# Patient Record
Sex: Female | Born: 1970
Health system: Southern US, Community
[De-identification: ages and names within clinical notes are randomized; demographics above are authoritative.]

## PROBLEM LIST (undated history)

## (undated) DIAGNOSIS — E0590 Thyrotoxicosis, unspecified without thyrotoxic crisis or storm: Secondary | ICD-10-CM

## (undated) DIAGNOSIS — IMO0002 Reserved for concepts with insufficient information to code with codable children: Secondary | ICD-10-CM

## (undated) DIAGNOSIS — M359 Systemic involvement of connective tissue, unspecified: Secondary | ICD-10-CM

## (undated) DIAGNOSIS — L308 Other specified dermatitis: Secondary | ICD-10-CM

## (undated) DIAGNOSIS — M329 Systemic lupus erythematosus, unspecified: Secondary | ICD-10-CM

## (undated) DIAGNOSIS — F419 Anxiety disorder, unspecified: Secondary | ICD-10-CM

## (undated) DIAGNOSIS — E0500 Thyrotoxicosis with diffuse goiter without thyrotoxic crisis or storm: Secondary | ICD-10-CM

## (undated) DIAGNOSIS — G4730 Sleep apnea, unspecified: Secondary | ICD-10-CM

## (undated) DIAGNOSIS — T7840XA Allergy, unspecified, initial encounter: Secondary | ICD-10-CM

## (undated) HISTORY — DX: Anxiety disorder, unspecified: F41.9

## (undated) HISTORY — PX: BREAST REDUCTION SURGERY: SHX8

## (undated) HISTORY — PX: EYE SURGERY: SHX253

## (undated) HISTORY — DX: Thyrotoxicosis with diffuse goiter without thyrotoxic crisis or storm: E05.00

## (undated) HISTORY — PX: ABDOMINAL HYSTERECTOMY: SHX81

## (undated) HISTORY — DX: Allergy, unspecified, initial encounter: T78.40XA

## (undated) HISTORY — PX: BREAST SURGERY: SHX581

## (undated) HISTORY — DX: Sleep apnea, unspecified: G47.30

## (undated) HISTORY — DX: Systemic involvement of connective tissue, unspecified: M35.9

## (undated) HISTORY — PX: KNEE ARTHROSCOPY: SUR90

## (undated) HISTORY — DX: Other specified dermatitis: L30.8

## (undated) HISTORY — PX: TUBAL LIGATION: SHX77

## (undated) HISTORY — PX: HEMORRHOID BANDING: SHX5850

---

## 1996-01-30 HISTORY — PX: SHOULDER SURGERY: SHX246

## 2000-01-14 LAB — HM COLONOSCOPY

## 2002-01-09 ENCOUNTER — Encounter: Payer: Self-pay | Admitting: Emergency Medicine

## 2002-01-09 ENCOUNTER — Emergency Department (HOSPITAL_COMMUNITY): Admission: EM | Admit: 2002-01-09 | Discharge: 2002-01-09 | Payer: Self-pay | Admitting: Emergency Medicine

## 2004-01-21 ENCOUNTER — Ambulatory Visit: Payer: Self-pay | Admitting: Family Medicine

## 2004-10-27 ENCOUNTER — Encounter: Payer: Self-pay | Admitting: Family Medicine

## 2004-10-27 ENCOUNTER — Other Ambulatory Visit: Admission: RE | Admit: 2004-10-27 | Discharge: 2004-10-27 | Payer: Self-pay | Admitting: Family Medicine

## 2004-10-27 ENCOUNTER — Ambulatory Visit: Payer: Self-pay | Admitting: Family Medicine

## 2004-11-01 ENCOUNTER — Ambulatory Visit: Payer: Self-pay | Admitting: Family Medicine

## 2004-12-28 ENCOUNTER — Ambulatory Visit: Payer: Self-pay | Admitting: Family Medicine

## 2005-03-23 ENCOUNTER — Ambulatory Visit: Payer: Self-pay | Admitting: Family Medicine

## 2005-03-30 ENCOUNTER — Emergency Department: Payer: Self-pay | Admitting: Emergency Medicine

## 2005-03-30 ENCOUNTER — Other Ambulatory Visit: Payer: Self-pay

## 2005-05-31 ENCOUNTER — Ambulatory Visit: Payer: Self-pay | Admitting: Family Medicine

## 2005-06-07 ENCOUNTER — Ambulatory Visit: Payer: Self-pay | Admitting: Family Medicine

## 2005-06-08 ENCOUNTER — Ambulatory Visit: Payer: Self-pay | Admitting: Internal Medicine

## 2005-07-23 ENCOUNTER — Ambulatory Visit: Payer: Self-pay | Admitting: Family Medicine

## 2005-08-29 ENCOUNTER — Ambulatory Visit: Payer: Self-pay | Admitting: Family Medicine

## 2005-11-06 ENCOUNTER — Other Ambulatory Visit: Admission: RE | Admit: 2005-11-06 | Discharge: 2005-11-06 | Payer: Self-pay | Admitting: Family Medicine

## 2005-11-06 ENCOUNTER — Encounter: Payer: Self-pay | Admitting: Family Medicine

## 2005-11-06 ENCOUNTER — Ambulatory Visit: Payer: Self-pay | Admitting: Family Medicine

## 2005-11-06 LAB — CONVERTED CEMR LAB: Pap Smear: NORMAL

## 2006-04-01 ENCOUNTER — Ambulatory Visit: Payer: Self-pay | Admitting: Family Medicine

## 2006-04-22 ENCOUNTER — Encounter: Payer: Self-pay | Admitting: Family Medicine

## 2006-04-22 DIAGNOSIS — IMO0001 Reserved for inherently not codable concepts without codable children: Secondary | ICD-10-CM | POA: Insufficient documentation

## 2006-04-22 DIAGNOSIS — K649 Unspecified hemorrhoids: Secondary | ICD-10-CM | POA: Insufficient documentation

## 2006-04-22 DIAGNOSIS — M359 Systemic involvement of connective tissue, unspecified: Secondary | ICD-10-CM

## 2006-04-22 DIAGNOSIS — Z8719 Personal history of other diseases of the digestive system: Secondary | ICD-10-CM

## 2006-04-22 DIAGNOSIS — F411 Generalized anxiety disorder: Secondary | ICD-10-CM | POA: Insufficient documentation

## 2006-04-22 DIAGNOSIS — R55 Syncope and collapse: Secondary | ICD-10-CM | POA: Insufficient documentation

## 2006-04-22 DIAGNOSIS — M329 Systemic lupus erythematosus, unspecified: Secondary | ICD-10-CM | POA: Insufficient documentation

## 2006-04-22 DIAGNOSIS — Z9189 Other specified personal risk factors, not elsewhere classified: Secondary | ICD-10-CM | POA: Insufficient documentation

## 2006-04-22 HISTORY — DX: Personal history of other diseases of the digestive system: Z87.19

## 2006-09-23 ENCOUNTER — Ambulatory Visit: Payer: Self-pay | Admitting: Family Medicine

## 2006-10-09 ENCOUNTER — Encounter: Payer: Self-pay | Admitting: Family Medicine

## 2006-10-28 ENCOUNTER — Ambulatory Visit: Payer: Self-pay | Admitting: Family Medicine

## 2006-11-04 ENCOUNTER — Telehealth: Payer: Self-pay | Admitting: Family Medicine

## 2006-11-22 ENCOUNTER — Ambulatory Visit: Payer: Self-pay | Admitting: Family Medicine

## 2006-11-22 ENCOUNTER — Other Ambulatory Visit: Admission: RE | Admit: 2006-11-22 | Discharge: 2006-11-22 | Payer: Self-pay | Admitting: Family Medicine

## 2006-11-22 ENCOUNTER — Encounter: Payer: Self-pay | Admitting: Family Medicine

## 2006-11-22 DIAGNOSIS — E782 Mixed hyperlipidemia: Secondary | ICD-10-CM | POA: Insufficient documentation

## 2006-11-25 LAB — CONVERTED CEMR LAB
ALT: 11 units/L (ref 0–35)
AST: 16 units/L (ref 0–37)
Albumin: 4 g/dL (ref 3.5–5.2)
Alkaline Phosphatase: 39 units/L (ref 39–117)
BUN: 11 mg/dL (ref 6–23)
Basophils Absolute: 0 10*3/uL (ref 0.0–0.1)
Basophils Relative: 0.1 % (ref 0.0–1.0)
Bilirubin, Direct: 0.2 mg/dL (ref 0.0–0.3)
CO2: 30 meq/L (ref 19–32)
Calcium: 9 mg/dL (ref 8.4–10.5)
Chloride: 105 meq/L (ref 96–112)
Cholesterol: 105 mg/dL (ref 0–200)
Creatinine, Ser: 0.7 mg/dL (ref 0.4–1.2)
Eosinophils Absolute: 0 10*3/uL (ref 0.0–0.6)
Eosinophils Relative: 0.6 % (ref 0.0–5.0)
GFR calc Af Amer: 121 mL/min
GFR calc non Af Amer: 100 mL/min
Glucose, Bld: 98 mg/dL (ref 70–99)
HCT: 40.1 % (ref 36.0–46.0)
HDL: 23.3 mg/dL — ABNORMAL LOW (ref >39.0)
Hemoglobin: 14.1 g/dL (ref 12.0–15.0)
LDL Cholesterol: 65 mg/dL (ref 0–99)
Lymphocytes Relative: 46.6 % — ABNORMAL HIGH (ref 12.0–46.0)
MCHC: 35.2 g/dL (ref 30.0–36.0)
MCV: 92.5 fL (ref 78.0–100.0)
Monocytes Absolute: 0.3 10*3/uL (ref 0.2–0.7)
Monocytes Relative: 8.1 % (ref 3.0–11.0)
Neutro Abs: 1.7 10*3/uL (ref 1.4–7.7)
Neutrophils Relative %: 44.6 % (ref 43.0–77.0)
Platelets: 188 10*3/uL (ref 150–400)
Potassium: 3.9 meq/L (ref 3.5–5.1)
RBC: 4.34 M/uL (ref 3.87–5.11)
RDW: 11.6 % (ref 11.5–14.6)
Sodium: 140 meq/L (ref 135–145)
TSH: 0.63 microintl units/mL (ref 0.35–5.50)
Total Bilirubin: 1.3 mg/dL — ABNORMAL HIGH (ref 0.3–1.2)
Total CHOL/HDL Ratio: 4.5
Total Protein: 6.5 g/dL (ref 6.0–8.3)
Triglycerides: 86 mg/dL (ref 0–149)
VLDL: 17 mg/dL (ref 0–40)
WBC: 3.7 10*3/uL — ABNORMAL LOW (ref 4.5–10.5)

## 2006-11-27 ENCOUNTER — Encounter (INDEPENDENT_AMBULATORY_CARE_PROVIDER_SITE_OTHER): Payer: Self-pay | Admitting: *Deleted

## 2006-11-27 LAB — CONVERTED CEMR LAB: Pap Smear: NORMAL

## 2006-12-25 ENCOUNTER — Encounter: Payer: Self-pay | Admitting: Family Medicine

## 2007-01-20 ENCOUNTER — Telehealth (INDEPENDENT_AMBULATORY_CARE_PROVIDER_SITE_OTHER): Payer: Self-pay | Admitting: Internal Medicine

## 2007-01-20 ENCOUNTER — Encounter: Payer: Self-pay | Admitting: Family Medicine

## 2007-01-20 ENCOUNTER — Ambulatory Visit: Payer: Self-pay | Admitting: Family Medicine

## 2007-06-25 ENCOUNTER — Encounter: Payer: Self-pay | Admitting: Family Medicine

## 2007-08-11 ENCOUNTER — Ambulatory Visit: Payer: Self-pay | Admitting: Family Medicine

## 2007-08-12 ENCOUNTER — Encounter: Payer: Self-pay | Admitting: Family Medicine

## 2007-08-12 ENCOUNTER — Ambulatory Visit: Payer: Self-pay | Admitting: Family Medicine

## 2007-08-12 ENCOUNTER — Telehealth: Payer: Self-pay | Admitting: Family Medicine

## 2007-08-27 ENCOUNTER — Encounter: Payer: Self-pay | Admitting: Family Medicine

## 2007-10-28 ENCOUNTER — Encounter: Payer: Self-pay | Admitting: Family Medicine

## 2007-12-31 ENCOUNTER — Encounter: Payer: Self-pay | Admitting: Family Medicine

## 2008-02-09 ENCOUNTER — Ambulatory Visit: Payer: Self-pay | Admitting: Family Medicine

## 2008-02-13 ENCOUNTER — Telehealth: Payer: Self-pay | Admitting: Family Medicine

## 2008-02-25 ENCOUNTER — Encounter: Payer: Self-pay | Admitting: Family Medicine

## 2008-02-25 ENCOUNTER — Ambulatory Visit: Payer: Self-pay | Admitting: Family Medicine

## 2008-02-25 ENCOUNTER — Other Ambulatory Visit: Admission: RE | Admit: 2008-02-25 | Discharge: 2008-02-25 | Payer: Self-pay | Admitting: Family Medicine

## 2008-02-25 DIAGNOSIS — E559 Vitamin D deficiency, unspecified: Secondary | ICD-10-CM | POA: Insufficient documentation

## 2008-02-26 LAB — CONVERTED CEMR LAB
ALT: 18 units/L (ref 0–35)
AST: 26 units/L (ref 0–37)
Albumin: 4 g/dL (ref 3.5–5.2)
Alkaline Phosphatase: 39 units/L (ref 39–117)
BUN: 8 mg/dL (ref 6–23)
Basophils Absolute: 0 10*3/uL (ref 0.0–0.1)
Basophils Relative: 0 % (ref 0.0–3.0)
Bilirubin, Direct: 0.2 mg/dL (ref 0.0–0.3)
CO2: 30 meq/L (ref 19–32)
Calcium: 8.7 mg/dL (ref 8.4–10.5)
Chloride: 106 meq/L (ref 96–112)
Cholesterol: 132 mg/dL (ref 0–200)
Creatinine, Ser: 0.8 mg/dL (ref 0.4–1.2)
Eosinophils Absolute: 0.1 10*3/uL (ref 0.0–0.7)
Eosinophils Relative: 1 % (ref 0.0–5.0)
GFR calc Af Amer: 103 mL/min
GFR calc non Af Amer: 85 mL/min
Glucose, Bld: 96 mg/dL (ref 70–99)
HCT: 40.7 % (ref 36.0–46.0)
HDL: 24.5 mg/dL — ABNORMAL LOW (ref >39.0)
Hemoglobin: 14.2 g/dL (ref 12.0–15.0)
LDL Cholesterol: 82 mg/dL (ref 0–99)
Lymphocytes Relative: 39.6 % (ref 12.0–46.0)
MCHC: 34.9 g/dL (ref 30.0–36.0)
MCV: 91.7 fL (ref 78.0–100.0)
Monocytes Absolute: 0.4 10*3/uL (ref 0.1–1.0)
Monocytes Relative: 6.8 % (ref 3.0–12.0)
Neutro Abs: 3.1 10*3/uL (ref 1.4–7.7)
Neutrophils Relative %: 52.6 % (ref 43.0–77.0)
Platelets: 204 10*3/uL (ref 150–400)
Potassium: 3.9 meq/L (ref 3.5–5.1)
RBC: 4.44 M/uL (ref 3.87–5.11)
RDW: 11.6 % (ref 11.5–14.6)
Sodium: 138 meq/L (ref 135–145)
TSH: 0.69 microintl units/mL (ref 0.35–5.50)
Total Bilirubin: 1.3 mg/dL — ABNORMAL HIGH (ref 0.3–1.2)
Total CHOL/HDL Ratio: 5.4
Total Protein: 6.8 g/dL (ref 6.0–8.3)
Triglycerides: 129 mg/dL (ref 0–149)
VLDL: 26 mg/dL (ref 0–40)
WBC: 5.9 10*3/uL (ref 4.5–10.5)

## 2008-02-27 ENCOUNTER — Encounter (INDEPENDENT_AMBULATORY_CARE_PROVIDER_SITE_OTHER): Payer: Self-pay | Admitting: *Deleted

## 2008-03-01 LAB — CONVERTED CEMR LAB: Vit D, 1,25-Dihydroxy: 33

## 2008-04-24 ENCOUNTER — Telehealth (INDEPENDENT_AMBULATORY_CARE_PROVIDER_SITE_OTHER): Payer: Self-pay | Admitting: *Deleted

## 2008-04-26 ENCOUNTER — Ambulatory Visit: Payer: Self-pay | Admitting: Family Medicine

## 2008-06-07 ENCOUNTER — Encounter: Payer: Self-pay | Admitting: Family Medicine

## 2008-06-30 ENCOUNTER — Encounter: Payer: Self-pay | Admitting: Family Medicine

## 2008-08-05 ENCOUNTER — Ambulatory Visit: Payer: Self-pay | Admitting: Family Medicine

## 2008-08-08 ENCOUNTER — Telehealth: Payer: Self-pay | Admitting: Family Medicine

## 2008-08-09 ENCOUNTER — Ambulatory Visit: Payer: Self-pay | Admitting: Family Medicine

## 2008-08-13 ENCOUNTER — Telehealth: Payer: Self-pay | Admitting: Family Medicine

## 2008-09-02 ENCOUNTER — Encounter: Payer: Self-pay | Admitting: Family Medicine

## 2009-01-25 ENCOUNTER — Ambulatory Visit: Payer: Self-pay | Admitting: Family Medicine

## 2009-03-01 ENCOUNTER — Ambulatory Visit: Payer: Self-pay | Admitting: Family Medicine

## 2009-03-02 LAB — CONVERTED CEMR LAB
ALT: 11 units/L (ref 0–35)
AST: 16 units/L (ref 0–37)
Albumin: 4.2 g/dL (ref 3.5–5.2)
Alkaline Phosphatase: 42 units/L (ref 39–117)
BUN: 9 mg/dL (ref 6–23)
Basophils Absolute: 0 10*3/uL (ref 0.0–0.1)
Basophils Relative: 0.3 % (ref 0.0–3.0)
Bilirubin, Direct: 0.2 mg/dL (ref 0.0–0.3)
CO2: 30 meq/L (ref 19–32)
Calcium: 8.9 mg/dL (ref 8.4–10.5)
Chloride: 109 meq/L (ref 96–112)
Cholesterol: 130 mg/dL (ref 0–200)
Creatinine, Ser: 0.7 mg/dL (ref 0.4–1.2)
Eosinophils Absolute: 0.1 10*3/uL (ref 0.0–0.7)
Eosinophils Relative: 1.2 % (ref 0.0–5.0)
GFR calc non Af Amer: 98.44 mL/min (ref >60)
Glucose, Bld: 98 mg/dL (ref 70–99)
HCT: 43.8 % (ref 36.0–46.0)
HDL: 33.2 mg/dL — ABNORMAL LOW (ref >39.00)
Hemoglobin: 14.5 g/dL (ref 12.0–15.0)
LDL Cholesterol: 70 mg/dL (ref 0–99)
Lymphocytes Relative: 35.3 % (ref 12.0–46.0)
Lymphs Abs: 2 10*3/uL (ref 0.7–4.0)
MCHC: 33.2 g/dL (ref 30.0–36.0)
MCV: 95.1 fL (ref 78.0–100.0)
Monocytes Absolute: 0.3 10*3/uL (ref 0.1–1.0)
Monocytes Relative: 5.7 % (ref 3.0–12.0)
Neutro Abs: 3.4 10*3/uL (ref 1.4–7.7)
Neutrophils Relative %: 57.5 % (ref 43.0–77.0)
Platelets: 230 10*3/uL (ref 150.0–400.0)
Potassium: 4.1 meq/L (ref 3.5–5.1)
RBC: 4.61 M/uL (ref 3.87–5.11)
RDW: 12 % (ref 11.5–14.6)
Sodium: 141 meq/L (ref 135–145)
TSH: 0.76 microintl units/mL (ref 0.35–5.50)
Total Bilirubin: 0.6 mg/dL (ref 0.3–1.2)
Total CHOL/HDL Ratio: 4
Total Protein: 7 g/dL (ref 6.0–8.3)
Triglycerides: 132 mg/dL (ref 0.0–149.0)
VLDL: 26.4 mg/dL (ref 0.0–40.0)
WBC: 5.8 10*3/uL (ref 4.5–10.5)

## 2009-03-03 ENCOUNTER — Ambulatory Visit: Payer: Self-pay | Admitting: Family Medicine

## 2009-03-03 ENCOUNTER — Encounter: Payer: Self-pay | Admitting: Family Medicine

## 2009-03-03 LAB — CONVERTED CEMR LAB: Vit D, 25-Hydroxy: 36 ng/mL (ref 30–89)

## 2009-03-08 ENCOUNTER — Ambulatory Visit: Payer: Self-pay | Admitting: Family Medicine

## 2009-03-08 ENCOUNTER — Encounter: Payer: Self-pay | Admitting: Family Medicine

## 2009-03-10 ENCOUNTER — Encounter (INDEPENDENT_AMBULATORY_CARE_PROVIDER_SITE_OTHER): Payer: Self-pay | Admitting: *Deleted

## 2009-03-29 LAB — CYTOLOGY - PAP: Pap Smear: NORMAL

## 2009-05-24 ENCOUNTER — Ambulatory Visit: Payer: Self-pay | Admitting: Family Medicine

## 2009-08-02 ENCOUNTER — Ambulatory Visit: Payer: Self-pay | Admitting: Family Medicine

## 2009-08-05 ENCOUNTER — Encounter: Payer: Self-pay | Admitting: Family Medicine

## 2009-08-05 ENCOUNTER — Telehealth: Payer: Self-pay | Admitting: Family Medicine

## 2009-12-07 ENCOUNTER — Ambulatory Visit: Payer: Self-pay | Admitting: Internal Medicine

## 2010-02-26 LAB — CONVERTED CEMR LAB
EBV NA IgG: 2.16 — ABNORMAL HIGH
EBV VCA IgG: 6.36 — ABNORMAL HIGH
EBV VCA IgM: 0.11
Rapid Strep: NEGATIVE

## 2010-02-28 NOTE — Assessment & Plan Note (Signed)
Summary: ?STREP  Medications Added PENICILLIN V POTASSIUM 500 MG  TABS (PENICILLIN V POTASSIUM) 1 tab by mouth three times a day x 10 days        Vital Signs:  Patient Profile:   40 Years Old Female Weight:      142 pounds Temp:     98.7 degrees F oral Pulse rate:   76 / minute Pulse rhythm:   regular BP sitting:   110 / 60  (left arm) Cuff size:   regular  Vitals Entered ByProvidence Crosby (October 28, 2006 2:40 PM)                 Chief Complaint:  SORE THROAT // FEVER.  Acute Visit History:      The patient complains of earache, fever, headache, nausea, and sore throat.  These symptoms began one day ago.  She denies cough.  Other comments include: sore throat worse on the left side 2 young children.        Her highest temperature has been 102.        The earache is located on the left side.        She has had dysphagia associated with the sore throat.  There is no history of recent exposure to strep.         Current Allergies (reviewed today): MORPHINE  Past Medical History:    Reviewed history from 04/22/2006 and no changes required:       Anxiety     Review of Systems      See HPI   Physical Exam  General:     Well-developed,well-nourished,in no acute distress; alert,appropriate and cooperative throughout examination Eyes:     No corneal or conjunctival inflammation noted. EOMI. Perrla. . Vision grossly normal. Ears:     External ear exam shows no significant lesions or deformities.  Otoscopic examination reveals clear canals, tympanic membranes are intact bilaterally without bulging, retraction, inflammation or discharge. Hearing is grossly normal bilaterally. Nose:     External nasal examination shows no deformity or inflammation. Nasal mucosa are pink and moist without lesions or exudates. Mouth:     pharyngeal erythema and  pharyngeal exudate on L.   Lungs:     Normal respiratory effort, chest expands symmetrically. Lungs are clear to  auscultation, no crackles or wheezes. Heart:     Normal rate and regular rhythm. S1 and S2 normal without gallop, murmur, click, rub or other extra sounds. Cervical Nodes:     L anterior LN tender.      Impression & Recommendations:  Problem # 1:  PHARYNGITIS-ACUTE (ICD-462) High clinical suspicion given symptoms of acute bacterial pharyngitis.  Despite neg strp test.  Will treat with antibiotics to cover strep.  Ibuprofen as needed sore throat Her updated medication list for this problem includes:    Penicillin V Potassium 500 Mg Tabs (Penicillin v potassium) .Marland Kitchen... 1 tab by mouth three times a day x 10 days   Complete Medication List: 1)  Hydroxychloroquine Sulfate 200 Mg Tabs (Hydroxychloroquine sulfate) .... One by mouth bid 2)  Penicillin V Potassium 500 Mg Tabs (Penicillin v potassium) .Marland Kitchen.. 1 tab by mouth three times a day x 10 days  Other Orders: Rapid Strep (78295)     Prescriptions: PENICILLIN V POTASSIUM 500 MG  TABS (PENICILLIN V POTASSIUM) 1 tab by mouth three times a day x 10 days  #30 x 0   Entered and Authorized by:   Kerby Nora MD  Signed by:   Kerby Nora MD on 10/28/2006   Method used:   [facility] ...       [facility] [address]       [address]       [place], [place]  [address]       Ph: [phone] or [phone]       Fax: [phone]   RxID:   [id number]  ]

## 2010-02-28 NOTE — Assessment & Plan Note (Signed)
Summary: TB TEST/Caroline Mullen/CLE    Nurse Visit   Allergies: 1)  Morphine  Immunizations Administered:  PPD Skin Test:    Vaccine Type: PPD    Site: right forearm    Mfr: Sanofi Pasteur    Dose: 0.1 ml    Route: ID    Given by: Linde Gillis CMA (AAMA)    Exp. Date: 11/11/2010    Lot #: [id number]  PPD Results    Date of reading: 05/26/2009    Results: < 5mm    Interpretation: negative  Orders Added: 1)  TB Skin Test [86580] 2)  Admin 1st Vaccine [66440]

## 2010-02-28 NOTE — Letter (Signed)
Summary: Medical Exam/[facility]  Medical Exam/[place] [facility]   Imported By: Maryln Gottron 06/02/2009 11:38:21  _____________________________________________________________________  External Attachment:    Type:   Image     Comment:   External Document

## 2010-02-28 NOTE — Progress Notes (Signed)
Summary: wants cough meds.   Phone Note Call from Patient Call back at [phone]   Caller: Patient Call For: Judith Part MD Summary of Call: Patient was just seen on tuesday. She said at the time of visit she really didn't need cough med. She is now asking if she can have have [facility] [place]. Her cough has gotten worse and wants something to help over the weekend. Initial call taken by: Melody Comas,  August 05, 2009 4:05 PM  Follow-up for Phone Call        please call in robitussin AC 1-2 tsp by mouth up to every 4-6 hours as needed cough #120cc no ref use caution- this has codiene in it and can sedate Follow-up by: Judith Part MD,  August 05, 2009 5:04 PM  Additional Follow-up for Phone Call Additional follow up Details #1::        Patient notified as instructed by telephone. Medication phoned [facility] as instructed. Lewanda Rife LPN  August 06, 4538 5:18 PM

## 2010-02-28 NOTE — Assessment & Plan Note (Signed)
Summary: EAR,?SINUS INFECTION/CLE   Vital Signs:  Patient profile:   40 year old female Height:      66 inches Weight:      147.75 pounds BMI:     23.93 Temp:     98.2 degrees F oral Pulse rate:   76 / minute Pulse rhythm:   regular BP sitting:   116 / 78  (left arm) Cuff size:   regular  Vitals Entered By: Lewanda Rife LPN (August 03, 5782 12:18 PM) CC: ?sinus infection, has drainage at back of throat, head congested and when blows nose yellow mucus. Rt earache and productive cough ? color mucus   History of Present Illness: started with shooting pains in L ear  by weekend sinus congestion and sick  now R ear pain  sinus pain too- worse below her eyes  yellow d/c  felt flulike with body aches yesterday - thinks she had a fever  throat was sore yesterday am -- had to sleep sitting up last night   is taking otc med - advil cold and sinus  not enough fluids   Allergies: 1)  Morphine  Past History:  Past Medical History: Last updated: 03-23-2008 Anxiety connective tissue disease- on plaquenil   rheum---[facility]   Past Surgical History: Last updated: 03-23-08 Caesarean section BTL Shoulder surgery 07/07/1996) Knee arthroscopy Hysterectomy- fibroid uterine tumors (supracervical) Colonoscopy- int. and external hemorrhoids, diverticula CT of abd and pelvis- negative (07-07-2005) CT of head- neg. 07-07-04), MRI   Family History: Last updated: 03/23/08 F skin ca- basal cell (died 08-Jul-2002) M DM HTN, low bone density  gm HTN GF colon ca  Social History: Last updated: 03-23-2008 no alcohol non smoker  vegan diet - intermittent daughter grad hs - Jul 07, 2008  Risk Factors: Smoking Status: quit (04/22/2006)  Review of Systems General:  Complains of chills, fatigue, fever, loss of appetite, and malaise. Eyes:  Denies blurring and discharge. ENT:  Complains of earache, nasal congestion, postnasal drainage, sinus pressure, and sore throat. CV:  Denies chest pain or  discomfort and palpitations. Resp:  Complains of cough; denies wheezing. Derm:  Denies itching and rash.  Physical Exam  General:  Well-developed,well-nourished,in no acute distress; alert,appropriate and cooperative throughout examination Head:  normocephalic, atraumatic, and no abnormalities observed.  some sinus pain worse maxillary  Eyes:  vision grossly intact, pupils equal, pupils round, and pupils reactive to light.  no conjunctival pallor, injection or icterus  Ears:  R ear normal and L ear normal.   Nose:  nares are congested and injected bilat  Mouth:  pharynx pink and moist, no erythema, and no exudates.   Neck:  supple with full rom and no masses or thyromegally, no JVD or carotid bruit  Lungs:  Normal respiratory effort, chest expands symmetrically. Lungs are clear to auscultation, no crackles or wheezes. Heart:  Normal rate and regular rhythm. S1 and S2 normal without gallop, murmur, click, rub or other extra sounds. Skin:  Intact without suspicious lesions or rashes Cervical Nodes:  No lymphadenopathy noted Psych:  normal affect, talkative and pleasant    Impression & Recommendations:  Problem # 1:  SINUSITIS - ACUTE-NOS (ICD-461.9) Assessment New  new sinusitis with pain and fever and congestion  will tx with augmentin and update  recommend sympt care- see pt instructions   pt advised to update me if symptoms worsen or do not improve - esp if sinus pain or fever  or no imp in 10 days  Her updated medication  list for this problem includes:    Advil Cold/sinus 30-200 Mg Tabs (Pseudoephedrine-ibuprofen) ..... Otc as directed.    Augmentin 875-125 Mg Tabs (Amoxicillin-pot clavulanate) .Marland Kitchen... 1 by mouth two times a day for 10 days  Orders: Prescription Created Electronically [phone])  Complete Medication List: 1)  Hydroxychloroquine Sulfate 200 Mg Tabs (Hydroxychloroquine sulfate) .... One by mouth twice a day as needed 2)  Vitamin D 2000 Unit Tabs (Cholecalciferol)  .... Take daily by mouth 3)  Advil Cold/sinus 30-200 Mg Tabs (Pseudoephedrine-ibuprofen) .... Otc as directed. 4)  Augmentin 875-125 Mg Tabs (Amoxicillin-pot clavulanate) .Marland Kitchen.. 1 by mouth two times a day for 10 days  Patient Instructions: 1)  you can try mucinex over the counter twice daily as directed and nasal saline spray for congestion 2)  tylenol over the counter as directed may help with aches, headache and fever 3)  call if symptoms worsen or if not improved in 4-5 days  4)  take the augmentin as directed  5)  call back if you feel you need cough medicine  Prescriptions: AUGMENTIN 875-125 MG TABS (AMOXICILLIN-POT CLAVULANATE) 1 by mouth two times a day for 10 days  #20 x 0   Entered and Authorized by:   Judith Part MD   Signed by:   Judith Part MD on 08/02/2009   Method used:   [facility]        [id number]       [address]       [place], [place]  [address]       Ph: [phone]       Fax: [phone]   RxID:   [id number]   Current Allergies (reviewed today): MORPHINE

## 2010-02-28 NOTE — Letter (Signed)
Summary: Results Follow up Letter  [place] [facility]  [address] [place], [place] [address]   Phone: [phone]  Fax: [phone]    03/10/2009 MRN: [id number]    Joretta Bin [address] [place], [place]  [address]    Dear Ms. Tankard,  The following are the results of your recent test(s):  Test         Result    Pap Smear:        Normal _____  Not Normal _____ Comments: ______________________________________________________ Cholesterol: LDL(Bad cholesterol):         Your goal is less than:         HDL (Good cholesterol):       Your goal is more than: Comments:  ______________________________________________________ Mammogram:        Normal __X___  Not Normal _____ Comments:   Follow up films/ mammogram are normal . Next screening mammogram 1 year please.  ___________________________________________________________________ Hemoccult:        Normal _____  Not normal _______ Comments:    _____________________________________________________________________ Other Tests:    We routinely do not discuss normal results over the telephone.  If you desire a copy of the results, or you have any questions about this information we can discuss them at your next office visit.   Sincerely,    Marne A. Milinda Antis, M.D.  MAT:lsf

## 2010-02-28 NOTE — Assessment & Plan Note (Signed)
Summary: CPX/DLO   Vital Signs:  Patient profile:   40 year old female Height:      66 inches Weight:      145 pounds BMI:     23.49 Temp:     97.8 degrees F oral Pulse rate:   72 / minute Pulse rhythm:   regular BP sitting:   106 / 70  (left arm) Cuff size:   regular  Vitals Entered By: Liane Comber CMA Duncan Dull) (March 01, 2009 9:23 AM) CC: CPX   History of Present Illness: here for health mt exam  is generally doing ok   needs to get her eyes checked -- thinks her px has changed  trouble seeing close up / some eye fatigue (has reading glasses)  is also on plaquenil  sees Dr Fransico Michael  wt is down 5 lb with bmi of 23  hx supracervical hysterectomy nl pap last year  has had 3 normal paps since her hyst - no gyn symptoms    had baseline mam 06 needs to start annual self exam -- feels a very sore area on R lateral breast , ? if a little swollen , no skin change  sore for 3 weeks   had colonosc in 01  Td 06 flu shot - did not have it yet  pneumovax  Allergies: 1)  Morphine  Comments:  Nurse/Medical Assistant: Liane Comber CMA Duncan Dull) (March 01, 2009 9:27 AM) The patient's medications and allergies were reviewed with the patient and were updated in the Medication and Allergy Lists.  Past History:  Past Medical History: Last updated: 03/24/08 Anxiety connective tissue disease- on plaquenil   rheum---[facility]   Past Surgical History: Last updated: 03-24-2008 Caesarean section BTL Shoulder surgery 1996/07/08) Knee arthroscopy Hysterectomy- fibroid uterine tumors (supracervical) Colonoscopy- int. and external hemorrhoids, diverticula CT of abd and pelvis- negative (2005/07/08) CT of head- neg. 07/08/04), MRI   Family History: Last updated: March 24, 2008 F skin ca- basal cell (died 07-09-02) M DM HTN, low bone density  gm HTN GF colon ca  Social History: Last updated: 2008-03-24 no alcohol non smoker  vegan diet - intermittent daughter grad hs  - 07-08-2008  Risk Factors: Smoking Status: quit (04/22/2006)  Review of Systems General:  Complains of fatigue; denies chills, fever, and loss of appetite. Eyes:  Denies blurring and eye irritation. CV:  Denies chest pain or discomfort, palpitations, and shortness of breath with exertion. Resp:  Denies cough and wheezing. GI:  Denies abdominal pain, change in bowel habits, and indigestion. GU:  Denies abnormal vaginal bleeding, discharge, and dysuria. MS:  Complains of joint pain, muscle aches, and stiffness; denies cramps and muscle weakness. Derm:  Denies lesion(s), poor wound healing, and rash. Neuro:  Denies numbness and tingling. Psych:  Denies anxiety and depression. Endo:  Denies cold intolerance, excessive thirst, excessive urination, and heat intolerance. Heme:  Denies abnormal bruising and bleeding.  Physical Exam  General:  Well-developed,well-nourished,in no acute distress; alert,appropriate and cooperative throughout examination Head:  normocephalic, atraumatic, and no abnormalities observed.   Eyes:  vision grossly intact, pupils equal, pupils round, pupils reactive to light, and no injection.   Ears:  R ear normal and L ear normal.   Nose:  no nasal discharge.   Mouth:  pharynx pink and moist.   Neck:  supple with full rom and no masses or thyromegally, no JVD or carotid bruit  Chest Wall:  No deformities, masses, or tenderness noted. Breasts:  pt is tender in  upper/outer quad of R breast with some fullness/ but no focal mass bilat dense/ symmetric / no skin change or nipple d/c  Lungs:  Normal respiratory effort, chest expands symmetrically. Lungs are clear to auscultation, no crackles or wheezes. Heart:  Normal rate and regular rhythm. S1 and S2 normal without gallop, murmur, click, rub or other extra sounds. Abdomen:  Bowel sounds positive,abdomen soft and non-tender without masses, organomegaly or hernias noted. Msk:  No deformity or scoliosis noted of thoracic or  lumbar spine.  no acute joint changes  Pulses:  R and L carotid,radial,femoral,dorsalis pedis and posterior tibial pulses are full and equal bilaterally Extremities:  No clubbing, cyanosis, edema, or deformity noted with normal full range of motion of all joints.   Neurologic:  sensation intact to light touch, gait normal, and DTRs symmetrical and normal.   Skin:  Intact without suspicious lesions or rashes some lentigos and stable nevi Cervical Nodes:  No lymphadenopathy noted Axillary Nodes:  No palpable lymphadenopathy Inguinal Nodes:  No significant adenopathy Psych:  normal affect, talkative and pleasant    Impression & Recommendations:  Problem # 1:  HEALTH MAINTENANCE EXAM (ICD-V70.0) Assessment Comment Only  reviewed health habits including diet, exercise and skin cancer prevention reviewed health maintenance list and family history  will skip gyn exam-- has had 3 nl paps in a row  labs today flu shot  Orders: Venipuncture ([address]) TLB-Lipid Panel (80061-LIPID) TLB-BMP (Basic Metabolic Panel-BMET) (80048-METABOL) TLB-CBC Platelet - w/Differential (85025-CBCD) TLB-Hepatic/Liver Function Pnl (80076-HEPATIC) TLB-TSH (Thyroid Stimulating Hormone) (84443-TSH)  Problem # 2:  UNSPECIFIED VITAMIN D DEFICIENCY (ICD-268.9) Assessment: Unchanged pt has not been very compliant with it  check level and adv  urged to take her 2000 international units daily Orders: T-Vitamin D (25-Hydroxy) (60454-09811) Specimen Handling (91478)  Problem # 3:  HYPERLIPIDEMIA (ICD-272.2) Assessment: Unchanged  last check very good with better diet lab today and adv   Labs Reviewed: SGOT: 26 (02/25/2008)   SGPT: 18 (02/25/2008)   HDL:24.5 (02/25/2008), 23.3 (11/22/2006)  LDL:82 (02/25/2008), 65 (11/22/2006)  Chol:132 (02/25/2008), 105 (11/22/2006)  Trig:129 (02/25/2008), 86 (11/22/2006)  Problem # 4:  CONNECTIVE TISSUE DISEASE (ICD-710.9) Assessment: Comment Only overall stable on  plaquenil  Problem # 5:  BREAST TENDERNESS (ICD-611.71) Assessment: New focally on R lateral breast- tissue ridge palpated (? if poss deep cyst) also needs first screening will update with result  adv to avoid caff Orders: Radiology Referral (Radiology)  Complete Medication List: 1)  Hydroxychloroquine Sulfate 200 Mg Tabs (Hydroxychloroquine sulfate) .... One by mouth twice a day as needed 2)  Vitamin D 2000 Unit Tabs (Cholecalciferol) .... Take daily by mouth  Other Orders: Admin 1st Vaccine (29562) Flu Vaccine 93yrs + [phone])  Patient Instructions: 1)  don't forget to make appt for your eye exam 2)  we will schedule mammogram at check out  3)  keep working on healthy diet and exercise  4)  labs today     Flu Vaccine Consent Questions     Do you have a history of severe allergic reactions to this vaccine? no    Any prior history of allergic reactions to egg and/or gelatin? no    Do you have a sensitivity to the preservative Thimersol? no    Do you have a past history of Guillan-Barre Syndrome? no    Do you currently have an acute febrile illness? no    Have you ever had a severe reaction to latex? no    Vaccine information given and explained to  patient? yes    Are you currently pregnant? no    Lot Number:[id number]   Exp Date:07/28/2009   Site Given  Left Deltoid IM     .Marland Kitchenlbflu

## 2010-02-28 NOTE — Progress Notes (Signed)
Summary: [facility]/Rheumatology Clinic Note/Dr. Alene Mires  [facility]/Rhe[facility]ology Clinic Note/Dr. Alene Mires   Imported By: Eleonore Chiquito 12/31/2006 09:22:26  _____________________________________________________________________  External Attachment:    Type:   Image     Comment:   External Document

## 2010-02-28 NOTE — Miscellaneous (Signed)
Summary: Med list update Robitussin AC  Medications Added * ROBITUSSIN AC take 1-2 tsp by mouth every 4-6 hrs as needed for cough. Caution:this has codeine in it and can sedate.       Clinical Lists Changes  Medications: Added new medication of * ROBITUSSIN AC take 1-2 tsp by mouth every 4-6 hrs as needed for cough. Caution:this has codeine in it and can sedate.     Current Allergies: MORPHINE

## 2010-02-28 NOTE — Assessment & Plan Note (Signed)
Summary: ?SINUS INFECTION/CLE   Vital Signs:  Patient profile:   40 year old female Height:      66 inches Weight:      155.25 pounds BMI:     25.15 Temp:     98.4 degrees F oral Pulse rate:   92 / minute Pulse rhythm:   regular BP sitting:   130 / 84  (left arm) Cuff size:   regular  Vitals Entered By: Delilah Shan CMA Duncan Dull) (December 07, 2009 12:34 PM) CC: ? sinus infection   History of Present Illness: CC: ? sinus infx  5-6 d h/o hoarseness, sinus drainage and PNdrip, and facial pain/pressure.  + nasal congestion and RN.  + ears hurting but not anymore.  + cough, dry.  Tried advil cold/sinus, amoxicillin x 2 days.  No nasal saline.  + tmax 99  No abd pain, fevers/chills, n/v, rashes, joint pains or muscle pains.  No sick contacts at home, no smokers at home.  + h/o sinus infections in past, 2/year.    Current Medications (verified): 1)  Advil Cold/sinus 30-200 Mg Tabs (Pseudoephedrine-Ibuprofen) .... Otc As Directed.  Allergies: 1)  Morphine  Past History:  Past Medical History: Last updated: 02/25/2008 Anxiety connective tissue disease- on plaquenil   rheum---[facility]   Social History: Last updated: 02/25/2008 no alcohol non smoker  vegan diet - intermittent daughter grad hs - 2010  Review of Systems       per HPI  Physical Exam  General:  Well-developed,well-nourished,in no acute distress; alert,appropriate and cooperative throughout examination.  + hoarsevoice, cough Head:  normocephalic, atraumatic, and no abnormalities observed.  + frontal and maxillary sinus pain bilaterally, worse maxillary.  + maxillary erythema Eyes:  vision grossly intact, pupils equal, pupils round, and pupils reactive to light.  no conjunctival pallor, injection or icterus  Ears:  L TM clear R TM with seroud fluid behind Nose:  nares are congested and injected bilat  Mouth:  pharynx pink and moist, no erythema, and no exudates.  + congestion, drainage. Neck:   supple with full rom and no masses or thyromegally, no JVD or carotid bruit  Lungs:  Normal respiratory effort, chest expands symmetrically. Lungs are clear to auscultation, no crackles or wheezes.  + cough Heart:  Normal rate and regular rhythm. S1 and S2 normal without gallop, murmur, click, rub or other extra sounds. Pulses:  2+ rad pulses Extremities:  brisk cap refill Skin:  Intact without suspicious lesions or rashes   Impression & Recommendations:  Problem # 1:  SINUSITIS - ACUTE-NOS (ICD-461.9) Instructed on treatment. Call if symptoms persist or worsen.  going on for 6 days but evidence of sinusitis on exam and story.  Treat with guaifenesin as mucolytic, nasal saline, and 10 d course amox.  tussionex for night.  red flags to return discussed.  The following medications were removed from the medication list:    Augmentin 875-125 Mg Tabs (Amoxicillin-pot clavulanate) .Marland Kitchen... 1 by mouth two times a day for 10 days Her updated medication list for this problem includes:    Advil Cold/sinus 30-200 Mg Tabs (Pseudoephedrine-ibuprofen) ..... Otc as directed.    Amoxicillin 875 Mg Tabs (Amoxicillin) .Marland Kitchen... Take one by mouth two times a day    Tussionex Pennkinetic Er 10-8 Mg/21ml Lqcr (Hydrocod polst-chlorphen polst) ..... One teaspoon by mouth two times a day as needed cough (mainly for nighttime)  Complete Medication List: 1)  Advil Cold/sinus 30-200 Mg Tabs (Pseudoephedrine-ibuprofen) .... Otc as directed. 2)  Amoxicillin 875 Mg Tabs (Amoxicillin) .... Take one by mouth two times a day 3)  Tussionex Pennkinetic Er 10-8 Mg/51ml Lqcr (Hydrocod polst-chlorphen polst) .... One teaspoon by mouth two times a day as needed cough (mainly for nighttime)  Patient Instructions: 1)  You have a sinus infection. 2)  Take medicines as prescribed: amoxicillin 875mg  twice daily for 10 days.  tussionex for cough at night. 3)  Take guaifenesin 400mg  IR 1 1/2 pills in am and at noon with plenty of fluid to  help mobilize mucous. 4)  Use nasal saline spray or neti pot to help drainage of sinuses. 5)  If you start having fevers >101.5, trouble swallowing or breathing, or are worsening instead of improving as expected, you may need to be seen again. 6)  Good to see you today, call clinic with questions.  Prescriptions: TUSSIONEX PENNKINETIC ER 10-8 MG/5ML LQCR (HYDROCOD POLST-CHLORPHEN POLST) one teaspoon by mouth two times a day as needed cough (mainly for nighttime)  #100cc x 0   Entered and Authorized by:   Eustaquio Boyden  MD   Signed by:   Eustaquio Boyden  MD on 12/07/2009   Method used:   Print then Give to Patient   RxID:   [id number] AMOXICILLIN 875 MG TABS (AMOXICILLIN) take one by mouth two times a day  #20 x 0   Entered and Authorized by:   Eustaquio Boyden  MD   Signed by:   Eustaquio Boyden  MD on 12/07/2009   Method used:   [facility]  [address] [phone][facility] ([facility]ail[facility]       [address]       [place], [place]  [address]       Ph: [phone]       Fax: [phone]   RxID:   [id number]    Orders Added: 1)  Est. Patient Level III [34742]    Current Allergies (reviewed today): MORPHINE

## 2010-02-28 NOTE — Miscellaneous (Signed)
Summary: pap results   Clinical Lists Changes  Observations: Added new observation of PAP SMEAR: normal (11/27/2006 8:05)       Preventive Care Screening  Pap Smear:    Date:  11/27/2006    Results:  normal

## 2010-03-30 ENCOUNTER — Telehealth (INDEPENDENT_AMBULATORY_CARE_PROVIDER_SITE_OTHER): Payer: Self-pay | Admitting: *Deleted

## 2010-03-30 ENCOUNTER — Other Ambulatory Visit: Payer: Self-pay | Admitting: Family Medicine

## 2010-03-30 ENCOUNTER — Encounter (INDEPENDENT_AMBULATORY_CARE_PROVIDER_SITE_OTHER): Payer: Self-pay | Admitting: *Deleted

## 2010-03-30 ENCOUNTER — Other Ambulatory Visit (INDEPENDENT_AMBULATORY_CARE_PROVIDER_SITE_OTHER): Payer: Self-pay

## 2010-03-30 DIAGNOSIS — E559 Vitamin D deficiency, unspecified: Secondary | ICD-10-CM

## 2010-03-30 DIAGNOSIS — Z0000 Encounter for general adult medical examination without abnormal findings: Secondary | ICD-10-CM

## 2010-03-30 DIAGNOSIS — E782 Mixed hyperlipidemia: Secondary | ICD-10-CM

## 2010-03-30 LAB — CBC WITH DIFFERENTIAL/PLATELET
Basophils Absolute: 0 10*3/uL (ref 0.0–0.1)
Basophils Relative: 0.4 % (ref 0.0–3.0)
Eosinophils Absolute: 0.1 10*3/uL (ref 0.0–0.7)
Eosinophils Relative: 1.3 % (ref 0.0–5.0)
HCT: 42.5 % (ref 36.0–46.0)
Hemoglobin: 14.6 g/dL (ref 12.0–15.0)
Lymphocytes Relative: 39.3 % (ref 12.0–46.0)
Lymphs Abs: 2.1 10*3/uL (ref 0.7–4.0)
MCHC: 34.2 g/dL (ref 30.0–36.0)
MCV: 94.1 fl (ref 78.0–100.0)
Monocytes Absolute: 0.4 10*3/uL (ref 0.1–1.0)
Monocytes Relative: 7.6 % (ref 3.0–12.0)
Neutro Abs: 2.7 10*3/uL (ref 1.4–7.7)
Neutrophils Relative %: 51.4 % (ref 43.0–77.0)
Platelets: 183 10*3/uL (ref 150.0–400.0)
RBC: 4.52 Mil/uL (ref 3.87–5.11)
RDW: 13.6 % (ref 11.5–14.6)
WBC: 5.3 10*3/uL (ref 4.5–10.5)

## 2010-03-30 LAB — HEPATIC FUNCTION PANEL
ALT: 13 U/L (ref 0–35)
AST: 15 U/L (ref 0–37)
Albumin: 3.9 g/dL (ref 3.5–5.2)
Alkaline Phosphatase: 32 U/L — ABNORMAL LOW (ref 39–117)
Bilirubin, Direct: 0.2 mg/dL (ref 0.0–0.3)
Total Bilirubin: 1.1 mg/dL (ref 0.3–1.2)
Total Protein: 6.1 g/dL (ref 6.0–8.3)

## 2010-03-30 LAB — LIPID PANEL
Cholesterol: 119 mg/dL (ref 0–200)
HDL: 30.5 mg/dL — ABNORMAL LOW (ref >39.00)
LDL Cholesterol: 76 mg/dL (ref 0–99)
Total CHOL/HDL Ratio: 4
Triglycerides: 65 mg/dL (ref 0.0–149.0)
VLDL: 13 mg/dL (ref 0.0–40.0)

## 2010-03-30 LAB — TSH: TSH: 0.45 u[IU]/mL (ref 0.35–5.50)

## 2010-03-30 LAB — BASIC METABOLIC PANEL
BUN: 10 mg/dL (ref 6–23)
CO2: 28 mEq/L (ref 19–32)
Calcium: 8.7 mg/dL (ref 8.4–10.5)
Chloride: 103 mEq/L (ref 96–112)
Creatinine, Ser: 0.8 mg/dL (ref 0.4–1.2)
GFR: 87.72 mL/min (ref >60.00)
Glucose, Bld: 94 mg/dL (ref 70–99)
Potassium: 4.1 mEq/L (ref 3.5–5.1)
Sodium: 140 mEq/L (ref 135–145)

## 2010-03-31 ENCOUNTER — Encounter: Payer: Self-pay | Admitting: Family Medicine

## 2010-03-31 LAB — CONVERTED CEMR LAB: Vit D, 25-Hydroxy: 33 ng/mL (ref 30–89)

## 2010-04-04 ENCOUNTER — Other Ambulatory Visit: Payer: Self-pay | Admitting: Family Medicine

## 2010-04-04 ENCOUNTER — Encounter (INDEPENDENT_AMBULATORY_CARE_PROVIDER_SITE_OTHER): Payer: BC Managed Care – PPO | Admitting: Family Medicine

## 2010-04-04 ENCOUNTER — Other Ambulatory Visit (HOSPITAL_COMMUNITY)
Admission: RE | Admit: 2010-04-04 | Discharge: 2010-04-04 | Disposition: A | Discharge status: Home / Self Care | Payer: BC Managed Care – PPO | Source: Ambulatory Visit | Attending: Family Medicine | Admitting: Family Medicine

## 2010-04-04 ENCOUNTER — Encounter: Payer: Self-pay | Admitting: Family Medicine

## 2010-04-04 DIAGNOSIS — G43109 Migraine with aura, not intractable, without status migrainosus: Secondary | ICD-10-CM

## 2010-04-04 DIAGNOSIS — Z124 Encounter for screening for malignant neoplasm of cervix: Secondary | ICD-10-CM | POA: Insufficient documentation

## 2010-04-04 DIAGNOSIS — Z0000 Encounter for general adult medical examination without abnormal findings: Secondary | ICD-10-CM

## 2010-04-04 DIAGNOSIS — M359 Systemic involvement of connective tissue, unspecified: Secondary | ICD-10-CM

## 2010-04-04 DIAGNOSIS — E559 Vitamin D deficiency, unspecified: Secondary | ICD-10-CM

## 2010-04-04 DIAGNOSIS — Z1231 Encounter for screening mammogram for malignant neoplasm of breast: Secondary | ICD-10-CM

## 2010-04-04 DIAGNOSIS — E782 Mixed hyperlipidemia: Secondary | ICD-10-CM

## 2010-04-04 DIAGNOSIS — Z1159 Encounter for screening for other viral diseases: Secondary | ICD-10-CM | POA: Insufficient documentation

## 2010-04-04 DIAGNOSIS — Z01419 Encounter for gynecological examination (general) (routine) without abnormal findings: Secondary | ICD-10-CM

## 2010-04-04 DIAGNOSIS — G43909 Migraine, unspecified, not intractable, without status migrainosus: Secondary | ICD-10-CM | POA: Insufficient documentation

## 2010-04-04 LAB — CONVERTED CEMR LAB
Bilirubin Urine: NEGATIVE
Blood in Urine, dipstick: NEGATIVE
Glucose, Urine, Semiquant: NEGATIVE
Nitrite: NEGATIVE
Protein, U semiquant: NEGATIVE
Specific Gravity, Urine: 1.015
Urobilinogen, UA: 0.2
WBC Urine, dipstick: NEGATIVE
pH: 6

## 2010-04-04 LAB — HM PAP SMEAR

## 2010-04-06 NOTE — Progress Notes (Signed)
----   Converted from flag ---- ---- 03/28/2010 8:47 PM, Colon Flattery Tower MD wrote: please check wellness , lipid and vit /d level for v70.0 and vit D def  ---- 03/27/2010 10:22 AM, Liane Comber CMA (AAMA) wrote: Lab orders please! Good Morning! This pt is scheduled for cpx labs [place], which labs to draw and dx codes to use? Thanks Tasha ------------------------------

## 2010-04-14 ENCOUNTER — Encounter (INDEPENDENT_AMBULATORY_CARE_PROVIDER_SITE_OTHER): Payer: Self-pay | Admitting: *Deleted

## 2010-04-18 ENCOUNTER — Ambulatory Visit: Payer: Self-pay | Admitting: Family Medicine

## 2010-04-18 LAB — HM MAMMOGRAPHY: HM Mammogram: NORMAL

## 2010-04-18 NOTE — Assessment & Plan Note (Signed)
Summary: cpx/alc   Vital Signs:  Patient profile:   40 year old female Height:      66.5 inches Weight:      136.25 pounds BMI:     21.74 Temp:     98.5 degrees F oral Pulse rate:   60 / minute Pulse rhythm:   regular BP sitting:   114 / 70  (left arm) Cuff size:   regular  Vitals Entered By: Lewanda Rife LPN (April 04, 2954 2:26 PM) CC: CPX LMP partial hyst 2002/06/21 or Jun 20, 2004   History of Present Illness: here for wellness exam and to review chronic health problems  is doing ok overall   wt is down 19lb with bmi of 21 was going through a rough time -- and now appetite is back  back on more of a vegan diet - is also getting good protien  is exercising now- enjoys running -- is new to that    114/70-- bp continues to be good  hx of supracervical partial hyst for fibroids in the past PAP NL  1/10 due for that  no problems at all   mam neg 1/06-- wants to get that set up  self exam - no lumps    Td9/06   vit D def- level is 33- is taking biotin with ? D in it   needs her urine checked --hx of CT dz and needs it dipped for protien  she feels better off the plaquenil -- stayed off    chol very good Last Lipid ProfileCholesterol: 119 (03/30/2010 10:08:24 AM)HDL:  30.50 (03/30/2010 10:08:24 AM)LDL:  76 (03/30/2010 10:08:24 AM)Triglycerides:  Last Liver profileSGOT:  15 (03/30/2010 10:08:24 AM)SPGT:  13 (03/30/2010 10:08:24 AM)T. Bili:  1.1 (03/30/2010 10:08:24 AM)Alk Phos:  32 (03/30/2010 10:08:24 AM)   Allergies: 1)  Morphine  Past History:  Past Surgical History: Last updated: 03/07/08 Caesarean section BTL Shoulder surgery 20-Jun-1996) Knee arthroscopy Hysterectomy- fibroid uterine tumors (supracervical) Colonoscopy- int. and external hemorrhoids, diverticula CT of abd and pelvis- negative (Jun 20, 2005) CT of head- neg. 06-20-2004), MRI   Family History: Last updated: 07-Mar-2008 F skin ca- basal cell (died Jun 21, 2002) M DM HTN, low bone density  gm HTN GF colon ca  Social  History: Last updated: 04/04/2010 no alcohol non smoker  vegan diet - intermittent daughter grad hs - 2008-06-20 runs for exercise   Risk Factors: Smoking Status: quit (04/22/2006)  Past Medical History: Anxiety connective tissue disease- on plaquenil migraine   rheum---[facility]   Social History: no alcohol non smoker  vegan diet - intermittent daughter grad hs - 2010 runs for exercise   Review of Systems General:  Complains of fatigue; denies fever, loss of appetite, and malaise. Eyes:  Denies blurring and eye irritation. CV:  Denies chest pain or discomfort, palpitations, and shortness of breath with exertion. Resp:  Denies cough, shortness of breath, and wheezing. GI:  Denies abdominal pain, change in bowel habits, indigestion, and nausea. GU:  Denies abnormal vaginal bleeding, discharge, dysuria, and urinary frequency. MS:  Denies cramps and stiffness. Derm:  Denies itching, lesion(s), poor wound healing, and rash. Neuro:  Denies numbness and tingling. Psych:  went through a hard time -- mood is better now . Endo:  Denies cold intolerance, excessive thirst, excessive urination, and heat intolerance. Heme:  Denies abnormal bruising and bleeding.  Physical Exam  General:  slim and well appearing  Head:  normocephalic, atraumatic, and no abnormalities observed.   Eyes:  vision grossly intact, pupils equal,  pupils round, and pupils reactive to light.  no conjunctival pallor, injection or icterus  Ears:  R ear normal and L ear normal.   Nose:  no nasal discharge.   Mouth:  pharynx pink and moist.   Neck:  supple with full rom and no masses or thyromegally, no JVD or carotid bruit  Chest Wall:  No deformities, masses, or tenderness noted. Breasts:  No mass, nodules, thickening, tenderness, bulging, retraction, inflamation, nipple discharge or skin changes noted.   Lungs:  Normal respiratory effort, chest expands symmetrically. Lungs are clear to auscultation, no  crackles or wheezes. Heart:  Normal rate and regular rhythm. S1 and S2 normal without gallop, murmur, click, rub or other extra sounds. Abdomen:  Bowel sounds positive,abdomen soft and non-tender without masses, organomegaly or hernias noted. Genitalia:  normal introitus, no external lesions, no vaginal discharge, mucosa pink and moist, no vaginal or cervical lesions, no vaginal atrophy, and no friaility or hemorrhage.  uterus is cervically absent  Msk:  No deformity or scoliosis noted of thoracic or lumbar spine.  no acute joint changes less myofascial tenderness  Pulses:  2+ rad pulses Extremities:  brisk cap refill Neurologic:  sensation intact to light touch, gait normal, and DTRs symmetrical and normal.   Skin:  Intact without suspicious lesions or rashes Cervical Nodes:  No lymphadenopathy noted Axillary Nodes:  No palpable lymphadenopathy Inguinal Nodes:  No significant adenopathy Psych:  normal affect, talkative and pleasant    Impression & Recommendations:  Problem # 1:  HEALTH MAINTENANCE EXAM (ICD-V70.0) Assessment Comment Only  reviewed health habits including diet, exercise and skin cancer prevention reviewed health maintenance list and family history plans not to loose more wt  commended on good exercise and healthy diet  wellness labs were reviewed in detail  Orders: UA Dipstick w/o Micro (manual) ([address])  Problem # 2:  UNSPECIFIED VITAMIN D DEFICIENCY (ICD-268.9) Assessment: Unchanged this is stable recommend 1000 international units daily   Problem # 3:  HYPERLIPIDEMIA (ICD-272.2) Assessment: Improved lipids are good - working on HDL with exercise  rev labs and low sat fat diet with pt   Problem # 4:  CONNECTIVE TISSUE DISEASE (ICD-710.9) Assessment: Improved  ua today pt thinks she is in remission -- doing well without meds at this time (though plaquanil does help when symptomatic)  Orders: UA Dipstick w/o Micro (manual) ([address])  Problem # 5:   GYNECOLOGICAL EXAMINATION, ROUTINE (ICD-V72.31) annual exam for pt s/p supracervical hyst  Problem # 6:  OTHER SCREENING MAMMOGRAM (ICD-V76.12) annual mammogram scheduled adv pt to continue regular self breast exams non remarkable breast exam today  Orders: Radiology Referral (Radiology)  Complete Medication List: 1)  Biotin ?mg  .... Two tablets by mouth daily 2)  Imitrex 100 Mg Tabs (Sumatriptan succinate) .Marland Kitchen.. 1 by mouth times one as needed migraine  Other Orders: Prescription Created Electronically [phone])  Patient Instructions: 1)  please aim for 1000 international units daily of vitamin D 3  2)  work on regular meals -- let me know if you need help with anything  3)  good job on the exercise  Prescriptions: IMITREX 100 MG TABS (SUMATRIPTAN SUCCINATE) 1 by mouth times one as needed migraine  #9 x 11   Entered and Authorized by:   Judith Part MD   Signed by:   Judith Part MD on 04/04/2010   Method used:   [facility]        [phone][facility] (retail[facility]  [address]       [place], [place]  [address]       Ph: [phone]       Fax: [phone]   RxID:   [id number]    Orders Added: 1)  Radiology Referral [Radiology] 2)  Prescription Created Electronically [G8553] 3)  Est. Patient 40-64 years [99396] 4)  UA Dipstick w/o Micro (manual) [81002]    Current Allergies (reviewed today): MORPHINE   Laboratory Results   Urine Tests    Routine Urinalysis   Color: yellow Appearance: Clear Glucose: negative   (Normal Range: Negative) Bilirubin: negative   (Normal Range: Negative) Ketone: trace (5)   (Normal Range: Negative) Spec. Gravity: 1.015   (Normal Range: 1.003-1.035) Blood: negative   (Normal Range: Negative) pH: 6.0   (Normal Range: 5.0-8.0) Protein: negative   (Normal Range: Negative) Urobilinogen: 0.2   (Normal Range: 0-1) Nitrite: negative   (Normal Range: Negative) Leukocyte Esterace: negative   (Normal Range:  Negative)

## 2010-04-18 NOTE — Letter (Signed)
Summary: Results Follow up Letter  [place] [facility]  [address] [place], [place] [address]   Phone: [phone]  Fax: [phone]    04/14/2010 MRN: [id number]     Caroline Mullen [address] [place], [place]  [address]  [place]    Dear Ms. Simer,  The following are the results of your recent test(s):  Test         Result    Pap Smear:        Normal __x___  Not Normal _____ Comments:Repeat in 1 year ______________________________________________________ Cholesterol: LDL(Bad cholesterol):         Your goal is less than:         HDL (Good cholesterol):       Your goal is more than: Comments:  ______________________________________________________ Mammogram:        Normal _____  Not Normal _____ Comments:  ___________________________________________________________________ Hemoccult:        Normal _____  Not normal _______ Comments:    _____________________________________________________________________ Other Tests:    We routinely do not discuss normal results over the telephone.  If you desire a copy of the results, or you have any questions about this information we can discuss them at your next office visit.   Sincerely,  Roxy Manns MD

## 2010-05-12 ENCOUNTER — Telehealth: Payer: Self-pay

## 2010-05-12 NOTE — Telephone Encounter (Signed)
Was verifying health maintenance had been updated with mammogram results.

## 2010-05-26 ENCOUNTER — Encounter: Payer: Self-pay | Admitting: Family Medicine

## 2010-07-04 ENCOUNTER — Telehealth: Payer: Self-pay | Admitting: *Deleted

## 2010-07-04 ENCOUNTER — Encounter: Payer: Self-pay | Admitting: Family Medicine

## 2010-07-04 NOTE — Telephone Encounter (Signed)
Patient says that this is the second day for symptoms, fever of 101, on a scale of 1-10 her ear pain is a 3, but her throat would be a five.

## 2010-07-04 NOTE — Telephone Encounter (Signed)
How many days has she herself had symptoms? How high is fever? How severe is ear pain? Let me know -thanks

## 2010-07-04 NOTE — Telephone Encounter (Signed)
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 There are no appts today, so she wants to know if you could just call in some tamiflu for her, [facility].

## 2010-07-04 NOTE — Telephone Encounter (Signed)
Patient notified as instructed by telephone. Pt said the cases were confirmed by testing. Pt said she wants an appt to be seen on tomorrow. Dr Milinda Antis said OK to schedule. Scheduled appt with Dr Hetty Ely 07/05/10 at 9:15am. Pt said if she decides to go to urgent care tonight she will call in AM to cancel.

## 2010-07-04 NOTE — Telephone Encounter (Signed)
I went ahead and did a CDC search for influenza and it would be exceedingly rare to see it in [place] this month.... Were these kids definitively dx with influenza by testing ?  If not this is more likely another viral syndrome

## 2010-07-05 ENCOUNTER — Ambulatory Visit (INDEPENDENT_AMBULATORY_CARE_PROVIDER_SITE_OTHER): Payer: BC Managed Care – PPO | Admitting: Family Medicine

## 2010-07-05 ENCOUNTER — Encounter: Payer: Self-pay | Admitting: Family Medicine

## 2010-07-05 VITALS — BP 104/70 | HR 84 | Temp 98.3°F | Ht 66.5 in | Wt 137.5 lb

## 2010-07-05 DIAGNOSIS — J069 Acute upper respiratory infection, unspecified: Secondary | ICD-10-CM

## 2010-07-05 NOTE — Progress Notes (Signed)
  Subjective:    Patient ID: Caroline Mullen, female    DOB: [birth year], 40 y.o.   MRN: [id number]  HPI Pt of Dr Royden Purl here as acute pt for fever and ear pain in the left>right. Her throat also hurts. She has had this for 2 days. 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 The pain in the ear and the throat is the worst.She has had headache in the front. Her fever was the highest at 101 yesterday. She has had 100 this AM and took IBP and Advil Cold and Sinus. She has minimal runny nose and mild cough nonproductive. Mild nausea last night, no vomiting. Her husbanfd has pinkeye diagnosed yesterday. She has two new foster daughters. She is also achey.   Review of SystemsNoncontributory except as above.       Objective:   Physical Exam  Constitutional: She appears well-developed and well-nourished. No distress.  HENT:  Head: Normocephalic and atraumatic.  Right Ear: External ear normal.  Left Ear: External ear normal.  Nose: Nose normal.  Mouth/Throat: Oropharynx is clear and moist. No oropharyngeal exudate.  Eyes: Conjunctivae and EOM are normal. Pupils are equal, round, and reactive to light.       Minimal palpebral conjunctival erythema.  Neck: Normal range of motion. Neck supple. No thyromegaly present.  Cardiovascular: Normal rate, regular rhythm and normal heart sounds.   Pulmonary/Chest: Effort normal and breath sounds normal. She has no wheezes. She has no rales.  Lymphadenopathy:    She has no cervical adenopathy.  Skin: She is not diaphoretic.          Assessment & Plan:  URI  See instructions

## 2010-07-05 NOTE — Patient Instructions (Addendum)
Take Guaifenesin (400mg ), take 11/2 tabs by mouth AM and NOON. Get GUAIFENESIN by  [facility] and getting MUCOUS RELIEF EXPECTORANT/CONGESTION. DO NOT GET MUCINEX (Timed Release Guaifenesin)  Drink lots of fluids. Gargle with 30ccs of warm salt water every half hour for 2 days as able. Use IBP 3 tabs after each meal. Come back if sxs don't improve.

## 2010-07-07 ENCOUNTER — Telehealth: Payer: Self-pay | Admitting: *Deleted

## 2010-07-07 MED ORDER — AMOXICILLIN-POT CLAVULANATE 875-125 MG PO TABS: 1.0000 | ORAL_TABLET | Freq: Two times a day (BID) | ORAL | Status: AC

## 2010-07-07 NOTE — Telephone Encounter (Signed)
Patient in to see Dr. Hetty Ely on Wednesday for severe sore throat. No rapid strep test done. She was told it was probably the flu and was told to gargle with salt water and take mucinex.  She now has severe sore throat with exudates and + temp. (max 101) and feels horrible. Her husband and child both have been diagnosed with strep in the last 2 days. She was wondering if she can have an abx because she feels like she has the same thing and really feels too bad to come in, but she will if necessary. [facility] [place]

## 2010-07-07 NOTE — Telephone Encounter (Signed)
Sounds like we need to cover her for strep  Px written for call in  For augmentin  F/u if worse or not imp next week

## 2010-07-07 NOTE — Telephone Encounter (Signed)
Patient notified as instructed by telephone.[facility]ication [facility] as instructed.

## 2010-08-08 ENCOUNTER — Encounter: Payer: Self-pay | Admitting: Family Medicine

## 2010-08-08 ENCOUNTER — Ambulatory Visit (INDEPENDENT_AMBULATORY_CARE_PROVIDER_SITE_OTHER): Payer: BC Managed Care – PPO | Admitting: Family Medicine

## 2010-08-08 DIAGNOSIS — J019 Acute sinusitis, unspecified: Secondary | ICD-10-CM | POA: Insufficient documentation

## 2010-08-08 MED ORDER — AMOXICILLIN-POT CLAVULANATE 875-125 MG PO TABS: 1.0000 | ORAL_TABLET | Freq: Two times a day (BID) | ORAL | Status: AC

## 2010-08-08 NOTE — Patient Instructions (Signed)
Drink lots of fluids Start using nasal saline spray to irrigate your sinuses Get rest  advil sinus is ok if it does not upset your stomach (take it with food)  Use warm compress on face  If starting to improve in 1 week - please call
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
## 2010-08-08 NOTE — Assessment & Plan Note (Signed)
After a viral uri  tx with augmentin and sympt care- see inst  Update if not imp in 7 days or if worse

## 2010-11-17 ENCOUNTER — Ambulatory Visit (INDEPENDENT_AMBULATORY_CARE_PROVIDER_SITE_OTHER): Payer: BC Managed Care – PPO | Admitting: Family Medicine

## 2010-11-17 ENCOUNTER — Encounter: Payer: Self-pay | Admitting: Family Medicine

## 2010-11-17 VITALS — BP 100/70 | HR 64 | Temp 98.1°F | Ht 66.5 in | Wt 145.8 lb

## 2010-11-17 DIAGNOSIS — E559 Vitamin D deficiency, unspecified: Secondary | ICD-10-CM

## 2010-11-17 DIAGNOSIS — E538 Deficiency of other specified B group vitamins: Secondary | ICD-10-CM | POA: Insufficient documentation

## 2010-11-17 DIAGNOSIS — R350 Frequency of micturition: Secondary | ICD-10-CM

## 2010-11-17 LAB — POCT URINALYSIS DIPSTICK
Blood, UA: NEGATIVE
Glucose, UA: NEGATIVE
Ketones, UA: NEGATIVE
Leukocytes, UA: NEGATIVE
Nitrite, UA: NEGATIVE
Protein, UA: NEGATIVE
Spec Grav, UA: 1.01
Urobilinogen, UA: 0.2
pH, UA: 6

## 2010-11-17 MED ORDER — CYANOCOBALAMIN 1000 MCG/ML IJ SOLN
1000.0000 ug | Freq: Once | INTRAMUSCULAR | Status: AC
  Administered 2010-11-17: 1000 ug via INTRAMUSCULAR

## 2010-11-17 MED ORDER — CYANOCOBALAMIN 1000 MCG/ML IJ SOLN: INTRAMUSCULAR | Status: DC

## 2010-11-17 NOTE — Assessment & Plan Note (Signed)
New with some tremor and tingling and malaise  Will start with 4 shots in 4 weeks followed by 1 shot monthly Lab and f/u 3 mo

## 2010-11-17 NOTE — Assessment & Plan Note (Signed)
Vit D is low normal range  Adv to inc to 2000 iu D per day  Re check 3 mo before f/u

## 2010-11-17 NOTE — Patient Instructions (Addendum)
B12 shot today Then get one shot weekly for 3 weeks  Then one shot per month  Get B12 level checked in 3 months  Then f/u with me in 3 months after labs  For urine frequncy- drink water and avoid other beverages / avoid baths and soap in the urethral area  Update me if no improvement  Take vitamin D 2000 iu per day (D3) over the counter
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
## 2010-11-30 ENCOUNTER — Emergency Department: Payer: Self-pay | Admitting: *Deleted

## 2010-12-05 ENCOUNTER — Encounter: Payer: Self-pay | Admitting: Neurology

## 2010-12-05 ENCOUNTER — Encounter: Payer: Self-pay | Admitting: Family Medicine

## 2010-12-05 ENCOUNTER — Ambulatory Visit (INDEPENDENT_AMBULATORY_CARE_PROVIDER_SITE_OTHER): Payer: BC Managed Care – PPO | Admitting: Family Medicine

## 2010-12-05 VITALS — BP 124/70 | HR 84 | Temp 98.3°F | Ht 66.5 in | Wt 148.5 lb

## 2010-12-05 DIAGNOSIS — R251 Tremor, unspecified: Secondary | ICD-10-CM

## 2010-12-05 DIAGNOSIS — R200 Anesthesia of skin: Secondary | ICD-10-CM | POA: Insufficient documentation

## 2010-12-05 DIAGNOSIS — R209 Unspecified disturbances of skin sensation: Secondary | ICD-10-CM

## 2010-12-05 DIAGNOSIS — R55 Syncope and collapse: Secondary | ICD-10-CM | POA: Insufficient documentation

## 2010-12-05 DIAGNOSIS — R259 Unspecified abnormal involuntary movements: Secondary | ICD-10-CM

## 2010-12-05 NOTE — Patient Instructions (Signed)
We will do a referral to neurology for arm pain and numbness/ tremor / fainting spells  If symptoms worsen before we get you in- let me know Keep writing in a journal for stress  Stay active but don't do anything that makes you hurt
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
## 2010-12-07 NOTE — Assessment & Plan Note (Signed)
Syncope after nausea and need to have bm -- not first time Sounds vasovagal Has happened before  Has had neg neuro w/u in past No seizure activity

## 2010-12-07 NOTE — Assessment & Plan Note (Signed)
Ongoing in hands and arms/ worse on R and intermittent Neg w/u at [facility] in past - ? - pt will get those records  Disc poss of carpal tunnel vs other  She has worries about MS given recent other symptoms Ref to neuro

## 2010-12-07 NOTE — Assessment & Plan Note (Signed)
Worsening tremor in hands - that does not always correlate with anx This goes along with numb and weak feeling Pt worried about MS Ref to neurology Did adv to avoid caffiene

## 2010-12-28 ENCOUNTER — Encounter: Payer: Self-pay | Admitting: Neurology

## 2010-12-28 ENCOUNTER — Ambulatory Visit (INDEPENDENT_AMBULATORY_CARE_PROVIDER_SITE_OTHER): Payer: BC Managed Care – PPO | Admitting: Neurology

## 2010-12-28 DIAGNOSIS — R200 Anesthesia of skin: Secondary | ICD-10-CM

## 2010-12-28 DIAGNOSIS — R209 Unspecified disturbances of skin sensation: Secondary | ICD-10-CM

## 2010-12-28 DIAGNOSIS — R55 Syncope and collapse: Secondary | ICD-10-CM

## 2010-12-28 NOTE — Progress Notes (Signed)
Dear Dr. Milinda Antis,  Thank you for having me see Caroline Mullen in consultation today at [facility] for her problem with numbness and tingling her hands, dizziness, headaches, arm pain and spells of passing out.  As you may recall, she is a 40 y.o. year old female with a history of possible B12 deficiency and "undifferentiated connective tissue disease" originally diagnosed at [facility], who since September of this year has had worsening right elbow pain(that migrates to other joints), dizziness and light headedness.  She says the dizziness, tingling in her hands, fatigue is there all the time.  She feels intolerant of riding in a car, because of the dizziness.  She has had worsening headaches for 5 months, with dull headaches every day.  She has times where her veins bulge in her unilateral hands, accompanied by pain.  She has a history of what sounds like vasovagal syncopal spells and had one recently when she was nauseous on the toilet.  She felt clammy and lost consciousness and fell off the toilet.  She was confused afterwards.  No tongue biting.  She has had an very stressful year with marital problems as well as the loss of 3 of their foster children.  She has a history of a connective tissue disease causing arthralgias, hand swelling and fatigue.  She was seen at [facility], and due to "abnormal complement" numbers was placed on Plaquenil.  She feels these symptoms have improved overall.  Interestingly, she was recently diagnosed with B12 deficiency, but has felt worse since being placed on supplementation.  She worries this is due to B12 allowing her white blood cells to be produced and thus exacerbating her auto-immune disease.  In addition, she has had the numbness and tingling in the hands before and had a normal EMG/NCS at [facility].  Her primary worry is whether she has multiple sclerosis.  Past Medical History  Diagnosis Date  . Anxiety   . Migraine   . Connective tissue disease     on  Plaquenil    Past Surgical History  Procedure Date  . Cesarean section   . Tubal ligation   . Shoulder surgery 1998  . Knee arthroscopy   . Abdominal hysterectomy     fibroid uterine tumors supracervical    History   Social History  . Marital Status: Married    Spouse Name: N/A    Number of Children: N/A  . Years of Education: N/A   Social History Main Topics  . Smoking status: Former Smoker    Quit date: 12/28/2007  . Smokeless tobacco: Never Used   Comment: quit about 5 years ago  . Alcohol Use: No  . Drug Use: No  . Sexually Active: None   Other Topics Concern  . None   Social History Narrative  . None    Family History  Problem Relation Age of Onset  . Diabetes Mother   . Hypertension Mother   . Cancer Father     skin CA Basal cell died 06-11-02    Current Outpatient Prescriptions on File Prior to Visit  Medication Sig Dispense Refill  . cetirizine (ZYRTEC) 10 MG tablet Take 10 mg by mouth daily as needed.        . cyanocobalamin (,VITAMIN B-12,) 1000 MCG/ML injection Inject 1 Ml IM please once weekly for 3 weeks and then once monthly  8 mL  3  . ibuprofen (ADVIL,MOTRIN) 200 MG tablet Take 200 mg by mouth as needed.        Marland Kitchen  Pseudoephedrine-Ibuprofen 30-200 MG TABS Take by mouth as needed.        . SUMAtriptan (IMITREX) 100 MG tablet Take 100 mg by mouth once as needed.        Marland Kitchen BIOTIN PO Take 2 tablets by mouth daily.        Marland Kitchen etodolac (LODINE) 500 MG tablet Take 500 mg by mouth 2 (two) times daily.          Allergies  Allergen Reactions  . Morphine     REACTION: rash      ROS:  13 systems were reviewed and are notable for the multiple complaints listed in the HPI.  All other review of systems are unremarkable.   Examination:  Filed Vitals:   12/28/10 0825  BP: 114/74  Pulse: 74  Weight: 148 lb 14.4 oz (67.541 kg)     In general, very nervous appearing woman.  Cardiovascular: The patient has a regular rate and rhythm.  Fundoscopy:    Disks are flat. Vessel caliber within normal limits.  Mental status:   The patient is oriented to person, place and time. Recent and remote memory are intact. Attention span and concentration are normal. Language including repetition, naming, following commands are intact. Fund of knowledge of current and historical events, as well as vocabulary are normal.  Cranial Nerves: Pupils are equally round and reactive to light. Visual fields full to confrontation. Extraocular movements are intact without nystagmus. Facial sensation and muscles of mastication are intact. Muscles of facial expression are symmetric. Hearing intact to bilateral finger rub. Tongue protrusion, uvula, palate midline.  Shoulder shrug intact  Motor:  The patient has normal bulk and tone, no pronator drift.  There are no adventitious movements.  5/5 bilaterally.  Reflexes:   Biceps  Triceps Brachioradialis Knee Ankle  Right 2+  2+  2+   2+ 2+  Left  2+  2+  2+   2+ 2+  Toes down  Coordination:  Normal finger to nose.  No dysdiadokinesia.  Sensation is intact to vibration but there is length dependent sensory loss to temperature.  Gait and Station are normal.  Tandem gait is intact.  Romberg is negative  CT scan was reviewed and was unremarkable.  Impression/Recs: 19y old with multiple complaints of dizziness, tingling in the hands, a spell of passing out, and migrating pains.  I suspect that her spell of passing out was a vasovagal event, just as you had surmised.  I told her that I did not think she had multiple sclerosis.  I am going to get an MRI of her brain with and without contrast as well as C spine to look for any signs of demyelination.  I am also going to get an EMG/NCS of her right and lower upper extremity to look for signs of a radiculopathy or compressive neuropathy as well as distal peripheral neuropathy.  However, I suspect that most of her issues are anxiety related and that she has a degree of  somatoform disorder.  I have mentioned this to her, and that given her stressful year, that she should consider some type of psychological help.  She is willing to consider this.  We are going to get the records from [facility] about her "connective tissue disease" as well as her B12 numbers from [place].     Thank you for having [place] see Caroline Mullen in consultation.  Feel free to contact me with any questions.  Lupita Raider Modesto Charon, MD [facility], [place] [phone]  [place], [place] [address] Phone: [phone] Fax: [phone].

## 2010-12-28 NOTE — Patient Instructions (Addendum)
Your MRI is schedule at [facility] on Wed., Dec. 5th at 1200 noon. Please arrive at 11:45 to check in.  Your NCV/ENG is scheduled for Dec. 10th at [facility] located at [address] in [place]. Please arrive at 8:45. [phone]  Your next appt with Dr. Modesto Charon is Jan. 10th at 10:30.

## 2011-01-03 ENCOUNTER — Ambulatory Visit (HOSPITAL_COMMUNITY)
Admission: RE | Admit: 2011-01-03 | Discharge: 2011-01-03 | Payer: BC Managed Care – PPO | Source: Ambulatory Visit | Attending: Neurology | Admitting: Neurology

## 2011-01-03 ENCOUNTER — Ambulatory Visit (HOSPITAL_COMMUNITY)
Admission: RE | Admit: 2011-01-03 | Discharge: 2011-01-03 | Disposition: A | Discharge status: Home / Self Care | Payer: BC Managed Care – PPO | Source: Ambulatory Visit | Attending: Neurology | Admitting: Neurology

## 2011-01-03 DIAGNOSIS — M5020 Other cervical disc displacement, unspecified cervical region: Secondary | ICD-10-CM | POA: Insufficient documentation

## 2011-01-03 DIAGNOSIS — M542 Cervicalgia: Secondary | ICD-10-CM | POA: Insufficient documentation

## 2011-01-03 DIAGNOSIS — R51 Headache: Secondary | ICD-10-CM | POA: Insufficient documentation

## 2011-01-03 DIAGNOSIS — R209 Unspecified disturbances of skin sensation: Secondary | ICD-10-CM | POA: Insufficient documentation

## 2011-01-03 DIAGNOSIS — R200 Anesthesia of skin: Secondary | ICD-10-CM

## 2011-01-03 DIAGNOSIS — R55 Syncope and collapse: Secondary | ICD-10-CM

## 2011-01-03 MED ORDER — GADOBENATE DIMEGLUMINE 529 MG/ML IV SOLN
13.0000 mL | Freq: Once | INTRAVENOUS | Status: AC
  Administered 2011-01-03: 13 mL via INTRAVENOUS

## 2011-01-04 ENCOUNTER — Ambulatory Visit: Payer: BC Managed Care – PPO | Admitting: Neurology

## 2011-01-25 ENCOUNTER — Telehealth: Payer: Self-pay | Admitting: Neurology

## 2011-01-25 NOTE — Telephone Encounter (Signed)
Message copied by Benay Spice on Thu Jan 25, 2011  1:09 PM ------      Message from: Milas Gain      Created: Thu Jan 25, 2011 12:36 PM       Jan - Please let Ms. Washington know that I got her CT of her head and just had a chance to review it with the neuroradiologists.  Our consensus is that the changes in the bone I discussed with her are completely normal.            Thank her for the notes she sent.  That will help going forward.

## 2011-01-25 NOTE — Telephone Encounter (Signed)
Called and spoke with the patient. Information given as directed by Dr. Modesto Charon below. The patient voiced no additional concerns.

## 2011-02-08 ENCOUNTER — Encounter: Payer: Self-pay | Admitting: Neurology

## 2011-02-08 ENCOUNTER — Ambulatory Visit (INDEPENDENT_AMBULATORY_CARE_PROVIDER_SITE_OTHER): Payer: BC Managed Care – PPO | Admitting: Neurology

## 2011-02-08 VITALS — BP 120/72 | HR 60 | Wt 150.0 lb

## 2011-02-08 DIAGNOSIS — M5410 Radiculopathy, site unspecified: Secondary | ICD-10-CM

## 2011-02-08 DIAGNOSIS — IMO0002 Reserved for concepts with insufficient information to code with codable children: Secondary | ICD-10-CM

## 2011-02-08 NOTE — Patient Instructions (Addendum)
Your MRI is scheduled at [facility] located at [address] (beside the hospital) on Saturday, January 12th at 3:00 pm.  Please arrive 15 minutes prior to your appointment.  [phone].   You have a follow up appointment with Dr. Modesto Charon on March 11th at 10:30 am.

## 2011-02-08 NOTE — Progress Notes (Signed)
Dear Dr. Milinda Antis,  I saw  Caroline Mullen back in [facility] for her problem with numbness and tingling in the hands, migrating body pains, dizziness and headaches.  As you may recall, she is a 41 y.o. year old female with a history of an "undifferentiated connective tissue disease" originally diagnosed at [facility] as well as B12 deficiency.  Due to her history of numbness and tingling in her hands I got a NCS of her right upper and lower extremity and an EMG of her lower extremity.  This excluded a large fiber peripheral neuropathy.  There was no electrophysiologic signs of a median neuropathy at the wrist, but Dr. Anne Hahn who did the study, felt she still may have bilateral CTS without NCS evidence.  Notably they did find a right chronic S1 radiculopathy.  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  I reviewed an old MRI with radiology and a recent CT of the head and they felt the clivus was unchanged from previous MRI 3 years ago and there was no bony destruction on CT.  I reviewed the notes from [facility] which mentioned a ANA 1:640, a mildly positive ab to dsDNA and a low C4.  The patient has been off her Plaquenil for several years because it has not helped.  She has not been back to see [facility] rheumatology.   The arm and hand numbness is not worse during sleep.  Seems to be associated with wrist and elbow joint pain at times.  Medical history, social history, and family history were reviewed and have not changed since the last clinic visit.   Current Outpatient Prescriptions on File Prior to Visit  Medication Sig Dispense Refill  . BIOTIN PO Take 2 tablets by mouth daily.        . cetirizine (ZYRTEC) 10 MG tablet Take 10 mg by mouth daily as needed.        . cyanocobalamin (,VITAMIN B-12,) 1000 MCG/ML injection Inject 1 Ml IM please once weekly for 3 weeks and then once monthly  8 mL  3  . ibuprofen (ADVIL,MOTRIN) 200 MG tablet  Take 200 mg by mouth as needed.        . SUMAtriptan (IMITREX) 100 MG tablet Take 100 mg by mouth once as needed.          Allergies  Allergen Reactions  . Morphine     REACTION: rash    ROS:  13 systems were reviewed and are notable for significant anxiety  and mood disturbance. after her foster children were placed in another home.  All other review of systems are unremarkable.  Exam: . Filed Vitals:   02/08/11 1031  BP: 120/72  Pulse: 60  Weight: 150 lb (68.04 kg)    In general, well appearing women.   Cranial Nerves: Pupils are equally round and reactive to light. Visual fields full to confrontation. Extraocular movements are intact without nystagmus. Facial sensation and muscles of mastication are intact. Muscles of facial expression are symmetric. Hearing intact to bilateral finger rub. Tongue protrusion, uvula, palate midline.  Shoulder shrug intact  Motor:  Normal bulk and tone, no drift and 5/5 muscle strength bilaterally.  Reflexes:  2+ thoughout.  Coordination:  Normal finger to nose  Gait:  Normal gait and station.   No tinel's sign bilaterally.  Impression/Recs:  Multiple complaints of dizziness, headaches, migrating pains and bilateral arm numbness and tingling as well as headaches.  I don't have any definite evidence of nervous system dysfunction at this time.  I think it may be worthwhile for her to see a rheumatologist again, given her serology for consideration of starting another disease modifying agent -- however, I still am not convinced her problems are rheumatologic.  Certainly I think anxiety is playing a role as well.  I am going to get an MRI of her L-spine given her S1 radic.  However, this certainly does not explain most of her symptoms.   We will see the patient back in 2 months.  Lupita Raider Modesto Charon, MD [facility], [place]

## 2011-02-10 ENCOUNTER — Ambulatory Visit (HOSPITAL_COMMUNITY)
Admission: RE | Admit: 2011-02-10 | Discharge: 2011-02-10 | Disposition: A | Discharge status: Home / Self Care | Payer: BC Managed Care – PPO | Source: Ambulatory Visit | Attending: Neurology | Admitting: Neurology

## 2011-02-10 DIAGNOSIS — M51379 Other intervertebral disc degeneration, lumbosacral region without mention of lumbar back pain or lower extremity pain: Secondary | ICD-10-CM | POA: Insufficient documentation

## 2011-02-10 DIAGNOSIS — M545 Low back pain, unspecified: Secondary | ICD-10-CM | POA: Insufficient documentation

## 2011-02-10 DIAGNOSIS — M5137 Other intervertebral disc degeneration, lumbosacral region: Secondary | ICD-10-CM | POA: Insufficient documentation

## 2011-02-10 DIAGNOSIS — M5410 Radiculopathy, site unspecified: Secondary | ICD-10-CM

## 2011-02-13 ENCOUNTER — Encounter: Payer: Self-pay | Admitting: Neurology

## 2011-02-19 ENCOUNTER — Ambulatory Visit (INDEPENDENT_AMBULATORY_CARE_PROVIDER_SITE_OTHER): Payer: BC Managed Care – PPO | Admitting: Family Medicine

## 2011-02-19 ENCOUNTER — Encounter: Payer: Self-pay | Admitting: Family Medicine

## 2011-02-19 VITALS — BP 114/74 | HR 68 | Temp 97.9°F | Ht 66.5 in | Wt 151.2 lb

## 2011-02-19 DIAGNOSIS — H9209 Otalgia, unspecified ear: Secondary | ICD-10-CM

## 2011-02-19 DIAGNOSIS — R200 Anesthesia of skin: Secondary | ICD-10-CM

## 2011-02-19 DIAGNOSIS — E041 Nontoxic single thyroid nodule: Secondary | ICD-10-CM

## 2011-02-19 DIAGNOSIS — R209 Unspecified disturbances of skin sensation: Secondary | ICD-10-CM

## 2011-02-19 DIAGNOSIS — N6459 Other signs and symptoms in breast: Secondary | ICD-10-CM

## 2011-02-19 DIAGNOSIS — N6019 Diffuse cystic mastopathy of unspecified breast: Secondary | ICD-10-CM | POA: Insufficient documentation

## 2011-02-19 DIAGNOSIS — H9201 Otalgia, right ear: Secondary | ICD-10-CM | POA: Insufficient documentation

## 2011-02-19 HISTORY — DX: Nontoxic single thyroid nodule: E04.1

## 2011-02-19 NOTE — Assessment & Plan Note (Signed)
This was fleeting- better now and nl exam Will update if symptoms return ? If had brief effusion

## 2011-02-19 NOTE — Patient Instructions (Signed)
We will set up thyroid ultrasound at check out  Keep writing in a journal  If the area in breast enlarges or becomes painful- call  If ear pain continues or worsens call  Continue follow up with Dr Modesto Charon

## 2011-02-19 NOTE — Assessment & Plan Note (Signed)
Examined R breast and area in question feels like normal dense tissue Will continue to monitor Update if not starting to improve in a week or if worsening  / enlarging

## 2011-02-19 NOTE — Assessment & Plan Note (Signed)
Is following up with Dr Waymon Amato carpal tunnel Doing better overall with both this and tremor
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
## 2011-02-19 NOTE — Assessment & Plan Note (Signed)
This was seen incidentally on her MRI neck  Will send for [place]  Pending official labs from [facility] too Nl exam except for tremor

## 2011-02-20 ENCOUNTER — Ambulatory Visit: Payer: Self-pay | Admitting: Family Medicine

## 2011-02-21 ENCOUNTER — Encounter: Payer: Self-pay | Admitting: Family Medicine

## 2011-02-23 ENCOUNTER — Telehealth: Payer: Self-pay | Admitting: Family Medicine

## 2011-02-23 DIAGNOSIS — E041 Nontoxic single thyroid nodule: Secondary | ICD-10-CM

## 2011-02-23 NOTE — Telephone Encounter (Signed)
Requests call back with Thyroid US results

## 2011-02-23 NOTE — Telephone Encounter (Signed)
I spoke with pt and advised her of results, she wants referral to a Dr at [facility].

## 2011-02-23 NOTE — Telephone Encounter (Signed)
Requests call back to discuss test results

## 2011-02-25 NOTE — Telephone Encounter (Signed)
Will go ahead with ref to endo at [facility]

## 2011-04-09 ENCOUNTER — Ambulatory Visit: Payer: BC Managed Care – PPO | Admitting: Neurology

## 2011-05-15 ENCOUNTER — Ambulatory Visit: Payer: BC Managed Care – PPO | Admitting: Neurology

## 2011-05-24 ENCOUNTER — Ambulatory Visit: Payer: Self-pay | Admitting: Family Medicine

## 2011-05-25 ENCOUNTER — Encounter: Payer: Self-pay | Admitting: Family Medicine

## 2011-05-29 ENCOUNTER — Encounter: Payer: Self-pay | Admitting: *Deleted

## 2011-12-10 ENCOUNTER — Encounter: Payer: BC Managed Care – PPO | Admitting: Family Medicine

## 2012-03-16 ENCOUNTER — Other Ambulatory Visit: Payer: Self-pay

## 2012-04-08 ENCOUNTER — Ambulatory Visit: Payer: Self-pay | Admitting: Obstetrics and Gynecology

## 2013-06-04 ENCOUNTER — Other Ambulatory Visit: Payer: Self-pay | Admitting: Obstetrics and Gynecology

## 2013-06-04 DIAGNOSIS — Z1231 Encounter for screening mammogram for malignant neoplasm of breast: Secondary | ICD-10-CM

## 2013-06-12 ENCOUNTER — Ambulatory Visit
Admission: RE | Admit: 2013-06-12 | Discharge: 2013-06-12 | Disposition: A | Discharge status: Home / Self Care | Payer: BC Managed Care – PPO | Source: Ambulatory Visit | Attending: Obstetrics and Gynecology | Admitting: Obstetrics and Gynecology

## 2013-06-12 DIAGNOSIS — Z1231 Encounter for screening mammogram for malignant neoplasm of breast: Secondary | ICD-10-CM

## 2013-06-16 ENCOUNTER — Other Ambulatory Visit: Payer: Self-pay | Admitting: Obstetrics and Gynecology

## 2013-06-16 DIAGNOSIS — N63 Unspecified lump in unspecified breast: Secondary | ICD-10-CM

## 2013-06-29 ENCOUNTER — Ambulatory Visit
Admission: RE | Admit: 2013-06-29 | Discharge: 2013-06-29 | Disposition: A | Discharge status: Home / Self Care | Payer: BC Managed Care – PPO | Source: Ambulatory Visit | Attending: Obstetrics and Gynecology | Admitting: Obstetrics and Gynecology

## 2013-06-29 DIAGNOSIS — N63 Unspecified lump in unspecified breast: Secondary | ICD-10-CM

## 2014-01-02 DIAGNOSIS — R071 Chest pain on breathing: Secondary | ICD-10-CM | POA: Insufficient documentation

## 2014-08-19 ENCOUNTER — Ambulatory Visit
Admission: RE | Admit: 2014-08-19 | Discharge: 2014-08-19 | Disposition: A | Discharge status: Home / Self Care | Payer: BLUE CROSS/BLUE SHIELD | Source: Ambulatory Visit | Attending: Medical | Admitting: Medical

## 2014-08-19 ENCOUNTER — Other Ambulatory Visit: Payer: Self-pay | Admitting: Medical

## 2014-08-19 DIAGNOSIS — R05 Cough: Secondary | ICD-10-CM

## 2014-08-19 DIAGNOSIS — R059 Cough, unspecified: Secondary | ICD-10-CM

## 2014-09-22 ENCOUNTER — Other Ambulatory Visit: Payer: Self-pay | Admitting: Obstetrics and Gynecology

## 2014-09-22 ENCOUNTER — Ambulatory Visit (INDEPENDENT_AMBULATORY_CARE_PROVIDER_SITE_OTHER): Payer: BLUE CROSS/BLUE SHIELD | Admitting: Obstetrics and Gynecology

## 2014-09-22 ENCOUNTER — Encounter: Payer: Self-pay | Admitting: Obstetrics and Gynecology

## 2014-09-22 VITALS — BP 118/73 | HR 66 | Ht 68.0 in | Wt 158.9 lb

## 2014-09-22 DIAGNOSIS — E559 Vitamin D deficiency, unspecified: Secondary | ICD-10-CM

## 2014-09-22 DIAGNOSIS — Z01419 Encounter for gynecological examination (general) (routine) without abnormal findings: Secondary | ICD-10-CM

## 2014-09-22 NOTE — Patient Instructions (Signed)
Thank you for enrolling in MyChart. Please follow the instructions below to securely access your online medical record. MyChart allows you to send messages to your doctor, view your test results, renew your prescriptions, schedule appointments, and more.  How Do I Sign Up? 1. In your Internet browser, go to [email or web address]. 2. Click on the New  User? link in the Sign In box.  3. Enter your MyChart Access Code exactly as it appears below. You will not need to use this code after you have completed the sign-up process. If you do not sign up before the expiration date, you must request a new code. MyChart Access Code: Activation code not generated Current MyChart Status: Active  4. Enter the last four digits of your Social Security Number (xxxx) and Date of Birth (mm/dd/yyyy) as indicated and click Next. You will be taken to the next sign-up page. 5. Create a MyChart ID. This will be your MyChart login ID and cannot be changed, so think of one that is secure and easy to remember. 6. Create a MyChart password. You can change your password at any time. 7. Enter your Password Reset Question and Answer and click Next. This can be used at a later time if you forget your password.  8. Select your communication preference, and if applicable enter your e-mail address. You will receive e-mail notification when new information is available in MyChart by choosing to receive e-mail notifications and filling in your e-mail. 9. Click Sign In. You can now view your medical record.   Additional Information If you have questions, you can email [email or web address] or call [phone] to talk to our MyChart staff. Remember, MyChart is NOT to be used for urgent needs. For medical emergencies, dial 911.  

## 2014-09-22 NOTE — Progress Notes (Signed)
Patient ID: Caroline Mullen, female   DOB: Dec 02, 1970, 44 y.o.   MRN: [id number] Subjective:   Caroline Mullen is a 44 y.o. No obstetric history on file. Caucasian female here for a routine well-woman exam.  No LMP recorded. Patient has had a hysterectomy.    Current complaints: none  PCP: NA       does desire labs- to be drawn at work  Social History: Sexual: heterosexual Marital Status: married Living situation: with spouse Occupation: works in Medical laboratory scientific officer at [employer], and independently as Environmental manager Tobacco/alcohol: no tobacco use Illicit drugs: no history of illicit drug use  The following portions of the patient's history were reviewed and updated as appropriate: allergies, current medications, past family history, past medical history, past social history, past surgical history and problem list.  Past Medical History Past Medical History  Diagnosis Date  . Anxiety   . Migraine   . Connective tissue disease     on Plaquenil    Past Surgical History Past Surgical History  Procedure Laterality Date  . Cesarean section    . Tubal ligation    . Shoulder surgery  1998  . Knee arthroscopy    . Abdominal hysterectomy      fibroid uterine tumors supracervical    Gynecologic History No obstetric history on file.  No LMP recorded. Patient has had a hysterectomy. Contraception: status post hysterectomy Last Pap: 2014. Results were: normal Last mammogram: 2015. Results were: increased density  Obstetric History OB History  No data available    Current Medications Current Outpatient Prescriptions on File Prior to Visit  Medication Sig Dispense Refill  . cetirizine (ZYRTEC) 10 MG tablet Take 10 mg by mouth daily as needed.      Marland Kitchen ibuprofen (ADVIL,MOTRIN) 200 MG tablet Take 200 mg by mouth as needed.      . SUMAtriptan (IMITREX) 100 MG tablet Take 100 mg by mouth once as needed.      Marland Kitchen BIOTIN PO Take 2 tablets by mouth daily.      . Cholecalciferol (VITAMIN D) 2000  UNITS CAPS Take 1 capsule by mouth daily.    . cyanocobalamin (,VITAMIN B-12,) 1000 MCG/ML injection Inject 1 Ml IM please once weekly for 3 weeks and then once monthly (Patient not taking: Reported on 09/22/2014) 8 mL 3   No current facility-administered medications on file prior to visit.    Review of Systems Patient denies any headaches, blurred vision, shortness of breath, chest pain, abdominal pain, problems with bowel movements, urination, or intercourse.  Objective:  BP 118/73 mmHg  Pulse 66  Ht 5\' 8"  (1.727 m)  Wt 158 lb 14.4 oz (72.077 kg)  BMI 24.17 kg/m2 Physical Exam  General:  Well developed, well nourished, no acute distress. She is alert and oriented x3. Skin:  Warm and dry Neck:  Midline trachea, no thyromegaly or nodules Cardiovascular: Regular rate and rhythm, no murmur heard Lungs:  Effort normal, all lung fields clear to auscultation bilaterally Breasts:  No dominant palpable mass, retraction, or nipple discharge Abdomen:  Soft, non tender, no hepatosplenomegaly or masses Pelvic:  External genitalia is normal in appearance.  The vagina is normal in appearance. The cervix is bulbous, no CMT.  Thin prep pap is done with HR HPV cotesting. Uterus is felt to be normal size, shape, and contour.  No adnexal masses or tenderness noted. Extremities:  No swelling or varicosities noted Psych:  She has a normal mood and affect  Assessment:   Healthy  well-woman exam, Migraines, seasonal allergies  Plan:  Pap obtained, and routine screening labs ordered F/U 1 year for AE, or sooner if needed Mammogram now or sooner if problems Colonoscopy NA or sooner if problems  Mada Sadik Elissa Lovett, CNM

## 2014-09-23 LAB — CYTOLOGY - PAP

## 2014-09-28 ENCOUNTER — Encounter: Payer: Self-pay | Admitting: *Deleted

## 2014-10-01 ENCOUNTER — Ambulatory Visit
Admission: RE | Admit: 2014-10-01 | Discharge: 2014-10-01 | Disposition: A | Discharge status: Home / Self Care | Payer: BLUE CROSS/BLUE SHIELD | Source: Ambulatory Visit | Attending: Obstetrics and Gynecology | Admitting: Obstetrics and Gynecology

## 2014-10-01 ENCOUNTER — Other Ambulatory Visit: Payer: Self-pay | Admitting: Obstetrics and Gynecology

## 2014-10-01 DIAGNOSIS — Z1231 Encounter for screening mammogram for malignant neoplasm of breast: Secondary | ICD-10-CM | POA: Diagnosis present

## 2014-10-01 DIAGNOSIS — Z01419 Encounter for gynecological examination (general) (routine) without abnormal findings: Secondary | ICD-10-CM | POA: Diagnosis present

## 2014-10-07 ENCOUNTER — Encounter: Payer: Self-pay | Admitting: *Deleted

## 2014-10-08 ENCOUNTER — Encounter: Payer: Self-pay | Admitting: Speech Pathology

## 2015-04-05 ENCOUNTER — Telehealth: Payer: Self-pay

## 2015-04-05 NOTE — Telephone Encounter (Signed)
Pt request date of last tetanus; advised per immunization record last td was 09/29/2004. Pt voiced understanding.

## 2015-04-27 ENCOUNTER — Emergency Department: Payer: BLUE CROSS/BLUE SHIELD

## 2015-04-27 ENCOUNTER — Emergency Department
Admission: EM | Admit: 2015-04-27 | Discharge: 2015-04-27 | Disposition: A | Discharge status: Home / Self Care | Payer: BLUE CROSS/BLUE SHIELD | Attending: Emergency Medicine | Admitting: Emergency Medicine

## 2015-04-27 ENCOUNTER — Encounter: Payer: Self-pay | Admitting: Emergency Medicine

## 2015-04-27 DIAGNOSIS — R079 Chest pain, unspecified: Secondary | ICD-10-CM | POA: Diagnosis present

## 2015-04-27 DIAGNOSIS — G43909 Migraine, unspecified, not intractable, without status migrainosus: Secondary | ICD-10-CM | POA: Diagnosis not present

## 2015-04-27 DIAGNOSIS — Z87891 Personal history of nicotine dependence: Secondary | ICD-10-CM | POA: Insufficient documentation

## 2015-04-27 DIAGNOSIS — E0590 Thyrotoxicosis, unspecified without thyrotoxic crisis or storm: Secondary | ICD-10-CM | POA: Insufficient documentation

## 2015-04-27 DIAGNOSIS — E785 Hyperlipidemia, unspecified: Secondary | ICD-10-CM | POA: Insufficient documentation

## 2015-04-27 LAB — COMPREHENSIVE METABOLIC PANEL
ALT: 35 U/L (ref 14–54)
AST: 29 U/L (ref 15–41)
Albumin: 3.4 g/dL — ABNORMAL LOW (ref 3.5–5.0)
Alkaline Phosphatase: 43 U/L (ref 38–126)
Anion gap: 8 (ref 5–15)
BUN: 15 mg/dL (ref 6–20)
CO2: 23 mmol/L (ref 22–32)
Calcium: 9.2 mg/dL (ref 8.9–10.3)
Chloride: 105 mmol/L (ref 101–111)
Creatinine, Ser: 0.44 mg/dL (ref 0.44–1.00)
GFR calc Af Amer: >60 mL/min (ref >60)
GFR calc non Af Amer: >60 mL/min (ref >60)
Glucose, Bld: 108 mg/dL — ABNORMAL HIGH (ref 65–99)
Potassium: 3.8 mmol/L (ref 3.5–5.1)
Sodium: 136 mmol/L (ref 135–145)
Total Bilirubin: 1 mg/dL (ref 0.3–1.2)
Total Protein: 6.3 g/dL — ABNORMAL LOW (ref 6.5–8.1)

## 2015-04-27 LAB — CBC
HCT: 38.7 % (ref 35.0–47.0)
Hemoglobin: 13.5 g/dL (ref 12.0–16.0)
MCH: 29.7 pg (ref 26.0–34.0)
MCHC: 34.8 g/dL (ref 32.0–36.0)
MCV: 85.4 fL (ref 80.0–100.0)
Platelets: 232 10*3/uL (ref 150–440)
RBC: 4.53 MIL/uL (ref 3.80–5.20)
RDW: 12.1 % (ref 11.5–14.5)
WBC: 7.9 10*3/uL (ref 3.6–11.0)

## 2015-04-27 LAB — TROPONIN I: Troponin I: <0.03 ng/mL (ref <0.031)

## 2015-04-27 LAB — T4, FREE: Free T4: 4.97 ng/dL — ABNORMAL HIGH (ref 0.61–1.12)

## 2015-04-27 LAB — TSH: TSH: 0.016 u[IU]/mL — ABNORMAL LOW (ref 0.350–4.500)

## 2015-04-27 LAB — FIBRIN DERIVATIVES D-DIMER (ARMC ONLY): Fibrin derivatives D-dimer (ARMC): 866 — ABNORMAL HIGH (ref 0–499)

## 2015-04-27 MED ORDER — PROPRANOLOL HCL 1 MG/ML IV SOLN: 1.0000 mg | Freq: Once | INTRAVENOUS | Status: DC

## 2015-04-27 MED ORDER — DIAZEPAM 5 MG PO TABS
10.0000 mg | ORAL_TABLET | Freq: Once | ORAL | Status: AC
  Administered 2015-04-27: 10 mg via ORAL
  Filled 2015-04-27: qty 2

## 2015-04-27 MED ORDER — PROPRANOLOL HCL 40 MG PO TABS: 40.0000 mg | ORAL_TABLET | Freq: Two times a day (BID) | ORAL | Status: DC

## 2015-04-27 MED ORDER — IOPAMIDOL (ISOVUE-370) INJECTION 76%
75.0000 mL | Freq: Once | INTRAVENOUS | Status: AC | PRN
  Administered 2015-04-27: 75 mL via INTRAVENOUS

## 2015-04-27 MED ORDER — METHIMAZOLE 10 MG PO TABS: 10.0000 mg | ORAL_TABLET | Freq: Every day | ORAL | Status: DC

## 2015-04-27 MED ORDER — METHIMAZOLE 10 MG PO TABS
10.0000 mg | ORAL_TABLET | Freq: Once | ORAL | Status: AC
  Administered 2015-04-27: 10 mg via ORAL
  Filled 2015-04-27: qty 1

## 2015-04-27 MED ORDER — PROPRANOLOL HCL 40 MG PO TABS
40.0000 mg | ORAL_TABLET | Freq: Once | ORAL | Status: AC
  Administered 2015-04-27: 40 mg via ORAL
  Filled 2015-04-27: qty 1

## 2015-04-27 NOTE — ED Notes (Signed)
Notified pharmacy of meds

## 2015-04-27 NOTE — ED Notes (Signed)
Pt presents to ED with left sided chest pain radiating to back. Pt states has rapid heartbeat. Pt in NAD in triage.

## 2015-04-27 NOTE — Discharge Instructions (Signed)
Hyperthyroidism Hyperthyroidism is when the thyroid is too active (overactive). Your thyroid is a large gland that is located in your neck. The thyroid helps to control how your body uses food (metabolism). When your thyroid is overactive, it produces too much of a hormone called thyroxine.  CAUSES Causes of hyperthyroidism may include:  Graves disease. This is when your immune system attacks the thyroid gland. This is the most common cause.  Inflammation of the thyroid gland.  Tumor in the thyroid gland or somewhere else.  Excessive use of thyroid medicines, including:  Prescription thyroid supplement.  Herbal supplements that mimic thyroid hormones.  Solid or fluid-filled lumps within your thyroid gland (thyroid nodules).  Excessive ingestion of iodine. RISK FACTORS  Being female.  Having a family history of thyroid conditions. SIGNS AND SYMPTOMS Signs and symptoms of hyperthyroidism may include:  Nervousness.  Inability to tolerate heat.  Unexplained weight loss.  Diarrhea.  Change in the texture of hair or skin.  Heart skipping beats or making extra beats.  Rapid heart rate.  Loss of menstruation.  Shaky hands.  Fatigue.  Restlessness.  Increased appetite.  Sleep problems.  Enlarged thyroid gland or nodules. DIAGNOSIS  Diagnosis of hyperthyroidism may include:  Medical history and physical exam.  Blood tests.  Ultrasound tests. TREATMENT Treatment may include:  Medicines to control your thyroid.  Surgery to remove your thyroid.  Radiation therapy. HOME CARE INSTRUCTIONS   Take medicines only as directed by your health care provider.  Do not use any tobacco products, including cigarettes, chewing tobacco, or electronic cigarettes. If you need help quitting, ask your health care provider.  Do not exercise or do physical activity until your health care provider approves.  Keep all follow-up appointments as directed by your health care  provider. This is important. SEEK MEDICAL CARE IF:  Your symptoms do not get better with treatment.  You have fever.  You are taking thyroid replacement medicine and you:  Have depression.  Feel mentally and physically slow.  Have weight gain. SEEK IMMEDIATE MEDICAL CARE IF:   You have decreased alertness or a change in your awareness.  You have abdominal pain.  You feel dizzy.  You have a rapid heartbeat.  You have an irregular heartbeat.   This information is not intended to replace advice given to you by your health care provider. Make sure you discuss any questions you have with your health care provider.   Document Released: 01/15/2005 Document Revised: 02/05/2014 Document Reviewed: 06/02/2013 Elsevier Interactive Patient Education 2016 Elsevier Inc.  
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
## 2015-05-02 ENCOUNTER — Other Ambulatory Visit: Payer: Self-pay | Admitting: Obstetrics and Gynecology

## 2015-08-01 ENCOUNTER — Encounter: Payer: Self-pay | Admitting: *Deleted

## 2015-08-01 ENCOUNTER — Other Ambulatory Visit: Payer: BLUE CROSS/BLUE SHIELD

## 2015-08-01 NOTE — Patient Instructions (Signed)
  Your procedure is scheduled on: @ADMITDT2 @ Report to Day Surgery. [facility] SECOND FLOOR To find out your arrival time please call [phone] between 1PM - 3PM on 08/05/15 Remember: Instructions that are not followed completely may result in serious medical risk, up to and including death, or upon the discretion of your surgeon and anesthesiologist your surgery may need to be rescheduled.    _X___ 1. Do not eat food or drink liquids after midnight. No gum chewing or hard candies.     __X__ 2. No Alcohol for 24 hours before or after surgery.   _X___ 3. Do Not Smoke For 24 Hours Prior to Your Surgery.   ____ 4. Bring all medications with you on the day of surgery if instructed.    _X___ 5. Notify your doctor if there is any change in your medical condition     (cold, fever, infections).       Do not wear jewelry, make-up, hairpins, clips or nail polish.  Do not wear lotions, powders, or perfumes. You may wear deodorant.  Do not shave 48 hours prior to surgery. Men may shave face and neck.  Do not bring valuables to the hospital.    [facility] is not [facility] for any belongings or valuables.               Contacts, dentures or bridgework may not be worn into surgery.  Leave your suitcase in the car. After surgery it may be brought to your room.  For patients admitted to the hospital, discharge time is determined by your                treatment team.   Patients discharged the day of surgery will not be allowed to drive home.   Please read over the following fact sheets that you were given:   Surgical Site Infection Prevention   ____ Take these medicines the morning of surgery with A SIP OF WATER:    1. METHIMAZOLE  2. PROPRANOLOL  3.   4.  5.  6.  ____ Fleet Enema (as directed)   ____ Use CHG Soap as directed  ____ Use inhalers on the day of surgery  ____ Stop metformin 2 days prior to surgery    ____ Take 1/2 of usual insulin dose the night before surgery and  none on the morning of surgery.   ____ Stop Coumadin/Plavix/aspirin on   _X___ Stop Anti-inflammatories on     STOP ADVIL UNTIL AFTER SURGERY   ____ Stop supplements until after surgery.    ____ Bring C-Pap to the hospital.

## 2015-08-01 NOTE — Pre-Procedure Instructions (Signed)
05/31/2015 06/30/2008 12/31/2007 06/25/2007 12/25/2006 06/26/2006 03/13/2006 09/10/2005 03/12/2005    14.2 14.2 14.6 13.8 14.6 14.2 14.9 14.8 14.1  41.8 0.41 0.42 0.41 0.43 0.41 0.43 0.41 0.42   4.63 4.67 4.46 4.74 4.49 4.71 4.71 4.56   30.7 31.3 30.9 30.8 31.6 31.6 31.4 30.9   34.7 35.1 33.9 33.7 34.6 34.5 36.3 (H) 33.5   12.4 12.2 12 11.9 12.1 11.8 12.2 12.2   88 89 91 91 91 92 87 92   0.0 0.0 0.0 0.0 0.0 0.0 0.0 0.0   233 228 215 197 220 200 195 229   6.9 6.7 6.2 5.5 7.5 7.0 5.3 6.7  6.9          4.82          86.7          29.5          34.0          12.1          10.2 (H)           Hemoglobin  Hematocrit  Red Blood Cell Count  MCH  MCHC  RDW-CV  MCV  Nucleated RBC %  Platelet Count /L  White Blood Cell Count  WBC (White Blood Cell Count)  RBC (Red Blood Cell Count)  MCV (Mean Corpuscular Volume)  MCH (Mean Corpuscular Hemoglobin)  MCHC (Mean Corpuscular Hemoglobin Concentration)  RDW-CV (Red Cell Distribution Width)  MPV (Mean Platelet Volume)

## 2015-08-05 ENCOUNTER — Ambulatory Visit: Payer: BLUE CROSS/BLUE SHIELD | Admitting: Endocrinology

## 2015-08-08 ENCOUNTER — Encounter: Payer: Self-pay | Admitting: *Deleted

## 2015-08-08 ENCOUNTER — Encounter
Admission: RE | Disposition: A | Discharge status: Home / Self Care | Payer: Self-pay | Source: Ambulatory Visit | Attending: Otolaryngology

## 2015-08-08 ENCOUNTER — Ambulatory Visit: Payer: BLUE CROSS/BLUE SHIELD | Admitting: Anesthesiology

## 2015-08-08 ENCOUNTER — Observation Stay
Admission: RE | Admit: 2015-08-08 | Discharge: 2015-08-09 | Disposition: A | Discharge status: Home / Self Care | Payer: BLUE CROSS/BLUE SHIELD | Source: Ambulatory Visit | Attending: Otolaryngology | Admitting: Otolaryngology

## 2015-08-08 DIAGNOSIS — Z90710 Acquired absence of both cervix and uterus: Secondary | ICD-10-CM | POA: Insufficient documentation

## 2015-08-08 DIAGNOSIS — G43909 Migraine, unspecified, not intractable, without status migrainosus: Secondary | ICD-10-CM | POA: Diagnosis not present

## 2015-08-08 DIAGNOSIS — Z833 Family history of diabetes mellitus: Secondary | ICD-10-CM | POA: Insufficient documentation

## 2015-08-08 DIAGNOSIS — Z791 Long term (current) use of non-steroidal anti-inflammatories (NSAID): Secondary | ICD-10-CM | POA: Diagnosis not present

## 2015-08-08 DIAGNOSIS — E785 Hyperlipidemia, unspecified: Secondary | ICD-10-CM | POA: Insufficient documentation

## 2015-08-08 DIAGNOSIS — F419 Anxiety disorder, unspecified: Secondary | ICD-10-CM | POA: Diagnosis not present

## 2015-08-08 DIAGNOSIS — Z79899 Other long term (current) drug therapy: Secondary | ICD-10-CM | POA: Diagnosis not present

## 2015-08-08 DIAGNOSIS — E890 Postprocedural hypothyroidism: Secondary | ICD-10-CM

## 2015-08-08 DIAGNOSIS — E063 Autoimmune thyroiditis: Secondary | ICD-10-CM | POA: Insufficient documentation

## 2015-08-08 DIAGNOSIS — Z8249 Family history of ischemic heart disease and other diseases of the circulatory system: Secondary | ICD-10-CM | POA: Diagnosis not present

## 2015-08-08 DIAGNOSIS — K589 Irritable bowel syndrome without diarrhea: Secondary | ICD-10-CM | POA: Insufficient documentation

## 2015-08-08 DIAGNOSIS — Z87891 Personal history of nicotine dependence: Secondary | ICD-10-CM | POA: Diagnosis not present

## 2015-08-08 DIAGNOSIS — Z79891 Long term (current) use of opiate analgesic: Secondary | ICD-10-CM | POA: Diagnosis not present

## 2015-08-08 DIAGNOSIS — E079 Disorder of thyroid, unspecified: Secondary | ICD-10-CM | POA: Diagnosis not present

## 2015-08-08 DIAGNOSIS — E0520 Thyrotoxicosis with toxic multinodular goiter without thyrotoxic crisis or storm: Principal | ICD-10-CM | POA: Insufficient documentation

## 2015-08-08 HISTORY — PX: THYROIDECTOMY: SHX17

## 2015-08-08 HISTORY — DX: Thyrotoxicosis, unspecified without thyrotoxic crisis or storm: E05.90

## 2015-08-08 SURGERY — THYROIDECTOMY
Anesthesia: General

## 2015-08-08 MED ORDER — ONDANSETRON HCL 4 MG/2ML IJ SOLN: 4.0000 mg | Freq: Four times a day (QID) | INTRAMUSCULAR | Status: DC | PRN

## 2015-08-08 MED ORDER — FENTANYL CITRATE (PF) 100 MCG/2ML IJ SOLN
INTRAMUSCULAR | Status: AC
  Administered 2015-08-08: 25 ug via INTRAVENOUS
  Filled 2015-08-08: qty 2

## 2015-08-08 MED ORDER — TRAMADOL HCL 50 MG PO TABS
100.0000 mg | ORAL_TABLET | Freq: Four times a day (QID) | ORAL | Status: DC | PRN
  Administered 2015-08-08 – 2015-08-09 (×2): 100 mg via ORAL
  Filled 2015-08-08 (×2): qty 2

## 2015-08-08 MED ORDER — FENTANYL CITRATE (PF) 100 MCG/2ML IJ SOLN
25.0000 ug | INTRAMUSCULAR | Status: DC | PRN
  Administered 2015-08-08 (×4): 25 ug via INTRAVENOUS

## 2015-08-08 MED ORDER — FAMOTIDINE 20 MG PO TABS
20.0000 mg | ORAL_TABLET | Freq: Once | ORAL | Status: AC
  Administered 2015-08-08: 20 mg via ORAL

## 2015-08-08 MED ORDER — ONDANSETRON HCL 4 MG/2ML IJ SOLN
INTRAMUSCULAR | Status: AC
  Administered 2015-08-08: 4 mg via INTRAVENOUS
  Filled 2015-08-08: qty 2

## 2015-08-08 MED ORDER — PROMETHAZINE HCL 25 MG/ML IJ SOLN
INTRAMUSCULAR | Status: AC
  Administered 2015-08-08: 6.25 mg via INTRAVENOUS
  Filled 2015-08-08: qty 1

## 2015-08-08 MED ORDER — PROMETHAZINE HCL 25 MG/ML IJ SOLN: 6.2500 mg | Freq: Once | INTRAMUSCULAR | Status: DC

## 2015-08-08 MED ORDER — FLEET ENEMA 7-19 GM/118ML RE ENEM: 1.0000 | ENEMA | Freq: Once | RECTAL | Status: DC | PRN

## 2015-08-08 MED ORDER — SODIUM CHLORIDE 0.9 % IJ SOLN
INTRAMUSCULAR | Status: AC
  Filled 2015-08-08: qty 10

## 2015-08-08 MED ORDER — ONDANSETRON 8 MG PO TBDP: 4.0000 mg | ORAL_TABLET | Freq: Four times a day (QID) | ORAL | Status: DC | PRN

## 2015-08-08 MED ORDER — BISACODYL 5 MG PO TBEC: 5.0000 mg | DELAYED_RELEASE_TABLET | Freq: Every day | ORAL | Status: DC | PRN

## 2015-08-08 MED ORDER — ACETAMINOPHEN 650 MG RE SUPP: 650.0000 mg | Freq: Four times a day (QID) | RECTAL | Status: DC | PRN

## 2015-08-08 MED ORDER — PROMETHAZINE HCL 25 MG/ML IJ SOLN
6.2500 mg | Freq: Once | INTRAMUSCULAR | Status: AC
  Administered 2015-08-08: 6.25 mg via INTRAVENOUS

## 2015-08-08 MED ORDER — ACETAMINOPHEN 325 MG PO TABS: 650.0000 mg | ORAL_TABLET | Freq: Four times a day (QID) | ORAL | Status: DC | PRN

## 2015-08-08 MED ORDER — DEXTROSE-NACL 5-0.45 % IV SOLN
INTRAVENOUS | Status: DC
  Administered 2015-08-08 (×2): via INTRAVENOUS

## 2015-08-08 MED ORDER — LIDOCAINE HCL (CARDIAC) 20 MG/ML IV SOLN
INTRAVENOUS | Status: DC | PRN
  Administered 2015-08-08: 100 mg via INTRAVENOUS

## 2015-08-08 MED ORDER — DEXAMETHASONE SODIUM PHOSPHATE 10 MG/ML IJ SOLN
INTRAMUSCULAR | Status: AC
  Administered 2015-08-08: 5 mg via INTRAVENOUS
  Filled 2015-08-08: qty 1

## 2015-08-08 MED ORDER — ONDANSETRON HCL 4 MG/2ML IJ SOLN
INTRAMUSCULAR | Status: DC | PRN
Start: 2015-08-08 — End: 2015-08-08
  Administered 2015-08-08: 4 mg via INTRAVENOUS

## 2015-08-08 MED ORDER — PROMETHAZINE HCL 25 MG/ML IJ SOLN
6.2500 mg | Freq: Four times a day (QID) | INTRAMUSCULAR | Status: DC | PRN
  Administered 2015-08-08: 6.25 mg via INTRAVENOUS

## 2015-08-08 MED ORDER — SUCCINYLCHOLINE CHLORIDE 20 MG/ML IJ SOLN
INTRAMUSCULAR | Status: DC | PRN
  Administered 2015-08-08: 100 mg via INTRAVENOUS

## 2015-08-08 MED ORDER — LIDOCAINE-EPINEPHRINE (PF) 1 %-1:200000 IJ SOLN
INTRAMUSCULAR | Status: DC | PRN
  Administered 2015-08-08: 4 mL

## 2015-08-08 MED ORDER — HYDROMORPHONE HCL 1 MG/ML IJ SOLN
0.5000 mg | INTRAMUSCULAR | Status: DC | PRN
  Administered 2015-08-08: 0.5 mg via INTRAVENOUS
  Administered 2015-08-08 – 2015-08-09 (×2): 1 mg via INTRAVENOUS
  Filled 2015-08-08 (×3): qty 1

## 2015-08-08 MED ORDER — LIDOCAINE-EPINEPHRINE (PF) 1 %-1:200000 IJ SOLN
INTRAMUSCULAR | Status: AC
  Filled 2015-08-08: qty 30

## 2015-08-08 MED ORDER — FENTANYL CITRATE (PF) 100 MCG/2ML IJ SOLN
INTRAMUSCULAR | Status: DC | PRN
  Administered 2015-08-08: 75 ug via INTRAVENOUS

## 2015-08-08 MED ORDER — PROPOFOL 10 MG/ML IV BOLUS
INTRAVENOUS | Status: DC | PRN
  Administered 2015-08-08: 180 mg via INTRAVENOUS

## 2015-08-08 MED ORDER — ONDANSETRON HCL 4 MG/2ML IJ SOLN
4.0000 mg | Freq: Once | INTRAMUSCULAR | Status: AC | PRN
  Administered 2015-08-08: 4 mg via INTRAVENOUS

## 2015-08-08 MED ORDER — DEXAMETHASONE SODIUM PHOSPHATE 10 MG/ML IJ SOLN
INTRAMUSCULAR | Status: DC | PRN
  Administered 2015-08-08: 5 mg via INTRAVENOUS

## 2015-08-08 MED ORDER — BACITRACIN ZINC 500 UNIT/GM EX OINT
TOPICAL_OINTMENT | CUTANEOUS | Status: AC
  Filled 2015-08-08: qty 28.35

## 2015-08-08 MED ORDER — LACTATED RINGERS IV SOLN
INTRAVENOUS | Status: DC
  Administered 2015-08-08 (×2): via INTRAVENOUS

## 2015-08-08 MED ORDER — MAGNESIUM HYDROXIDE 400 MG/5ML PO SUSP: 30.0000 mL | Freq: Every day | ORAL | Status: DC | PRN

## 2015-08-08 MED ORDER — MIDAZOLAM HCL 2 MG/2ML IJ SOLN
INTRAMUSCULAR | Status: DC | PRN
  Administered 2015-08-08: 2 mg via INTRAVENOUS

## 2015-08-08 MED ORDER — BACITRACIN 500 UNIT/GM EX OINT
TOPICAL_OINTMENT | CUTANEOUS | Status: DC | PRN
  Administered 2015-08-08: 1 via TOPICAL

## 2015-08-08 MED ORDER — DEXAMETHASONE SODIUM PHOSPHATE 10 MG/ML IJ SOLN
5.0000 mg | Freq: Once | INTRAMUSCULAR | Status: AC
Start: 2015-08-08 — End: 2015-08-08
  Administered 2015-08-08: 5 mg via INTRAVENOUS

## 2015-08-08 SURGICAL SUPPLY — 37 items
BLADE SURG 15 STRL LF DISP TIS (BLADE) ×2 IMPLANT
BLADE SURG 15 STRL SS (BLADE) ×2
CANISTER SUCT 1200ML W/VALVE (MISCELLANEOUS) ×2 IMPLANT
CORD BIP STRL DISP 12FT (MISCELLANEOUS) ×2 IMPLANT
DRAIN TLS ROUND 10FR (DRAIN) ×4 IMPLANT
DRAPE MAG INST 16X20 L/F (DRAPES) ×2 IMPLANT
DRSG TEGADERM 2-3/8X2-3/4 SM (GAUZE/BANDAGES/DRESSINGS) ×2 IMPLANT
DRSG TEGADERM 4X4.75 (GAUZE/BANDAGES/DRESSINGS) ×2 IMPLANT
DRSG TELFA 3X8 NADH (GAUZE/BANDAGES/DRESSINGS) ×2 IMPLANT
ELECT LARYNGEAL 6/7 (MISCELLANEOUS) ×2
ELECT LARYNGEAL 8/9 (MISCELLANEOUS) ×2
ELECT REM PT RETURN 9FT ADLT (ELECTROSURGICAL) ×2
ELECTRODE LARYNGEAL 6/7 (MISCELLANEOUS) ×1 IMPLANT
ELECTRODE LARYNGEAL 8/9 (MISCELLANEOUS) ×1 IMPLANT
ELECTRODE REM PT RTRN 9FT ADLT (ELECTROSURGICAL) ×1 IMPLANT
FORCEPS JEWEL BIP 4-3/4 STR (INSTRUMENTS) IMPLANT
GLOVE BIO SURGEON STRL SZ7.5 (GLOVE) ×10 IMPLANT
GLOVE PROTEXIS LATEX SZ 7.5 (GLOVE) IMPLANT
GOWN STRL REUS W/ TWL LRG LVL3 (GOWN DISPOSABLE) ×3 IMPLANT
GOWN STRL REUS W/TWL LRG LVL3 (GOWN DISPOSABLE) ×3
HARMONIC SCALPEL FOCUS (MISCELLANEOUS) ×2 IMPLANT
HEMOSTAT SURGICEL 2X3 (HEMOSTASIS) IMPLANT
HOOK STAY BLUNT/RETRACTOR 5M (MISCELLANEOUS) ×2 IMPLANT
LABEL OR SOLS (LABEL) IMPLANT
NS IRRIG 500ML POUR BTL (IV SOLUTION) ×2 IMPLANT
PACK HEAD/NECK (MISCELLANEOUS) ×2 IMPLANT
PROBE NEUROSIGN BIPOL (MISCELLANEOUS) ×1 IMPLANT
PROBE NEUROSIGN BIPOLAR (MISCELLANEOUS) ×1
SPONGE KITTNER 5P (MISCELLANEOUS) ×4 IMPLANT
SPONGE XRAY 4X4 16PLY STRL (MISCELLANEOUS) ×4 IMPLANT
STRAP SAFETY BODY (MISCELLANEOUS) ×2 IMPLANT
SUT ETHILON 6 0 9-3 1X18 BLK (SUTURE) ×2 IMPLANT
SUT PROLENE 6 0 P 1 18 (SUTURE) IMPLANT
SUT SILK 2 0 (SUTURE)
SUT SILK 2-0 18XBRD TIE 12 (SUTURE) IMPLANT
SUT VIC AB 4-0 RB1 18 (SUTURE) ×2 IMPLANT
SYSTEM CHEST DRAIN TLS 7FR (DRAIN) IMPLANT

## 2015-08-08 NOTE — Anesthesia Procedure Notes (Signed)
Procedure Name: Intubation Date/Time: 08/08/2015 7:34 AM Performed by: Jannet MantisPACE, Alayshia Marini Pre-anesthesia Checklist: Patient identified, Emergency Drugs available, Suction available and Patient being monitored Patient Re-evaluated:Patient Re-evaluated prior to inductionOxygen Delivery Method: Circle system utilized Preoxygenation: Pre-oxygenation with 100% oxygen Intubation Type: IV induction Ventilation: Mask ventilation without difficulty Laryngoscope Size: Mac and 3 Grade View: Grade I Tube type: Oral Tube size: 6.5 mm Number of attempts: 1 Placement Confirmation: ETT inserted through vocal cords under direct vision,  positive ETCO2 and breath sounds checked- equal and bilateral Secured at: 20 cm Tube secured with: Tape Comments: Neurosign V4- laryngeal nerve electrode applied to tube- surgeon present to verify placement

## 2015-08-08 NOTE — Transfer of Care (Signed)
Immediate Anesthesia Transfer of Care Note  Patient: Caroline LaineMelissa T Mullen  Procedure(s) Performed: Procedure(s): THYROIDECTOMY (N/A)  Patient Location: PACU  Anesthesia Type:General  Level of Consciousness: awake  Airway & Oxygen Therapy: Patient Spontanous Breathing  Post-op Assessment: Report given to RN  Post vital signs: stable  Last Vitals:  Filed Vitals:   08/08/15 1018 08/08/15 1028  BP: 118/63   Pulse: 70   Temp: 36.4 C 36.4 C  Resp: 13     Last Pain: There were no vitals filed for this visit.       Complications: No apparent anesthesia complications

## 2015-08-08 NOTE — H&P (Signed)
  H&P has been reviewed and no changes necessary. To be downloaded later. 

## 2015-08-08 NOTE — Progress Notes (Signed)
08/08/2015 6:30pm  S- Post op check. Throat pain improved. Took some liquids ok. No nausea.  O- AVSS.  Wound flat, no swelling, dressing dry. Drains working ok. Voice clear. Breathing easily. No cramps.  A- doing well post op.  P- Will check calcium in am. Change dressing tomorrow and pull drains. Plan to D/C home in am if doing well.    Caroline MurdersPaul Trey Mullen

## 2015-08-08 NOTE — Op Note (Signed)
08/08/2015  10:38 AM    Connye BurkittHolmes, Dorianne  [id number]   Pre-Op Dx:  Hyperthyroidism with enlarged thyroid glands  Post-op Dx: Same  Proc: Total thyroidectomy   Surg:  Vernie MurdersJUENGEL,Celica Kotowski H      Assist: Vaught, Creighton  Anes:  GOT  EBL:  100 mL  Comp:  None  Findings:  Very enlarged thyroid glands bilaterally with dilated vessels. Small nodules off of the left inferior thyroid gland and the right superior and lateral thyroid gland. The facial nerve was found on both sides and stimulated well. Fairly large superior parathyroid glands were found bilaterally and possibly a small left inferior parathyroid gland.  Procedure: The patient was brought to the operating table placed in a supine position. General anesthesia was given by oral endotracheal intubation. The endotracheal tube was wrapped with electrodes for continuous monitoring of the recurrent laryngeal nerves. This was placed under direct vision to make sure the electrodes were in the correct position. A shoulder roll was placed. The neck was then marked and 4 mL of 1% Xylocaine with epi 1:100,000 was used for infiltration of the skin. She was then prepped and draped in a sterile fashion.  Incision was created through the skin and subcutaneous tissue to the platysmal layer. This was then divided. There were 2 large anterior jugular veins that were crossclamped cut and then tied with 2-0 Vicryl sutures. The strap muscles were divided in the midline and very large thyroid glands were evident on both sides with large veins as well. The left side was addressed first with elevation of the strap muscles over the anterior lateral borders the thyroid gland. The inferior lobe was freed as it was easier to visualize as the incision line was fairly low in the neck. Once this was mobilized some the superior pole of the thyroid gland was then freed up. There were very large veins and arteries attached superiorly. The superior parathyroid gland was evident  at the superior pole of the thyroid and this was freed up with the vessels in left in its bed. The thyroid gland was then rotated medially and the recurrent laryngeal nerve was found. The nerve was traced up and freed from the thyroid gland. The remaining attachments of the thyroid gland and the trachea including Berry's ligament were cut. This freed up the entire left thyroid gland with its only attachment to the isthmus across the anterior trachea. There was a small parathyroid gland attached inferiorly to the thyroid gland and the side. The nerve was restimulated to make sure that there was still good movement of the laryngeal muscles.  The right side was then addressed by elevating the strap muscles again overlying the thyroid gland. The lateral attachments were freed up along with some of the inferior attachments. The superior pole had a nodule that was attached right to the larynx first was freed this up and came across it but then went back and freed up the attachments the larynx to send that small piece separately. The superior vessels were then dissected out and cut with the Harmonic scalpel to free up the superior pole. There is a large nodule that was overlying the normal position of the recurrent laryngeal nerve. The nerve was found and traced up to the larynx and the nodule was freed up over the top of it. Berry's ligament was then freed up and separated this from the nerve. Remaining tracheal attachments were freed up to remove the thyroid gland completely. The nerve was stimulated again to make  sure there was still intact and had good mobility of the laryngeal muscles. A superior parathyroid was evident in the superior attachments that were removed. This also was fairly large as similar to the opposite superior parathyroid. I did not see an inferior parathyroid on this side.  Both wounds were then irrigated and visualized for any of bleeding sites. Small little areas of bleeding were controlled  with bipolar cautery. Meds were nice and clean on both sides. A piece of Surgicel was placed over the nerve in the tracheal groove on both sides. A 10 French TLS drain was placed through separate stab incision into each side crisscrossing in the midline. These were attached to Vacutainer suction. The wound was then closed strap muscles loosely held together with 4-0 Vicryl. The platysma layer was then closed with separate 4-0 Vicryl sutures. The dermis was then closed with 4-0 Vicryl sutures as well. Skin edges were held malposition with a running locking suture of 6-0 nylon. The wound was covered with bacitracin followed by Telfa and a Tegaderm. The drains were taped to the skin and the Vacutainer's taped to the right shoulder.  The patient was awakened taken to the recovery room in satisfactory condition. There were no operative complications. She tolerated the the procedure well. Total estimated blood loss 100 mL     Dispo:   To PACU to be sent to the floor for observation overnight  Plan:  To be discharged home tomorrow once the drains removed and a note that she is doing well.  Loxley Cibrian H  08/08/2015 10:38 AM

## 2015-08-08 NOTE — Anesthesia Preprocedure Evaluation (Signed)
Anesthesia Evaluation  Patient identified by MRN, date of birth, ID band Patient awake    Reviewed: Allergy & Precautions, H&P , NPO status , Patient's Chart, lab work & pertinent test results, reviewed documented beta blocker date and time   Airway Mallampati: II   Neck ROM: full    Dental  (+) Poor Dentition   Pulmonary neg pulmonary ROS, former smoker,    Pulmonary exam normal        Cardiovascular Exercise Tolerance: Good negative cardio ROS Normal cardiovascular exam Rhythm:regular Rate:Normal     Neuro/Psych  Headaches,  Neuromuscular disease negative neurological ROS  negative psych ROS   GI/Hepatic negative GI ROS, Neg liver ROS,   Endo/Other  negative endocrine ROSHyperthyroidism   Renal/GU negative Renal ROS  negative genitourinary   Musculoskeletal   Abdominal   Peds  Hematology negative hematology ROS (+)   Anesthesia Other Findings Past Medical History:   Anxiety                                                      Migraine                                                     Connective tissue disease (HCC)                                Comment:on Plaquenil    NO LONGER ON MEDS NO PROBLEMS X              6 YEARS   Hyperthyroidism                                            Past Surgical History:   CESAREAN SECTION                                              TUBAL LIGATION                                                SHOULDER SURGERY                                 1998         KNEE ARTHROSCOPY                                              ABDOMINAL HYSTERECTOMY  Comment:fibroid uterine tumors supracervical BMI    Body Mass Index   23.87 kg/m 2     Reproductive/Obstetrics negative OB ROS                             Anesthesia Physical Anesthesia Plan  ASA: II  Anesthesia Plan: General   Post-op Pain Management:     Induction: Intravenous  Airway Management Planned:   Additional Equipment:   Intra-op Plan:   Post-operative Plan:   Informed Consent: I have reviewed the patients History and Physical, chart, labs and discussed the procedure including the risks, benefits and alternatives for the proposed anesthesia with the patient or authorized representative who has indicated his/her understanding and acceptance.   Dental Advisory Given  Plan Discussed with: CRNA  Anesthesia Plan Comments:         Anesthesia Quick Evaluation

## 2015-08-09 DIAGNOSIS — E0520 Thyrotoxicosis with toxic multinodular goiter without thyrotoxic crisis or storm: Secondary | ICD-10-CM | POA: Diagnosis not present

## 2015-08-09 LAB — CALCIUM: Calcium: 7.9 mg/dL — ABNORMAL LOW (ref 8.9–10.3)

## 2015-08-09 LAB — SURGICAL PATHOLOGY

## 2015-08-09 NOTE — Progress Notes (Signed)
Pt A and O x 4. VSS. Pt tolerating diet well. No complaints of pain or nausea. IV removed intact, prescriptions given. Pt voiced understanding of discharge instructions with no further questions. Dressing supplies given. Pt discharged via wheelchair with axillary.

## 2015-08-09 NOTE — Discharge Summary (Signed)
08/09/2015 7:40 am  D/C note.   S- feeling better this am, had some pain last night. No nausea O-AVSS   Low heart rate and BP.  Wound flat. Dressing changed and drains removed. Redressed.     Await calcium level. Respirations and voice good. A- doing well postop total thyroidectomy.   P- Home. Follow up in office in 1 week for suture removal (Mon)      Will write for Hydrocodone for pain, since she doesn't feel the tramadol is working well. Has tolerated this       in the past without issues. Will use calcium supplements for now. Return to office sooner if problems.

## 2015-08-15 NOTE — Anesthesia Postprocedure Evaluation (Signed)
Anesthesia Post Note  [name]  Procedure(s) Performed: Procedure(s) (LRB): THYROIDECTOMY (N/A)  Patient location during evaluation: PACU Anesthesia Type: General Level of consciousness: awake and alert Pain management: pain level controlled Vital Signs Assessment: post-procedure vital signs reviewed and stable Respiratory status: spontaneous breathing, nonlabored ventilation, respiratory function stable and patient connected to nasal cannula oxygen Cardiovascular status: blood pressure returned to baseline and stable Postop Assessment: no signs of nausea or vomiting Anesthetic complications: no    Last Vitals:  Filed Vitals:   08/09/15 0117 08/09/15 0553  BP: 100/48 114/51  Pulse: 51 54  Temp: 36.6 C 36.4 C  Resp: 16 16    Last Pain:  Filed Vitals:   08/09/15 0553  PainSc: 3                  Yevette EdwardsJames G Adams

## 2015-09-05 ENCOUNTER — Other Ambulatory Visit: Payer: Self-pay | Admitting: Obstetrics and Gynecology

## 2015-09-05 DIAGNOSIS — Z1231 Encounter for screening mammogram for malignant neoplasm of breast: Secondary | ICD-10-CM

## 2015-09-07 ENCOUNTER — Telehealth: Payer: Self-pay | Admitting: Obstetrics and Gynecology

## 2015-09-07 DIAGNOSIS — Z01419 Encounter for gynecological examination (general) (routine) without abnormal findings: Secondary | ICD-10-CM

## 2015-09-07 NOTE — Telephone Encounter (Signed)
Patient called requesting to speak with you regarding labs. You can reach her at [phone].Thanks

## 2015-09-07 NOTE — Telephone Encounter (Signed)
This pt is having annual exam in sept, she wants you to order what labs you would need for her annual- b/c she is seeing her thyroid dr prior and he is getting those labs, wants me to mail lab orders to her

## 2015-09-07 NOTE — Telephone Encounter (Signed)
Would need comp. Metabolic, vit D, NMR,

## 2015-09-08 NOTE — Telephone Encounter (Signed)
Mailed all labs orders to pt

## 2015-10-04 ENCOUNTER — Ambulatory Visit: Payer: BLUE CROSS/BLUE SHIELD

## 2015-10-10 ENCOUNTER — Other Ambulatory Visit: Payer: Self-pay | Admitting: Obstetrics and Gynecology

## 2015-10-10 ENCOUNTER — Ambulatory Visit
Admission: RE | Admit: 2015-10-10 | Discharge: 2015-10-10 | Disposition: A | Discharge status: Home / Self Care | Payer: BLUE CROSS/BLUE SHIELD | Source: Ambulatory Visit | Attending: Obstetrics and Gynecology | Admitting: Obstetrics and Gynecology

## 2015-10-10 DIAGNOSIS — Z1231 Encounter for screening mammogram for malignant neoplasm of breast: Secondary | ICD-10-CM | POA: Insufficient documentation

## 2015-10-13 ENCOUNTER — Encounter: Payer: BLUE CROSS/BLUE SHIELD | Admitting: Obstetrics and Gynecology

## 2015-10-13 ENCOUNTER — Encounter: Payer: Self-pay | Admitting: Obstetrics and Gynecology

## 2015-10-13 ENCOUNTER — Ambulatory Visit (INDEPENDENT_AMBULATORY_CARE_PROVIDER_SITE_OTHER): Payer: BLUE CROSS/BLUE SHIELD | Admitting: Obstetrics and Gynecology

## 2015-10-13 VITALS — BP 133/76 | HR 87 | Ht 67.0 in | Wt 162.9 lb

## 2015-10-13 DIAGNOSIS — Z01419 Encounter for gynecological examination (general) (routine) without abnormal findings: Secondary | ICD-10-CM

## 2015-10-13 MED ORDER — RIZATRIPTAN BENZOATE 10 MG PO TBDP: ORAL_TABLET | ORAL | 3 refills | Status: DC

## 2015-10-13 NOTE — Patient Instructions (Signed)
Preventive Care for Adults, Female A healthy lifestyle and preventive care can promote health and wellness. Preventive health guidelines for women include the following key practices.  A routine yearly physical is a good way to check with your health care provider about your health and preventive screening. It is a chance to share any concerns and updates on your health and to receive a thorough exam.  Visit your dentist for a routine exam and preventive care every 6 months. Brush your teeth twice a day and floss once a day. Good oral hygiene prevents tooth decay and gum disease.  The frequency of eye exams is based on your age, health, family medical history, use of contact lenses, and other factors. Follow your health care provider's recommendations for frequency of eye exams.  Eat a healthy diet. Foods like vegetables, fruits, whole grains, low-fat dairy products, and lean protein foods contain the nutrients you need without too many calories. Decrease your intake of foods high in solid fats, added sugars, and salt. Eat the right amount of calories for you.Get information about a proper diet from your health care provider, if necessary.  Regular physical exercise is one of the most important things you can do for your health. Most adults should get at least 150 minutes of moderate-intensity exercise (any activity that increases your heart rate and causes you to sweat) each week. In addition, most adults need muscle-strengthening exercises on 2 or more days a week.  Maintain a healthy weight. The body mass index (BMI) is a screening tool to identify possible weight problems. It provides an estimate of body fat based on height and weight. Your health care provider can find your BMI and can help you achieve or maintain a healthy weight.For adults 20 years and older:  A BMI below 18.5 is considered underweight.  A BMI of 18.5 to 24.9 is normal.  A BMI of 25 to 29.9 is considered  overweight.  A BMI of 30 and above is considered obese.  Maintain normal blood lipids and cholesterol levels by exercising and minimizing your intake of saturated fat. Eat a balanced diet with plenty of fruit and vegetables. Blood tests for lipids and cholesterol should begin at age 28 and be repeated every 5 years. If your lipid or cholesterol levels are high, you are over 50, or you are at high risk for heart disease, you may need your cholesterol levels checked more frequently.Ongoing high lipid and cholesterol levels should be treated with medicines if diet and exercise are not working.  If you smoke, find out from your health care provider how to quit. If you do not use tobacco, do not start.  Lung cancer screening is recommended for adults aged 4-80 years who are at high risk for developing lung cancer because of a history of smoking. A yearly low-dose CT scan of the lungs is recommended for people who have at least a 30-pack-year history of smoking and are a current smoker or have quit within the past 15 years. A pack year of smoking is smoking an average of 1 pack of cigarettes a day for 1 year (for example: 1 pack a day for 30 years or 2 packs a day for 15 years). Yearly screening should continue until the smoker has stopped smoking for at least 15 years. Yearly screening should be stopped for people who develop a health problem that would prevent them from having lung cancer treatment.  If you are pregnant, do not drink alcohol. If you are  breastfeeding, be very cautious about drinking alcohol. If you are not pregnant and choose to drink alcohol, do not have more than 1 drink per day. One drink is considered to be 12 ounces (355 mL) of beer, 5 ounces (148 mL) of wine, or 1.5 ounces (44 mL) of liquor.  Avoid use of street drugs. Do not share needles with anyone. Ask for help if you need support or instructions about stopping the use of drugs.  High blood pressure causes heart disease and  increases the risk of stroke. Your blood pressure should be checked at least every 1 to 2 years. Ongoing high blood pressure should be treated with medicines if weight loss and exercise do not work.  If you are 25-78 years old, ask your health care provider if you should take aspirin to prevent strokes.  Diabetes screening is done by taking a blood sample to check your blood glucose level after you have not eaten for a certain period of time (fasting). If you are not overweight and you do not have risk factors for diabetes, you should be screened once every 3 years starting at age 86. If you are overweight or obese and you are 3-87 years of age, you should be screened for diabetes every year as part of your cardiovascular risk assessment.  Breast cancer screening is essential preventive care for women. You should practice "breast self-awareness." This means understanding the normal appearance and feel of your breasts and may include breast self-examination. Any changes detected, no matter how small, should be reported to a health care provider. Women in their 66s and 30s should have a clinical breast exam (CBE) by a health care provider as part of a regular health exam every 1 to 3 years. After age 43, women should have a CBE every year. Starting at age 37, women should consider having a mammogram (breast X-ray test) every year. Women who have a family history of breast cancer should talk to their health care provider about genetic screening. Women at a high risk of breast cancer should talk to their health care providers about having an MRI and a mammogram every year.  Breast cancer gene (BRCA)-related cancer risk assessment is recommended for women who have family members with BRCA-related cancers. BRCA-related cancers include breast, ovarian, tubal, and peritoneal cancers. Having family members with these cancers may be associated with an increased risk for harmful changes (mutations) in the breast  cancer genes BRCA1 and BRCA2. Results of the assessment will determine the need for genetic counseling and BRCA1 and BRCA2 testing.  Your health care provider may recommend that you be screened regularly for cancer of the pelvic organs (ovaries, uterus, and vagina). This screening involves a pelvic examination, including checking for microscopic changes to the surface of your cervix (Pap test). You may be encouraged to have this screening done every 3 years, beginning at age 78.  For women ages 79-65, health care providers may recommend pelvic exams and Pap testing every 3 years, or they may recommend the Pap and pelvic exam, combined with testing for human papilloma virus (HPV), every 5 years. Some types of HPV increase your risk of cervical cancer. Testing for HPV may also be done on women of any age with unclear Pap test results.  Other health care providers may not recommend any screening for nonpregnant women who are considered low risk for pelvic cancer and who do not have symptoms. Ask your health care provider if a screening pelvic exam is right for  you.  If you have had past treatment for cervical cancer or a condition that could lead to cancer, you need Pap tests and screening for cancer for at least 20 years after your treatment. If Pap tests have been discontinued, your risk factors (such as having a new sexual partner) need to be reassessed to determine if screening should resume. Some women have medical problems that increase the chance of getting cervical cancer. In these cases, your health care provider may recommend more frequent screening and Pap tests.  Colorectal cancer can be detected and often prevented. Most routine colorectal cancer screening begins at the age of 50 years and continues through age 75 years. However, your health care provider may recommend screening at an earlier age if you have risk factors for colon cancer. On a yearly basis, your health care provider may provide  home test kits to check for hidden blood in the stool. Use of a small camera at the end of a tube, to directly examine the colon (sigmoidoscopy or colonoscopy), can detect the earliest forms of colorectal cancer. Talk to your health care provider about this at age 50, when routine screening begins. Direct exam of the colon should be repeated every 5-10 years through age 75 years, unless early forms of precancerous polyps or small growths are found.  People who are at an increased risk for hepatitis B should be screened for this virus. You are considered at high risk for hepatitis B if:  You were born in a country where hepatitis B occurs often. Talk with your health care provider about which countries are considered high risk.  Your parents were born in a high-risk country and you have not received a shot to protect against hepatitis B (hepatitis B vaccine).  You have HIV or AIDS.  You use needles to inject street drugs.  You live with, or have sex with, someone who has hepatitis B.  You get hemodialysis treatment.  You take certain medicines for conditions like cancer, organ transplantation, and autoimmune conditions.  Hepatitis C blood testing is recommended for all people born from 1945 through 1965 and any individual with known risks for hepatitis C.  Practice safe sex. Use condoms and avoid high-risk sexual practices to reduce the spread of sexually transmitted infections (STIs). STIs include gonorrhea, chlamydia, syphilis, trichomonas, herpes, HPV, and human immunodeficiency virus (HIV). Herpes, HIV, and HPV are viral illnesses that have no cure. They can result in disability, cancer, and death.  You should be screened for sexually transmitted illnesses (STIs) including gonorrhea and chlamydia if:  You are sexually active and are younger than 24 years.  You are older than 24 years and your health care provider tells you that you are at risk for this type of infection.  Your sexual  activity has changed since you were last screened and you are at an increased risk for chlamydia or gonorrhea. Ask your health care provider if you are at risk.  If you are at risk of being infected with HIV, it is recommended that you take a prescription medicine daily to prevent HIV infection. This is called preexposure prophylaxis (PrEP). You are considered at risk if:  You are sexually active and do not regularly use condoms or know the HIV status of your partner(s).  You take drugs by injection.  You are sexually active with a partner who has HIV.  Talk with your health care provider about whether you are at high risk of being infected with HIV. If   you choose to begin PrEP, you should first be tested for HIV. You should then be tested every 3 months for as long as you are taking PrEP.  Osteoporosis is a disease in which the bones lose minerals and strength with aging. This can result in serious bone fractures or breaks. The risk of osteoporosis can be identified using a bone density scan. Women ages 1 years and over and women at risk for fractures or osteoporosis should discuss screening with their health care providers. Ask your health care provider whether you should take a calcium supplement or vitamin D to reduce the rate of osteoporosis.  Menopause can be associated with physical symptoms and risks. Hormone replacement therapy is available to decrease symptoms and risks. You should talk to your health care provider about whether hormone replacement therapy is right for you.  Use sunscreen. Apply sunscreen liberally and repeatedly throughout the day. You should seek shade when your shadow is shorter than you. Protect yourself by wearing long sleeves, pants, a wide-brimmed hat, and sunglasses year round, whenever you are outdoors.  Once a month, do a whole body skin exam, using a mirror to look at the skin on your back. Tell your health care provider of new moles, moles that have irregular  borders, moles that are larger than a pencil eraser, or moles that have changed in shape or color.  Stay current with required vaccines (immunizations).  Influenza vaccine. All adults should be immunized every year.  Tetanus, diphtheria, and acellular pertussis (Td, Tdap) vaccine. Pregnant women should receive 1 dose of Tdap vaccine during each pregnancy. The dose should be obtained regardless of the length of time since the last dose. Immunization is preferred during the 27th-36th week of gestation. An adult who has not previously received Tdap or who does not know her vaccine status should receive 1 dose of Tdap. This initial dose should be followed by tetanus and diphtheria toxoids (Td) booster doses every 10 years. Adults with an unknown or incomplete history of completing a 3-dose immunization series with Td-containing vaccines should begin or complete a primary immunization series including a Tdap dose. Adults should receive a Td booster every 10 years.  Varicella vaccine. An adult without evidence of immunity to varicella should receive 2 doses or a second dose if she has previously received 1 dose. Pregnant females who do not have evidence of immunity should receive the first dose after pregnancy. This first dose should be obtained before leaving the health care facility. The second dose should be obtained 4-8 weeks after the first dose.  Human papillomavirus (HPV) vaccine. Females aged 13-26 years who have not received the vaccine previously should obtain the 3-dose series. The vaccine is not recommended for use in pregnant females. However, pregnancy testing is not needed before receiving a dose. If a female is found to be pregnant after receiving a dose, no treatment is needed. In that case, the remaining doses should be delayed until after the pregnancy. Immunization is recommended for any person with an immunocompromised condition through the age of 24 years if she did not get any or all doses  earlier. During the 3-dose series, the second dose should be obtained 4-8 weeks after the first dose. The third dose should be obtained 24 weeks after the first dose and 16 weeks after the second dose.  Zoster vaccine. One dose is recommended for adults aged 97 years or older unless certain conditions are present.  Measles, mumps, and rubella (MMR) vaccine. Adults born  before 1957 generally are considered immune to measles and mumps. Adults born in [birth year] or later should have 1 or more doses of MMR vaccine unless there is a contraindication to the vaccine or there is laboratory evidence of immunity to each of the three diseases. A routine second dose of MMR vaccine should be obtained at least 28 days after the first dose for students attending postsecondary schools, health care workers, or international travelers. People who received inactivated measles vaccine or an unknown type of measles vaccine during 1963-1967 should receive 2 doses of MMR vaccine. People who received inactivated mumps vaccine or an unknown type of mumps vaccine before 1979 and are at high risk for mumps infection should consider immunization with 2 doses of MMR vaccine. For females of childbearing age, rubella immunity should be determined. If there is no evidence of immunity, females who are not pregnant should be vaccinated. If there is no evidence of immunity, females who are pregnant should delay immunization until after pregnancy. Unvaccinated health care workers born before 60 who lack laboratory evidence of measles, mumps, or rubella immunity or laboratory confirmation of disease should consider measles and mumps immunization with 2 doses of MMR vaccine or rubella immunization with 1 dose of MMR vaccine.  Pneumococcal 13-valent conjugate (PCV13) vaccine. When indicated, a person who is uncertain of his immunization history and has no record of immunization should receive the PCV13 vaccine. All adults 61 years of age and older  should receive this vaccine. An adult aged 92 years or older who has certain medical conditions and has not been previously immunized should receive 1 dose of PCV13 vaccine. This PCV13 should be followed with a dose of pneumococcal polysaccharide (PPSV23) vaccine. Adults who are at high risk for pneumococcal disease should obtain the PPSV23 vaccine at least 8 weeks after the dose of PCV13 vaccine. Adults older than 47 years of age who have normal immune system function should obtain the PPSV23 vaccine dose at least 1 year after the dose of PCV13 vaccine.  Pneumococcal polysaccharide (PPSV23) vaccine. When PCV13 is also indicated, PCV13 should be obtained first. All adults aged 2 years and older should be immunized. An adult younger than age 30 years who has certain medical conditions should be immunized. Any person who resides in a nursing home or long-term care facility should be immunized. An adult smoker should be immunized. People with an immunocompromised condition and certain other conditions should receive both PCV13 and PPSV23 vaccines. People with human immunodeficiency virus (HIV) infection should be immunized as soon as possible after diagnosis. Immunization during chemotherapy or radiation therapy should be avoided. Routine use of PPSV23 vaccine is not recommended for American Indians, [place] Natives, or people younger than 65 years unless there are medical conditions that require PPSV23 vaccine. When indicated, people who have unknown immunization and have no record of immunization should receive PPSV23 vaccine. One-time revaccination 5 years after the first dose of PPSV23 is recommended for people aged 19-64 years who have chronic kidney failure, nephrotic syndrome, asplenia, or immunocompromised conditions. People who received 1-2 doses of PPSV23 before age 44 years should receive another dose of PPSV23 vaccine at age 83 years or later if at least 5 years have passed since the previous dose. Doses  of PPSV23 are not needed for people immunized with PPSV23 at or after age 20 years.  Meningococcal vaccine. Adults with asplenia or persistent complement component deficiencies should receive 2 doses of quadrivalent meningococcal conjugate (MenACWY-D) vaccine. The doses should be obtained  at least 2 months apart. Microbiologists working with certain meningococcal bacteria, [place] recruits, people at risk during an outbreak, and people who travel to or live in countries with a high rate of meningitis should be immunized. A first-year college student up through age 28 years who is living in a residence hall should receive a dose if she did not receive a dose on or after her 16th birthday. Adults who have certain high-risk conditions should receive one or more doses of vaccine.  Hepatitis A vaccine. Adults who wish to be protected from this disease, have certain high-risk conditions, work with hepatitis A-infected animals, work in hepatitis A research labs, or travel to or work in countries with a high rate of hepatitis A should be immunized. Adults who were previously unvaccinated and who anticipate close contact with an international adoptee during the first 60 days after arrival in the [place] from a country with a high rate of hepatitis A should be immunized.  Hepatitis B vaccine. Adults who wish to be protected from this disease, have certain high-risk conditions, may be exposed to blood or other infectious body fluids, are household contacts or sex partners of hepatitis B positive people, are clients or workers in certain care facilities, or travel to or work in countries with a high rate of hepatitis B should be immunized.  Haemophilus influenzae type b (Hib) vaccine. A previously unvaccinated person with asplenia or sickle cell disease or having a scheduled splenectomy should receive 1 dose of Hib vaccine. Regardless of previous immunization, a recipient of a hematopoietic stem cell transplant  should receive a 3-dose series 6-12 months after her successful transplant. Hib vaccine is not recommended for adults with HIV infection. Preventive Services / Frequency Ages 71 to 87 years  Blood pressure check.** / Every 3-5 years.  Lipid and cholesterol check.** / Every 5 years beginning at age 1.  Clinical breast exam.** / Every 3 years for women in their 3s and 31s.  BRCA-related cancer risk assessment.** / For women who have family members with a BRCA-related cancer (breast, ovarian, tubal, or peritoneal cancers).  Pap test.** / Every 2 years from ages 50 through 86. Every 3 years starting at age 87 through age 7 or 75 with a history of 3 consecutive normal Pap tests.  HPV screening.** / Every 3 years from ages 59 through ages 35 to 6 with a history of 3 consecutive normal Pap tests.  Hepatitis C blood test.** / For any individual with known risks for hepatitis C.  Skin self-exam. / Monthly.  Influenza vaccine. / Every year.  Tetanus, diphtheria, and acellular pertussis (Tdap, Td) vaccine.** / Consult your health care provider. Pregnant women should receive 1 dose of Tdap vaccine during each pregnancy. 1 dose of Td every 10 years.  Varicella vaccine.** / Consult your health care provider. Pregnant females who do not have evidence of immunity should receive the first dose after pregnancy.  HPV vaccine. / 3 doses over 6 months, if 72 and younger. The vaccine is not recommended for use in pregnant females. However, pregnancy testing is not needed before receiving a dose.  Measles, mumps, rubella (MMR) vaccine.** / You need at least 1 dose of MMR if you were born in 1957 or later. You may also need a 2nd dose. For females of childbearing age, rubella immunity should be determined. If there is no evidence of immunity, females who are not pregnant should be vaccinated. If there is no evidence of immunity, females who are  pregnant should delay immunization until after  pregnancy.  Pneumococcal 13-valent conjugate (PCV13) vaccine.** / Consult your health care provider.  Pneumococcal polysaccharide (PPSV23) vaccine.** / 1 to 2 doses if you smoke cigarettes or if you have certain conditions.  Meningococcal vaccine.** / 1 dose if you are age 87 to 44 years and a Market researcher living in a residence hall, or have one of several medical conditions, you need to get vaccinated against meningococcal disease. You may also need additional booster doses.  Hepatitis A vaccine.** / Consult your health care provider.  Hepatitis B vaccine.** / Consult your health care provider.  Haemophilus influenzae type b (Hib) vaccine.** / Consult your health care provider. Ages 86 to 38 years  Blood pressure check.** / Every year.  Lipid and cholesterol check.** / Every 5 years beginning at age 49 years.  Lung cancer screening. / Every year if you are aged 71-80 years and have a 30-pack-year history of smoking and currently smoke or have quit within the past 15 years. Yearly screening is stopped once you have quit smoking for at least 15 years or develop a health problem that would prevent you from having lung cancer treatment.  Clinical breast exam.** / Every year after age 51 years.  BRCA-related cancer risk assessment.** / For women who have family members with a BRCA-related cancer (breast, ovarian, tubal, or peritoneal cancers).  Mammogram.** / Every year beginning at age 18 years and continuing for as long as you are in good health. Consult with your health care provider.  Pap test.** / Every 3 years starting at age 63 years through age 37 or 57 years with a history of 3 consecutive normal Pap tests.  HPV screening.** / Every 3 years from ages 41 years through ages 76 to 23 years with a history of 3 consecutive normal Pap tests.  Fecal occult blood test (FOBT) of stool. / Every year beginning at age 36 years and continuing until age 51 years. You may not need  to do this test if you get a colonoscopy every 10 years.  Flexible sigmoidoscopy or colonoscopy.** / Every 5 years for a flexible sigmoidoscopy or every 10 years for a colonoscopy beginning at age 36 years and continuing until age 35 years.  Hepatitis C blood test.** / For all people born from [birth year] through 1965 and any individual with known risks for hepatitis C.  Skin self-exam. / Monthly.  Influenza vaccine. / Every year.  Tetanus, diphtheria, and acellular pertussis (Tdap/Td) vaccine.** / Consult your health care provider. Pregnant women should receive 1 dose of Tdap vaccine during each pregnancy. 1 dose of Td every 10 years.  Varicella vaccine.** / Consult your health care provider. Pregnant females who do not have evidence of immunity should receive the first dose after pregnancy.  Zoster vaccine.** / 1 dose for adults aged 73 years or older.  Measles, mumps, rubella (MMR) vaccine.** / You need at least 1 dose of MMR if you were born in 1957 or later. You may also need a second dose. For females of childbearing age, rubella immunity should be determined. If there is no evidence of immunity, females who are not pregnant should be vaccinated. If there is no evidence of immunity, females who are pregnant should delay immunization until after pregnancy.  Pneumococcal 13-valent conjugate (PCV13) vaccine.** / Consult your health care provider.  Pneumococcal polysaccharide (PPSV23) vaccine.** / 1 to 2 doses if you smoke cigarettes or if you have certain conditions.  Meningococcal vaccine.** /  Consult your health care provider.  Hepatitis A vaccine.** / Consult your health care provider.  Hepatitis B vaccine.** / Consult your health care provider.  Haemophilus influenzae type b (Hib) vaccine.** / Consult your health care provider. Ages 39 years and over  Blood pressure check.** / Every year.  Lipid and cholesterol check.** / Every 5 years beginning at age 65 years.  Lung cancer  screening. / Every year if you are aged 67-80 years and have a 30-pack-year history of smoking and currently smoke or have quit within the past 15 years. Yearly screening is stopped once you have quit smoking for at least 15 years or develop a health problem that would prevent you from having lung cancer treatment.  Clinical breast exam.** / Every year after age 32 years.  BRCA-related cancer risk assessment.** / For women who have family members with a BRCA-related cancer (breast, ovarian, tubal, or peritoneal cancers).  Mammogram.** / Every year beginning at age 82 years and continuing for as long as you are in good health. Consult with your health care provider.  Pap test.** / Every 3 years starting at age 38 years through age 55 or 92 years with 3 consecutive normal Pap tests. Testing can be stopped between 65 and 70 years with 3 consecutive normal Pap tests and no abnormal Pap or HPV tests in the past 10 years.  HPV screening.** / Every 3 years from ages 53 years through ages 60 or 103 years with a history of 3 consecutive normal Pap tests. Testing can be stopped between 65 and 70 years with 3 consecutive normal Pap tests and no abnormal Pap or HPV tests in the past 10 years.  Fecal occult blood test (FOBT) of stool. / Every year beginning at age 78 years and continuing until age 62 years. You may not need to do this test if you get a colonoscopy every 10 years.  Flexible sigmoidoscopy or colonoscopy.** / Every 5 years for a flexible sigmoidoscopy or every 10 years for a colonoscopy beginning at age 27 years and continuing until age 59 years.  Hepatitis C blood test.** / For all people born from [birth year] through 1967 and any individual with known risks for hepatitis C.  Osteoporosis screening.** / A one-time screening for women ages 46 years and over and women at risk for fractures or osteoporosis.  Skin self-exam. / Monthly.  Influenza vaccine. / Every year.  Tetanus, diphtheria, and  acellular pertussis (Tdap/Td) vaccine.** / 1 dose of Td every 10 years.  Varicella vaccine.** / Consult your health care provider.  Zoster vaccine.** / 1 dose for adults aged 59 years or older.  Pneumococcal 13-valent conjugate (PCV13) vaccine.** / Consult your health care provider.  Pneumococcal polysaccharide (PPSV23) vaccine.** / 1 dose for all adults aged 29 years and older.  Meningococcal vaccine.** / Consult your health care provider.  Hepatitis A vaccine.** / Consult your health care provider.  Hepatitis B vaccine.** / Consult your health care provider.  Haemophilus influenzae type b (Hib) vaccine.** / Consult your health care provider. ** Family history and personal history of risk and conditions may change your health care provider's recommendations.   This information is not intended to replace advice given to you by your health care provider. Make sure you discuss any questions you have with your health care provider.   Document Released: 03/13/2001 Document Revised: 02/05/2014 Document Reviewed: 06/12/2010 Elsevier Interactive Patient Education [employer].

## 2015-10-13 NOTE — Progress Notes (Signed)
Subjective:   Caroline Mullen is a 45 y.o. 662P0 Caucasian female here for a routine well-woman exam.  No LMP recorded. Patient has had a hysterectomy.    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  Past Medical History Past Medical History:  Diagnosis Date  . Anxiety   . Connective tissue disease (HCC)    on Plaquenil    NO LONGER ON MEDS NO PROBLEMS X 6 YEARS  . Hyperthyroidism   . Migraine     Past Surgical History Past Surgical History:  Procedure Laterality Date  . ABDOMINAL HYSTERECTOMY     fibroid uterine tumors supracervical  . CESAREAN SECTION    . KNEE ARTHROSCOPY    . SHOULDER SURGERY  1998  . THYROIDECTOMY N/A 08/08/2015   Procedure: THYROIDECTOMY;  Surgeon: Vernie MurdersPaul Juengel, MD;  Location: [facility] ORS;  Service: ENT;  Laterality: N/A;  . TUBAL LIGATION      Gynecologic History G2P0  No LMP recorded. Patient has had a hysterectomy. Contraception: tubal ligation Last Pap: 2016. Results were: normal Last mammogram: 2017. Results were: normal  Obstetric History OB History  Gravida Para Term Preterm AB Living  2            SAB TAB Ectopic Multiple Live Births               # Outcome Date GA Lbr Len/2nd Weight Sex Delivery Anes PTL Lv  2 Gravida 2002    F CS-Unspec     1 Gravida               Current Medications Current Outpatient Prescriptions on File Prior to Visit  Medication Sig Dispense Refill  . cetirizine (ZYRTEC) 10 MG tablet Take 10 mg by mouth daily.     Marland Kitchen. ibuprofen  (ADVIL,MOTRIN) 200 MG tablet Take 200 mg by mouth every 6 (six) hours as needed for moderate pain.     . Multiple Vitamin (MULTIVITAMIN WITH MINERALS) TABS tablet Take 1 tablet by mouth daily.    . rizatriptan (MAXALT-MLT) 10 MG disintegrating tablet DISSOLVE 1 TABLET BY MOUTH AS DIRECTED AS NEEDED 10 tablet 3   No current facility-administered medications on file prior to visit.     Review of Systems Patient denies any headaches, blurred vision, shortness of breath, chest pain, abdominal pain, problems with bowel movements, urination, or intercourse.  Objective:  BP 133/76   Pulse 87   Ht 5\' 7"  (1.702 m)   Wt 162 lb 14.4 oz (73.9 kg)   BMI 25.51 kg/m  Physical Exam  General:  Well developed, well nourished, no acute distress. She is alert and oriented x3. Skin:  Warm and dry Neck:  Midline trachea, no thyromegaly or nodules Cardiovascular: Regular rate and rhythm, no murmur heard Lungs:  Effort normal, all lung fields clear to auscultation bilaterally Breasts:  No dominant palpable mass, retraction, or nipple discharge Abdomen:  Soft, non tender, no hepatosplenomegaly or masses Pelvic:  External genitalia is normal in appearance.  The vagina is normal in appearance. The cervix is bulbous, no CMT.  Thin prep  pap is not done. Uterus is felt to be normal size, shape, and contour.  No adnexal masses or tenderness noted. Extremities:  No swelling or varicosities noted Psych:  She has a normal mood and affect  Assessment:   Healthy well-woman exam S/P thyroidectomy   Plan:    F/U 1 year for AE, or sooner if needed   Melody Suzan Nailer, CNM

## 2016-02-07 DIAGNOSIS — M7702 Medial epicondylitis, left elbow: Secondary | ICD-10-CM | POA: Diagnosis not present

## 2016-02-23 DIAGNOSIS — E890 Postprocedural hypothyroidism: Secondary | ICD-10-CM | POA: Diagnosis not present

## 2016-03-02 DIAGNOSIS — E890 Postprocedural hypothyroidism: Secondary | ICD-10-CM | POA: Diagnosis not present

## 2016-04-06 DIAGNOSIS — D485 Neoplasm of uncertain behavior of skin: Secondary | ICD-10-CM | POA: Diagnosis not present

## 2016-04-06 DIAGNOSIS — L820 Inflamed seborrheic keratosis: Secondary | ICD-10-CM | POA: Diagnosis not present

## 2016-04-10 ENCOUNTER — Other Ambulatory Visit: Payer: Self-pay | Admitting: Obstetrics and Gynecology

## 2016-04-10 MED ORDER — ALPRAZOLAM 0.5 MG PO TABS: 0.5000 mg | ORAL_TABLET | Freq: Three times a day (TID) | ORAL | 1 refills | Status: DC | PRN

## 2016-04-17 DIAGNOSIS — M9902 Segmental and somatic dysfunction of thoracic region: Secondary | ICD-10-CM | POA: Diagnosis not present

## 2016-04-17 DIAGNOSIS — M5387 Other specified dorsopathies, lumbosacral region: Secondary | ICD-10-CM | POA: Diagnosis not present

## 2016-04-17 DIAGNOSIS — M531 Cervicobrachial syndrome: Secondary | ICD-10-CM | POA: Diagnosis not present

## 2016-05-04 ENCOUNTER — Other Ambulatory Visit: Payer: Self-pay

## 2016-05-04 DIAGNOSIS — E890 Postprocedural hypothyroidism: Secondary | ICD-10-CM

## 2016-05-04 DIAGNOSIS — Z13220 Encounter for screening for lipoid disorders: Secondary | ICD-10-CM

## 2016-05-05 LAB — LIPID PANEL
Chol/HDL Ratio: 5.1 ratio — ABNORMAL HIGH (ref 0.0–4.4)
Cholesterol, Total: 173 mg/dL (ref 100–199)
HDL: 34 mg/dL — ABNORMAL LOW (ref >39)
LDL Calculated: 114 mg/dL — ABNORMAL HIGH (ref 0–99)
Triglycerides: 124 mg/dL (ref 0–149)
VLDL Cholesterol Cal: 25 mg/dL (ref 5–40)

## 2016-05-05 LAB — TSH+FREE T4
Free T4: 1.86 ng/dL — ABNORMAL HIGH (ref 0.82–1.77)
TSH: 1.17 u[IU]/mL (ref 0.450–4.500)

## 2016-05-05 LAB — T3, FREE: T3, Free: 3 pg/mL (ref 2.0–4.4)

## 2016-05-11 DIAGNOSIS — E039 Hypothyroidism, unspecified: Secondary | ICD-10-CM | POA: Diagnosis not present

## 2016-05-28 DIAGNOSIS — M9903 Segmental and somatic dysfunction of lumbar region: Secondary | ICD-10-CM | POA: Diagnosis not present

## 2016-05-28 DIAGNOSIS — M9904 Segmental and somatic dysfunction of sacral region: Secondary | ICD-10-CM | POA: Diagnosis not present

## 2016-05-28 DIAGNOSIS — M5386 Other specified dorsopathies, lumbar region: Secondary | ICD-10-CM | POA: Diagnosis not present

## 2016-05-28 DIAGNOSIS — M436 Torticollis: Secondary | ICD-10-CM | POA: Diagnosis not present

## 2016-05-30 DIAGNOSIS — M9904 Segmental and somatic dysfunction of sacral region: Secondary | ICD-10-CM | POA: Diagnosis not present

## 2016-05-30 DIAGNOSIS — M5386 Other specified dorsopathies, lumbar region: Secondary | ICD-10-CM | POA: Diagnosis not present

## 2016-05-30 DIAGNOSIS — M9903 Segmental and somatic dysfunction of lumbar region: Secondary | ICD-10-CM | POA: Diagnosis not present

## 2016-05-30 DIAGNOSIS — M436 Torticollis: Secondary | ICD-10-CM | POA: Diagnosis not present

## 2016-06-19 DIAGNOSIS — M5387 Other specified dorsopathies, lumbosacral region: Secondary | ICD-10-CM | POA: Diagnosis not present

## 2016-06-19 DIAGNOSIS — M531 Cervicobrachial syndrome: Secondary | ICD-10-CM | POA: Diagnosis not present

## 2016-06-19 DIAGNOSIS — M436 Torticollis: Secondary | ICD-10-CM | POA: Diagnosis not present

## 2016-06-19 DIAGNOSIS — M9904 Segmental and somatic dysfunction of sacral region: Secondary | ICD-10-CM | POA: Diagnosis not present

## 2016-07-03 DIAGNOSIS — L905 Scar conditions and fibrosis of skin: Secondary | ICD-10-CM | POA: Diagnosis not present

## 2016-07-06 ENCOUNTER — Ambulatory Visit: Payer: Self-pay | Admitting: Registered Nurse

## 2016-07-06 VITALS — BP 124/82 | HR 58 | Temp 98.6°F | Wt 153.4 lb

## 2016-07-06 DIAGNOSIS — J0101 Acute recurrent maxillary sinusitis: Secondary | ICD-10-CM

## 2016-07-06 DIAGNOSIS — J301 Allergic rhinitis due to pollen: Secondary | ICD-10-CM

## 2016-07-06 DIAGNOSIS — H6593 Unspecified nonsuppurative otitis media, bilateral: Secondary | ICD-10-CM

## 2016-07-06 DIAGNOSIS — J029 Acute pharyngitis, unspecified: Secondary | ICD-10-CM

## 2016-07-06 MED ORDER — FLUCONAZOLE 150 MG PO TABS: ORAL_TABLET | ORAL | 0 refills | Status: DC

## 2016-07-06 MED ORDER — FLUTICASONE PROPIONATE 50 MCG/ACT NA SUSP: 1.0000 | Freq: Two times a day (BID) | NASAL | 0 refills | Status: DC

## 2016-07-06 MED ORDER — AMOXICILLIN 875 MG PO TABS: 875.0000 mg | ORAL_TABLET | Freq: Two times a day (BID) | ORAL | 0 refills | Status: DC

## 2016-07-06 MED ORDER — SALINE SPRAY 0.65 % NA SOLN: 2.0000 | NASAL | 0 refills | Status: DC

## 2016-07-06 NOTE — Progress Notes (Signed)
Subjective:    Patient ID: Caroline Mullen, female    DOB: May 08, 1970, 46 y.o.   MRN: [id number]  45y/o caucasian female here for sore throat and ear pain denied discharge ?fever at home on advil OTC.  Post nasal drip, nasal congestion.  Recently switched from zyrtec to claritin seems to be working better than zyrtec.  Nasal saline prn helping also.  Had stopped flonase after peak pollen season reached but still has at home.  Tends to get vulvovaginal yeast with antibiotic use request diflucan refill as has worked well for her in the past sometimes needs a second dose      Review of Systems  Constitutional: Negative for activity change, appetite change, chills, diaphoresis, fatigue, fever and unexpected weight change.  HENT: Positive for congestion, ear pain, postnasal drip, rhinorrhea, sinus pressure and sore throat. Negative for dental problem, drooling, ear discharge, facial swelling, hearing loss, mouth sores, nosebleeds, sinus pain, sneezing, tinnitus, trouble swallowing and voice change.   Eyes: Negative for photophobia, pain, discharge, redness, itching and visual disturbance.  Respiratory: Negative for cough, choking, chest tightness, shortness of breath, wheezing and stridor.   Cardiovascular: Negative for chest pain, palpitations and leg swelling.  Gastrointestinal: Negative for abdominal distention, abdominal pain, blood in stool, constipation, diarrhea, nausea and vomiting.  Endocrine: Negative for cold intolerance and heat intolerance.  Genitourinary: Negative for difficulty urinating, dysuria and hematuria.  Musculoskeletal: Negative for arthralgias, back pain, gait problem, joint swelling, myalgias, neck pain and neck stiffness.  Skin: Negative for color change, pallor, rash and wound.  Allergic/Immunologic: Positive for environmental allergies. Negative for food allergies.  Neurological: Negative for dizziness, tremors, seizures, syncope, facial asymmetry, speech difficulty,  weakness, light-headedness, numbness and headaches.  Hematological: Negative for adenopathy. Does not bruise/bleed easily.  Psychiatric/Behavioral: Negative for agitation, behavioral problems, confusion and sleep disturbance.       Objective:   Physical Exam  Constitutional: She is oriented to person, place, and time. Vital signs are normal. She appears well-developed and well-nourished. She is active and cooperative.  Non-toxic appearance. She does not have a sickly appearance. She appears ill. No distress.  HENT:  Head: Normocephalic and atraumatic.  Right Ear: Hearing, external ear and ear canal normal. Tympanic membrane is bulging. A middle ear effusion is present.  Left Ear: Hearing, external ear and ear canal normal. Tympanic membrane is bulging. A middle ear effusion is present.  Nose: Mucosal edema and rhinorrhea present. No nose lacerations, sinus tenderness, nasal deformity, septal deviation or nasal septal hematoma. No epistaxis.  No foreign bodies. Right sinus exhibits maxillary sinus tenderness. Right sinus exhibits no frontal sinus tenderness. Left sinus exhibits maxillary sinus tenderness. Left sinus exhibits no frontal sinus tenderness.  Mouth/Throat: Uvula is midline and mucous membranes are normal. Mucous membranes are not pale, not dry and not cyanotic. She does not have dentures. No oral lesions. No trismus in the jaw. Normal dentition. No dental abscesses, uvula swelling, lacerations or dental caries. Posterior oropharyngeal edema and posterior oropharyngeal erythema present. No oropharyngeal exudate or tonsillar abscesses.  Cobblestoning posterior pharynx; macular erythema oropharynx; bilateral allergic shiners; left TM air fluid level central opacity and bulging; right TM air fluid level clear; bilateral nasal turbinates edema/erythema  Eyes: Conjunctivae, EOM and lids are normal. Pupils are equal, round, and reactive to light. Right eye exhibits no chemosis, no discharge, no  exudate and no hordeolum. No foreign body present in the right eye. Left eye exhibits no chemosis, no discharge, no exudate and no  hordeolum. No foreign body present in the left eye. Right conjunctiva is not injected. Right conjunctiva has no hemorrhage. Left conjunctiva is not injected. Left conjunctiva has no hemorrhage. No scleral icterus. Right eye exhibits normal extraocular motion and no nystagmus. Left eye exhibits normal extraocular motion and no nystagmus. Right pupil is round and reactive. Left pupil is round and reactive. Pupils are equal.  Neck: Trachea normal, normal range of motion and phonation normal. Neck supple. No tracheal tenderness, no spinous process tenderness and no muscular tenderness present. No neck rigidity. No tracheal deviation, no edema, no erythema and normal range of motion present. No thyroid mass and no thyromegaly present.  Cardiovascular: Normal rate, regular rhythm, S1 normal, S2 normal, normal heart sounds and intact distal pulses.  PMI is not displaced.  Exam reveals no gallop and no friction rub.   No murmur heard. Pulmonary/Chest: Effort normal and breath sounds normal. No accessory muscle usage or stridor. No respiratory distress. She has no decreased breath sounds. She has no wheezes. She has no rhonchi. She has no rales. She exhibits no tenderness.  No cough observed in exam room; speaks full sentences without difficulty  Abdominal: Soft. She exhibits no distension.  Musculoskeletal: Normal range of motion. She exhibits no edema or tenderness.       Right shoulder: Normal.       Left shoulder: Normal.       Right hip: Normal.       Left hip: Normal.       Right knee: Normal.       Left knee: Normal.       Cervical back: Normal.       Right hand: Normal.       Left hand: Normal.  Lymphadenopathy:       Head (right side): No submental, no submandibular, no tonsillar, no preauricular, no posterior auricular and no occipital adenopathy present.       Head  (left side): No submental, no submandibular, no tonsillar, no preauricular, no posterior auricular and no occipital adenopathy present.    She has no cervical adenopathy.       Right cervical: No superficial cervical, no deep cervical and no posterior cervical adenopathy present.      Left cervical: No superficial cervical, no deep cervical and no posterior cervical adenopathy present.  Neurological: She is alert and oriented to person, place, and time. She has normal strength. She is not disoriented. She displays no atrophy and no tremor. No cranial nerve deficit or sensory deficit. She exhibits normal muscle tone. She displays no seizure activity. Coordination and gait normal. GCS eye subscore is 4. GCS verbal subscore is 5. GCS motor subscore is 6.  Gait sure and steady in hallway; on/off exam table without difficulty  Skin: Skin is warm, dry and intact. No abrasion, no bruising, no burn, no ecchymosis, no laceration, no lesion, no petechiae and no rash noted. She is not diaphoretic. No cyanosis or erythema. No pallor. Nails show no clubbing.  Psychiatric: She has a normal mood and affect. Her speech is normal and behavior is normal. Judgment and thought content normal. Cognition and memory are normal.  Nursing note and vitals reviewed.         Assessment & Plan:  A-recurrent maxillary sinusitis acute; seasonal allergic rhinitis and bilateral otitis media effusion  P-Supportive treatment.   No evidence of invasive bacterial infection, non toxic and well hydrated.  This is most likely self limiting viral infection.  I do not  see where any further testing or imaging is necessary at this time.   I will suggest supportive care, rest, good hygiene and encourage the patient to take adequate fluids.  The patient is to return to clinic or EMERGENCY ROOM if symptoms worsen or change significantly e.g. ear pain, fever, purulent discharge from ears or bleeding.  Exitcare handout on otitis media with  effusion given to patient.  Patient verbalized agreement and understanding of treatment plan.     Patient may use normal saline nasal spray as needed.  Continue claritin 10mg  po daily and restart flonase 1 spray each nostril BID nasal steroid use.  Avoid triggers if possible.  Shower prior to bedtime if exposed to triggers.  If allergic dust/dust mites recommend mattress/pillow covers/encasements; washing linens, vacuuming, sweeping, dusting weekly.  Call or return to clinic as needed if these symptoms worsen or fail to improve as anticipated.   Exitcare handout on allergic rhinitis given to patient.  Patient verbalized understanding of instructions, agreed with plan of care and had no further questions at this time.  P2:  Avoidance and hand washing.  Viral illness and allergies common in community: no evidence of invasive bacterial infection, non toxic and well hydrated.  This is most likely self limiting viral infection.  I do not see where any further testing or imaging is necessary at this time.   I will suggest supportive care, rest, good hygiene and encourage the patient to take adequate fluids.  Does not require work excuse.  Notified patient staff will call with culture results once available next 48+ hours.  Sudafed 30mg  po q4-6h prn; flonase 1 spray each nostril BID prn, nasal saline 1-2 sprays each nostril prn q2h, motrin 800mg  po TID prn.  Discussed honey with lemon and salt water gargles for comfort also.  The patient is to return to clinic or EMERGENCY ROOM if symptoms worsen or change significantly e.g. fever, lethargy, SOB, wheezing.  Exitcare handout on viral illness given to patient.  Patient verbalized agreement and understanding of treatment plan.    No evidence of systemic bacterial infection, non toxic and well hydrated.  I do not see where any further testing or imaging is necessary at this time.   I will suggest supportive care, rest, good hygiene and encourage the patient to take  adequate fluids.  The patient is to return to clinic or EMERGENCY ROOM if symptoms worsen or change significantly.  Exitcare handout on sinusitis given to patient.  Patient verbalized agreement and understanding of treatment plan and had no further questions at this time.   P2:  Hand washing and cover cough  Restart flonase 1 spray each nostril BID, saline 2 sprays each nostril q2h prn congestion.  If no improvement with 48 hours of saline and flonase use start amoxcillin 875mg  po BID x 10 days.  Rx given.  No evidence of systemic bacterial infection, non toxic and well hydrated.  I do not see where any further testing or imaging is necessary at this time.   I will suggest supportive care, rest, good hygiene and encourage the patient to take adequate fluids.  The patient is to return to clinic or EMERGENCY ROOM if symptoms worsen or change significantly.  Exitcare handout on sinusitis given to patient.  Patient verbalized agreement and understanding of treatment plan and had no further questions at this time.   P2:  Hand washing and cover cough  advil 800mg  po TID prn pain.  Usually no specific medical treatment is  needed if a virus is causing the sore throat.  The throat most often gets better on its own within 5 to 7 days.  Antibiotic medicine does not cure viral pharyngitis.   For acute pharyngitis caused by bacteria, your healthcare provider will prescribe an antibiotic.  Marland Kitchen Do not smoke.  Marland Kitchen Avoid secondhand smoke and other air pollutants.  . Use a cool mist humidifier to add moisture to the air.  . Get plenty of rest.  . You may want to rest your throat by talking less and eating a diet that is mostly liquid or soft for a day or two.   Marland Kitchen Nonprescription throat lozenges and mouthwashes should help relieve the soreness.   . Gargling with warm saltwater and drinking warm liquids may help.  (You can make a saltwater solution by adding 1/4 teaspoon of salt to 8 ounces, or 240 mL, of warm water.)  . A  nonprescription pain reliever such as aspirin, acetaminophen, or ibuprofen may ease general aches and pains.   FOLLOW UP with clinic provider if no improvements in the next 7-10 days.  Patient verbalized understanding of instructions and agreed with plan of care. P2:  Hand washing and diet.

## 2016-07-06 NOTE — Patient Instructions (Addendum)
Sinusitis, Adult Sinusitis is soreness and inflammation of your sinuses. Sinuses are hollow spaces in the bones around your face. Your sinuses are located:  Around your eyes.  In the middle of your forehead.  Behind your nose.  In your cheekbones.  Your sinuses and nasal passages are lined with a stringy fluid (mucus). Mucus normally drains out of your sinuses. When your nasal tissues become inflamed or swollen, the mucus can become trappAllergic Rhinitis Allergic rhinitis is when the mucous membranes in the nose respond to allergens. Allergens are particles in the air that cause your body to have an allergic reaction. This causes you to release allergic antibodies. Through a chain of events, these eventually cause you to release histamine into the blood stream. Although meant to protect the body, it is this release of histamine that causes your discomfort, such as frequent sneezing, congestion, and an itchy, runny nose. What are the causes? Seasonal allergic rhinitis (hay fever) is caused by pollen allergens that may come from grasses, trees, and weeds. Year-round allergic rhinitis (perennial allergic rhinitis) is caused by allergens such as house dust mites, pet dander, and mold spores. What are the signs or symptoms?  Nasal stuffiness (congestion).  Itchy, runny nose with sneezing and tearing of the eyes. How is this diagnosed? Your health care provider can help you determine the allergen or allergens that trigger your symptoms. If you and your health care provider are unable to determine the allergen, skin or blood testing may be used. Your health care provider will diagnose your condition after taking your health history and performing a physical exam. Your health care provider may assess you for other related conditions, such as asthma, pink eye, or an ear infection. How is this treated? Allergic rhinitis does not have a cure, but it can be controlled by:  Medicines that block allergy  symptoms. These may include allergy shots, nasal sprays, and oral antihistamines.  Avoiding the allergen.  Hay fever may often be treated with antihistamines in pill or nasal spray forms. Antihistamines block the effects of histamine. There are over-the-counter medicines that may help with nasal congestion and swelling around the eyes. Check with your health care provider before taking or giving this medicine. If avoiding the allergen or the medicine prescribed do not work, there are many new medicines your health care provider can prescribe. Stronger medicine may be used if initial measures are ineffective. Desensitizing injections can be used if medicine and avoidance does not work. Desensitization is when a patient is given ongoing shots until the body becomes less sensitive to the allergen. Make sure you follow up with your health care provider if problems continue. Follow these instructions at home: It is not possible to completely avoid allergens, but you can reduce your symptoms by taking steps to limit your exposure to them. It helps to know exactly what you are allergic to so that you can avoid your specific triggers. Contact a health care provider if:  You have a fever.  You develop a cough that does not stop easily (persistent).  You have shortness of breath.  You start wheezing.  Symptoms interfere with normal daily activities. This information is not intended to replace advice given to you by your health care provider. Make sure you discuss any questions you have with your health care provider. Document Released: 10/10/2000 Document Revised: 09/16/2015 Document Reviewed: 09/22/2012 Elsevier Interactive Patient Education  2017 [employer] Sinus Rinse What is a sinus rinse? A sinus rinse is a simple  home treatment that is used to rinse your sinuses with a sterile mixture of salt and water (saline solution). Sinuses are air-filled spaces in your skull behind the bones of your  face and forehead that open into your nasal cavity. You will use the following:  Saline solution.  Neti pot or spray bottle. This releases the saline solution into your nose and through your sinuses. Neti pots and spray bottles can be purchased at [employer], a health food store, or online.  When would I do a sinus rinse? A sinus rinse can help to clear mucus, dirt, dust, or pollen from the nasal cavity. You may do a sinus rinse when you have a cold, a virus, nasal allergy symptoms, a sinus infection, or stuffiness in the nose or sinuses. If you are considering a sinus rinse:  Ask your child's health care provider before performing a sinus rinse on your child.  Do not do a sinus rinse if you have had ear or nasal surgery, ear infection, or blocked ears.  How do I do a sinus rinse?  Wash your hands.  Disinfect your device according to the directions provided and then dry it.  Use the solution that comes with your device or one that is sold separately in stores. Follow the mixing directions on the package.  Fill your device with the amount of saline solution as directed by the device instructions.  Stand over a sink and tilt your head sideways over the sink.  Place the spout of the device in your upper nostril (the one closer to the ceiling).  Gently pour or squeeze the saline solution into the nasal cavity. The liquid should drain to the lower nostril if you are not overly congested.  Gently blow your nose. Blowing too hard may cause ear pain.  Repeat in the other nostril.  Clean and rinse your device with clean water and then air-dry it. Are there risks of a sinus rinse? Sinus rinse is generally very safe and effective. However, there are a few risks, which include:  A burning sensation in the sinuses. This may happen if you do not make the saline solution as directed. Make sure to follow all directions when making the saline solution.  Infection from contaminated  water. This is rare, but possible.  Nasal irritation.  This information is not intended to replace advice given to you by your health care provider. Make sure you discuss any questions you have with your health care provider. Document Released: 08/12/2013 Document Revised: 12/13/2015 Document Reviewed: 06/02/2013 Elsevier Interactive Patient Education  2017 [employer]  Otitis Media, Adult Otitis media is redness, soreness, and puffiness (swelling) in the space just behind your eardrum (middle ear). It may be caused by allergies or infection. It often happens along with a cold. Follow these instructions at home:  Take your medicine as told. Finish it even if you start to feel better.  Only take over-the-counter or prescription medicines for pain, discomfort, or fever as told by your doctor.  Follow up with your doctor as told. Contact a doctor if:  You have otitis media only in one ear, or bleeding from your nose, or both.  You notice a lump on your neck.  You are not getting better in 3-5 days.  You feel worse instead of better. Get help right away if:  You have pain that is not helped with medicine.  You have puffiness, redness, or pain around your ear.  You get a stiff neck.  You cannot move part of your face (paralysis).  You notice that the bone behind your ear hurts when you touch it. This information is not intended to replace advice given to you by your health care provider. Make sure you discuss any questions you have with your health care provider. Document Released: 07/04/2007 Document Revised: 06/23/2015 Document Reviewed: 08/12/2012 Elsevier Interactive Patient Education  2017 [employer] Pharyngitis Pharyngitis is redness, pain, and swelling (inflammation) of your pharynx. What are the causes? Pharyngitis is usually caused by infection. Most of the time, these infections are from viruses (viral) and are part of a cold. However, sometimes pharyngitis is  caused by bacteria (bacterial). Pharyngitis can also be caused by allergies. Viral pharyngitis may be spread from person to person by coughing, sneezing, and personal items or utensils (cups, forks, spoons, toothbrushes). Bacterial pharyngitis may be spread from person to person by more intimate contact, such as kissing. What are the signs or symptoms? Symptoms of pharyngitis include:  Sore throat.  Tiredness (fatigue).  Low-grade fever.  Headache.  Joint pain and muscle aches.  Skin rashes.  Swollen lymph nodes.  Plaque-like film on throat or tonsils (often seen with bacterial pharyngitis).  How is this diagnosed? Your health care provider will ask you questions about your illness and your symptoms. Your medical history, along with a physical exam, is often all that is needed to diagnose pharyngitis. Sometimes, a rapid strep test is done. Other lab tests may also be done, depending on the suspected cause. How is this treated? Viral pharyngitis will usually get better in 3-4 days without the use of medicine. Bacterial pharyngitis is treated with medicines that kill germs (antibiotics). Follow these instructions at home:  Drink enough water and fluids to keep your urine clear or pale yellow.  Only take over-the-counter or prescription medicines as directed by your health care provider: ? If you are prescribed antibiotics, make sure you finish them even if you start to feel better. ? Do not take aspirin.  Get lots of rest.  Gargle with 8 oz of salt water ( tsp of salt per 1 qt of water) as often as every 1-2 hours to soothe your throat.  Throat lozenges (if you are not at risk for choking) or sprays may be used to soothe your throat. Contact a health care provider if:  You have large, tender lumps in your neck.  You have a rash.  You cough up green, yellow-brown, or bloody spit. Get help right away if:  Your neck becomes stiff.  You drool or are unable to swallow  liquids.  You vomit or are unable to keep medicines or liquids down.  You have severe pain that does not go away with the use of recommended medicines.  You have trouble breathing (not caused by a stuffy nose). This information is not intended to replace advice given to you by your health care provider. Make sure you discuss any questions you have with your health care provider. Document Released: 01/15/2005 Document Revised: 06/23/2015 Document Reviewed: 09/22/2012 Elsevier Interactive Patient Education  2017 [employer] ed or blocked so air cannot flow through your sinuses. This allows bacteria, viruses, and funguses to grow, which leads to infection. Sinusitis can develop quickly and last for 7?10 days (acute) or for more than 12 weeks (chronic). Sinusitis often develops after a cold. What are the causes? This condition is caused by anything that creates swelling in the sinuses or stops mucus from draining, including:  Allergies.  Asthma.  Bacterial or viral infection.  Abnormally shaped bones between the nasal passages.  Nasal growths that contain mucus (nasal polyps).  Narrow sinus openings.  Pollutants, such as chemicals or irritants in the air.  A foreign object stuck in the nose.  A fungal infection. This is rare.  What increases the risk? The following factors may make you more likely to develop this condition:  Having allergies or asthma.  Having had a recent cold or respiratory tract infection.  Having structural deformities or blockages in your nose or sinuses.  Having a weak immune system.  Doing a lot of swimming or diving.  Overusing nasal sprays.  Smoking.  What are the signs or symptoms? The main symptoms of this condition are pain and a feeling of pressure around the affected sinuses. Other symptoms include:  Upper toothache.  Earache.  Headache.  Bad breath.  Decreased sense of smell and taste.  A cough that may get worse at  night.  Fatigue.  Fever.  Thick drainage from your nose. The drainage is often green and it may contain pus (purulent).  Stuffy nose or congestion.  Postnasal drip. This is when extra mucus collects in the throat or back of the nose.  Swelling and warmth over the affected sinuses.  Sore throat.  Sensitivity to light.  How is this diagnosed? This condition is diagnosed based on symptoms, a medical history, and a physical exam. To find out if your condition is acute or chronic, your health care provider may:  Look in your nose for signs of nasal polyps.  Tap over the affected sinus to check for signs of infection.  View the inside of your sinuses using an imaging device that has a light attached (endoscope).  If your health care provider suspects that you have chronic sinusitis, you may also:  Be tested for allergies.  Have a sample of mucus taken from your nose (nasal culture) and checked for bacteria.  Have a mucus sample examined to see if your sinusitis is related to an allergy.  If your sinusitis does not respond to treatment and it lasts longer than 8 weeks, you may have an MRI or CT scan to check your sinuses. These scans also help to determine how severe your infection is. In rare cases, a bone biopsy may be done to rule out more serious types of fungal sinus disease. How is this treated? Treatment for sinusitis depends on the cause and whether your condition is chronic or acute. If a virus is causing your sinusitis, your symptoms will go away on their own within 10 days. You may be given medicines to relieve your symptoms, including:  Topical nasal decongestants. They shrink swollen nasal passages and let mucus drain from your sinuses.  Antihistamines. These drugs block inflammation that is triggered by allergies. This can help to ease swelling in your nose and sinuses.  Topical nasal corticosteroids. These are nasal sprays that ease inflammation and swelling in  your nose and sinuses.  Nasal saline washes. These rinses can help to get rid of thick mucus in your nose.  If your condition is caused by bacteria, you will be given an antibiotic medicine. If your condition is caused by a fungus, you will be given an antifungal medicine. Surgery may be needed to correct underlying conditions, such as narrow nasal passages. Surgery may also be needed to remove polyps. Follow these instructions at home: Medicines  Take, use, or apply over-the-counter and prescription medicines only as told by your health  care provider. These may include nasal sprays.  If you were prescribed an antibiotic medicine, take it as told by your health care provider. Do not stop taking the antibiotic even if you start to feel better. Hydrate and Humidify  Drink enough water to keep your urine clear or pale yellow. Staying hydrated will help to thin your mucus.  Use a cool mist humidifier to keep the humidity level in your home above 50%.  Inhale steam for 10-15 minutes, 3-4 times a day or as told by your health care provider. You can do this in the bathroom while a hot shower is running.  Limit your exposure to cool or dry air. Rest  Rest as much as possible.  Sleep with your head raised (elevated).  Make sure to get enough sleep each night. General instructions  Apply a warm, moist washcloth to your face 3-4 times a day or as told by your health care provider. This will help with discomfort.  Wash your hands often with soap and water to reduce your exposure to viruses and other germs. If soap and water are not available, use hand sanitizer.  Do not smoke. Avoid being around people who are smoking (secondhand smoke).  Keep all follow-up visits as told by your health care provider. This is important. Contact a health care provider if:  You have a fever.  Your symptoms get worse.  Your symptoms do not improve within 10 days. Get help right away if:  You have a  severe headache.  You have persistent vomiting.  You have pain or swelling around your face or eyes.  You have vision problems.  You develop confusion.  Your neck is stiff.  You have trouble breathing. This information is not intended to replace advice given to you by your health care provider. Make sure you discuss any questions you have with your health care provider. Document Released: 01/15/2005 Document Revised: 09/11/2015 Document Reviewed: 11/10/2014 Elsevier Interactive Patient Education  2017 [employer].

## 2016-08-03 DIAGNOSIS — M5387 Other specified dorsopathies, lumbosacral region: Secondary | ICD-10-CM | POA: Diagnosis not present

## 2016-08-03 DIAGNOSIS — M9904 Segmental and somatic dysfunction of sacral region: Secondary | ICD-10-CM | POA: Diagnosis not present

## 2016-08-03 DIAGNOSIS — M436 Torticollis: Secondary | ICD-10-CM | POA: Diagnosis not present

## 2016-08-03 DIAGNOSIS — M531 Cervicobrachial syndrome: Secondary | ICD-10-CM | POA: Diagnosis not present

## 2016-08-30 ENCOUNTER — Other Ambulatory Visit: Payer: Self-pay | Admitting: Obstetrics and Gynecology

## 2016-08-30 DIAGNOSIS — Z1231 Encounter for screening mammogram for malignant neoplasm of breast: Secondary | ICD-10-CM

## 2016-10-11 ENCOUNTER — Other Ambulatory Visit: Payer: Self-pay | Admitting: Obstetrics and Gynecology

## 2016-10-12 ENCOUNTER — Ambulatory Visit
Admission: RE | Admit: 2016-10-12 | Discharge: 2016-10-12 | Disposition: A | Discharge status: Home / Self Care | Payer: BLUE CROSS/BLUE SHIELD | Source: Ambulatory Visit | Attending: Obstetrics and Gynecology | Admitting: Obstetrics and Gynecology

## 2016-10-12 DIAGNOSIS — Z1231 Encounter for screening mammogram for malignant neoplasm of breast: Secondary | ICD-10-CM

## 2016-10-18 ENCOUNTER — Ambulatory Visit (INDEPENDENT_AMBULATORY_CARE_PROVIDER_SITE_OTHER): Payer: BLUE CROSS/BLUE SHIELD | Admitting: Obstetrics and Gynecology

## 2016-10-18 ENCOUNTER — Encounter: Payer: Self-pay | Admitting: Obstetrics and Gynecology

## 2016-10-18 VITALS — BP 114/76 | HR 70 | Ht 67.0 in | Wt 152.9 lb

## 2016-10-18 DIAGNOSIS — E041 Nontoxic single thyroid nodule: Secondary | ICD-10-CM | POA: Diagnosis not present

## 2016-10-18 DIAGNOSIS — Z01419 Encounter for gynecological examination (general) (routine) without abnormal findings: Secondary | ICD-10-CM

## 2016-10-18 MED ORDER — ALPRAZOLAM 0.5 MG PO TABS: 0.5000 mg | ORAL_TABLET | Freq: Three times a day (TID) | ORAL | 1 refills | Status: DC | PRN

## 2016-10-18 NOTE — Progress Notes (Signed)
Subjective:   Caroline Mullen is a 46 y.o. G31P0 Caucasian female here for a routine well-woman exam.  No LMP recorded. Patient has had a hysterectomy.    Current complaints: spotting x 2 after sex. PCP: me       doesn't desire labs  Social History: Sexual: heterosexual Marital Status: married Living situation: with family Occupation: unknown occupation Tobacco/alcohol: no tobacco use Illicit drugs: no history of illicit drug use  The following portions of the patient's history were reviewed and updated as appropriate: allergies, current medications, past family history, past medical history, past social history, past surgical history and problem list.  Past Medical History Past Medical History:  Diagnosis Date  . Anxiety   . Connective tissue disease (HCC)    on Plaquenil    NO LONGER ON MEDS NO PROBLEMS X 6 YEARS  . Hyperthyroidism   . Migraine     Past Surgical History Past Surgical History:  Procedure Laterality Date  . ABDOMINAL HYSTERECTOMY     fibroid uterine tumors supracervical  . CESAREAN SECTION    . KNEE ARTHROSCOPY    . SHOULDER SURGERY  1998  . THYROIDECTOMY N/A 08/08/2015   Procedure: THYROIDECTOMY;  Surgeon: Vernie Murders, MD;  Location: [facility] ORS;  Service: ENT;  Laterality: N/A;  . TUBAL LIGATION      Gynecologic History G2P0  No LMP recorded. Patient has had a hysterectomy. Contraception: tubal ligation Last Pap: 2016. Results were: normal Last mammogram: 2018. Results were: normal   Obstetric History OB History  Gravida Para Term Preterm AB Living  2            SAB TAB Ectopic Multiple Live Births               # Outcome Date GA Lbr Len/2nd Weight Sex Delivery Anes PTL Lv  2 Gravida 2002    F CS-Unspec     1 Gravida               Current Medications Current Outpatient Prescriptions on File Prior to Visit  Medication Sig Dispense Refill  . ALPRAZolam (XANAX) 0.5 MG tablet TAKE 1 TABLET BY MOUTH THREE TIMES DAILY AS NEEDED FOR ANXIETY 45  tablet 1  . ibuprofen (ADVIL,MOTRIN) 200 MG tablet Take 200 mg by mouth every 6 (six) hours as needed for moderate pain.     Marland Kitchen levothyroxine (SYNTHROID, LEVOTHROID) 112 MCG tablet Take 1 tablet by mouth daily.  5  . levothyroxine (SYNTHROID, LEVOTHROID) 125 MCG tablet Take 125 mcg by mouth daily before breakfast.    . Multiple Vitamin (MULTIVITAMIN WITH MINERALS) TABS tablet Take 1 tablet by mouth daily.    Marland Kitchen amoxicillin (AMOXIL) 875 MG tablet Take 1 tablet (875 mg total) by mouth 2 (two) times daily. (Patient not taking: Reported on 10/18/2016) 20 tablet 0  . fluconazole (DIFLUCAN) 150 MG tablet Take 1 dose at onset symptoms and may repeat by mouth in 72 hours if needed (Patient not taking: Reported on 10/18/2016) 2 tablet 0  . fluticasone (FLONASE) 50 MCG/ACT nasal spray Place 1 spray into both nostrils 2 (two) times daily. 16 g 0  . sodium chloride (OCEAN) 0.65 % SOLN nasal spray Place 2 sprays into both nostrils every 2 (two) hours while awake.  0   No current facility-administered medications on file prior to visit.     Review of Systems Patient denies any headaches, blurred vision, shortness of breath, chest pain, abdominal pain, problems with bowel movements, urination, or intercourse.  Objective:  BP 114/76   Pulse 70   Ht  (1.702 m)   Wt 152 lb 14.4 oz (69.4 kg)   BMI 23.95 kg/m  Physical Exam  General:  Well developed, well nourished, no acute distress. She is alert and oriented x3. Skin:  Warm and dry Neck:  Midline trachea, no thyromegaly or nodules Cardiovascular: Regular rate and rhythm, no murmur heard Lungs:  Effort normal, all lung fields clear to auscultation bilaterally Breasts:  No dominant palpable mass, retraction, or nipple discharge Abdomen:  Soft, non tender, no hepatosplenomegaly or masses Pelvic:  External genitalia is normal in appearance.  The vagina is normal in appearance. The cervix is bulbous, no CMT.  Thin prep pap is not done . Uterus is surgically  absent.  No adnexal masses or tenderness noted. Extremities:  No swelling or varicosities noted Psych:  She has a normal mood and affect  Assessment:   Healthy well-woman exam S/p thyroidectomy  Plan:   F/U 1 year for AE, or sooner if needed Mammogram due next year  Shanele Nissan Suzan Nailer, [place]

## 2016-10-19 LAB — COMPREHENSIVE METABOLIC PANEL
ALT: 16 IU/L (ref 0–32)
AST: 15 IU/L (ref 0–40)
Albumin/Globulin Ratio: 2 (ref 1.2–2.2)
Albumin: 4.3 g/dL (ref 3.5–5.5)
Alkaline Phosphatase: 59 IU/L (ref 39–117)
BUN/Creatinine Ratio: 15 (ref 9–23)
BUN: 12 mg/dL (ref 6–24)
Bilirubin Total: 0.5 mg/dL (ref 0.0–1.2)
CO2: 25 mmol/L (ref 20–29)
Calcium: 9.3 mg/dL (ref 8.7–10.2)
Chloride: 101 mmol/L (ref 96–106)
Creatinine, Ser: 0.79 mg/dL (ref 0.57–1.00)
GFR calc Af Amer: 103 mL/min/{1.73_m2} (ref >59)
GFR calc non Af Amer: 89 mL/min/{1.73_m2} (ref >59)
Globulin, Total: 2.2 g/dL (ref 1.5–4.5)
Glucose: 85 mg/dL (ref 65–99)
Potassium: 4 mmol/L (ref 3.5–5.2)
Sodium: 142 mmol/L (ref 134–144)
Total Protein: 6.5 g/dL (ref 6.0–8.5)

## 2016-10-19 LAB — VITAMIN D 25 HYDROXY (VIT D DEFICIENCY, FRACTURES): Vit D, 25-Hydroxy: 36.7 ng/mL (ref 30.0–100.0)

## 2016-10-19 LAB — LIPID PANEL
Chol/HDL Ratio: 4.5 ratio — ABNORMAL HIGH (ref 0.0–4.4)
Cholesterol, Total: 158 mg/dL (ref 100–199)
HDL: 35 mg/dL — ABNORMAL LOW (ref >39)
LDL Calculated: 91 mg/dL (ref 0–99)
Triglycerides: 162 mg/dL — ABNORMAL HIGH (ref 0–149)
VLDL Cholesterol Cal: 32 mg/dL (ref 5–40)

## 2016-10-19 LAB — THYROID PANEL WITH TSH
Free Thyroxine Index: 2.8 (ref 1.2–4.9)
T3 Uptake Ratio: 29 % (ref 24–39)
T4, Total: 9.6 ug/dL (ref 4.5–12.0)
TSH: 5.4 u[IU]/mL — ABNORMAL HIGH (ref 0.450–4.500)

## 2016-10-23 ENCOUNTER — Encounter: Payer: Self-pay | Admitting: Obstetrics and Gynecology

## 2016-10-27 LAB — T3, FREE: T3, Free: 2.5 pg/mL (ref 2.0–4.4)

## 2016-10-27 LAB — T4, FREE: Free T4: 1.66 ng/dL (ref 0.82–1.77)

## 2016-10-27 LAB — SPECIMEN STATUS REPORT

## 2016-10-30 DIAGNOSIS — E039 Hypothyroidism, unspecified: Secondary | ICD-10-CM | POA: Diagnosis not present

## 2016-12-14 ENCOUNTER — Encounter: Payer: Self-pay | Admitting: Adult Health

## 2016-12-14 ENCOUNTER — Ambulatory Visit
Admission: RE | Admit: 2016-12-14 | Discharge: 2016-12-14 | Disposition: A | Discharge status: Home / Self Care | Payer: BLUE CROSS/BLUE SHIELD | Source: Ambulatory Visit | Attending: Adult Health | Admitting: Adult Health

## 2016-12-14 ENCOUNTER — Ambulatory Visit: Payer: Self-pay | Admitting: Adult Health

## 2016-12-14 VITALS — BP 128/78 | HR 74 | Temp 98.5°F | Resp 16 | Wt 157.0 lb

## 2016-12-14 DIAGNOSIS — M542 Cervicalgia: Secondary | ICD-10-CM

## 2016-12-14 DIAGNOSIS — R519 Headache, unspecified: Secondary | ICD-10-CM

## 2016-12-14 DIAGNOSIS — R590 Localized enlarged lymph nodes: Secondary | ICD-10-CM

## 2016-12-14 DIAGNOSIS — R51 Headache: Secondary | ICD-10-CM

## 2016-12-14 DIAGNOSIS — H6591 Unspecified nonsuppurative otitis media, right ear: Secondary | ICD-10-CM

## 2016-12-14 LAB — CBC WITH DIFFERENTIAL/PLATELET
Basophils Absolute: 0 10*3/uL (ref 0.0–0.2)
Basos: 0 %
EOS (ABSOLUTE): 0.1 10*3/uL (ref 0.0–0.4)
Eos: 1 %
Hematocrit: 42.4 % (ref 34.0–46.6)
Hemoglobin: 14.7 g/dL (ref 11.1–15.9)
Immature Grans (Abs): 0 10*3/uL (ref 0.0–0.1)
Immature Granulocytes: 0 %
Lymphocytes Absolute: 2.3 10*3/uL (ref 0.7–3.1)
Lymphs: 34 %
MCH: 32 pg (ref 26.6–33.0)
MCHC: 34.7 g/dL (ref 31.5–35.7)
MCV: 92 fL (ref 79–97)
Monocytes Absolute: 0.4 10*3/uL (ref 0.1–0.9)
Monocytes: 6 %
Neutrophils Absolute: 4 10*3/uL (ref 1.4–7.0)
Neutrophils: 59 %
Platelets: 213 10*3/uL (ref 150–379)
RBC: 4.59 x10E6/uL (ref 3.77–5.28)
RDW: 12.8 % (ref 12.3–15.4)
WBC: 6.8 10*3/uL (ref 3.4–10.8)

## 2016-12-14 MED ORDER — CYCLOBENZAPRINE HCL 10 MG PO TABS: 10.0000 mg | ORAL_TABLET | Freq: Every day | ORAL | 0 refills | Status: DC

## 2016-12-14 MED ORDER — PREDNISONE 10 MG (21) PO TBPK: ORAL_TABLET | ORAL | 0 refills | Status: DC

## 2016-12-14 MED ORDER — AMOXICILLIN-POT CLAVULANATE 875-125 MG PO TABS: 1.0000 | ORAL_TABLET | Freq: Two times a day (BID) | ORAL | 0 refills | Status: DC

## 2016-12-14 NOTE — Addendum Note (Signed)
Addended by: Berniece PapFLINCHUM, Russell Quinney S on: 12/14/2016 01:37 PM   Modules accepted: Orders

## 2016-12-14 NOTE — Patient Instructions (Signed)
Otitis Media, Adult Otitis media is redness, soreness, and puffiness (swelling) in the space just behind your eardrum (middle ear). It may be caused by allergies or infection. It often happens along with a cold. Follow these instructions at home:  Take your medicine as told. Finish it even if you start to feel better.  Only take over-the-counter or prescription medicines for pain, discomfort, or fever as told by your doctor.  Follow up with your doctor as told. Contact a doctor if:  You have otitis media only in one ear, or bleeding from your nose, or both.  You notice a lump on your neck.  You are not getting better in 3-5 days.  You feel worse instead of better. Get help right away if:  You have pain that is not helped with medicine.  You have puffiness, redness, or pain around your ear.  You get a stiff neck.  You cannot move part of your face (paralysis).  You notice that the bone behind your ear hurts when you touch it. This information is not intended to replace advice given to you by your health care provider. Make sure you discuss any questions you have with your health care provider. Document Released: 07/04/2007 Document Revised: 06/23/2015 Document Reviewed: 08/12/2012 Elsevier Interactive Patient Education  2017 [employer] Muscle Pain, Adult Muscle pain (myalgia) may be mild or severe. In most cases, the pain lasts only a short time and it goes away without treatment. It is normal to feel some muscle pain after starting a workout program. Muscles that have not been used often will be sore at first. Muscle pain may also be caused by many other things, including:  Overuse or muscle strain, especially if you are not in shape. This is the most common cause of muscle pain.  Injury.  Bruises.  Viruses, such as the flu.  Infectious diseases.  A chronic condition that causes muscle tenderness, fatigue, and headache (fibromyalgia).  A condition, such as lupus,  in which the body's disease-fighting system attacks other organs in the body (autoimmune or rheumatologic diseases).  Certain drugs, including ACE inhibitors and statins.  To diagnose the cause of your muscle pain, your health care provider will do a physical exam and ask questions about the pain and when it began. If you have not had muscle pain for very long, your health care provider may want to wait before doing much testing. If your muscle pain has lasted a long time, your health care provider may want to run tests right away. In some cases, this may include tests to rule out certain conditions or illnesses. Treatment for muscle pain depends on the cause. Home care is often enough to relieve muscle pain. Your health care provider may also prescribe anti-inflammatory medicine. Follow these instructions at home: Activity  If overuse is causing your muscle pain: ? Slow down your activities until the pain goes away. ? Do regular, gentle exercises if you are not usually active. ? Warm up before exercising. Stretch before and after exercising. This can help lower the risk of muscle pain.  Do not continue working out if the pain is very bad. Bad pain could mean that you have injured a muscle. Managing pain and discomfort   If directed, apply ice to the sore muscle: ? Put ice in a plastic bag. ? Place a towel between your skin and the bag. ? Leave the ice on for 20 minutes, 2-3 times a day.  You may also alternate between applying  ice and applying heat as told by your health care provider. To apply heat, use the heat source that your health care provider recommends, such as a moist heat pack or a heating pad. ? Place a towel between your skin and the heat source. ? Leave the heat on for 20-30 minutes. ? Remove the heat if your skin turns bright red. This is especially important if you are unable to feel pain, heat, or cold. You may have a greater risk of getting burned. Medicines  Take  over-the-counter and prescription medicines only as told by your health care provider.  Do not drive or use heavy machinery while taking prescription pain medicine. Contact a health care provider if:  Your muscle pain gets worse and medicines do not help.  You have muscle pain that lasts longer than 3 days.  You have a rash or fever along with muscle pain.  You have muscle pain after a tick bite.  You have muscle pain while working out, even though you are in good physical condition.  You have redness, soreness, or swelling along with muscle pain.  You have muscle pain after starting a new medicine or changing the dose of a medicine. Get help right away if:  You have trouble breathing.  You have trouble swallowing.  You have muscle pain along with a stiff neck, fever, and vomiting.  You have severe muscle weakness or cannot move part of your body. This information is not intended to replace advice given to you by your health care provider. Make sure you discuss any questions you have with your health care provider. Document Released: 12/07/2005 Document Revised: 08/05/2015 Document Reviewed: 06/07/2015 Elsevier Interactive Patient Education  2018 [employer]
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
## 2017-02-01 ENCOUNTER — Other Ambulatory Visit: Payer: Self-pay

## 2017-02-01 DIAGNOSIS — Z0000 Encounter for general adult medical examination without abnormal findings: Secondary | ICD-10-CM

## 2017-02-02 LAB — T3, FREE: T3, Free: 2.5 pg/mL (ref 2.0–4.4)

## 2017-02-02 LAB — T4, FREE: Free T4: 1.89 ng/dL — ABNORMAL HIGH (ref 0.82–1.77)

## 2017-02-02 LAB — TSH: TSH: 1.91 u[IU]/mL (ref 0.450–4.500)

## 2017-02-05 DIAGNOSIS — E039 Hypothyroidism, unspecified: Secondary | ICD-10-CM | POA: Diagnosis not present

## 2017-02-08 DIAGNOSIS — M436 Torticollis: Secondary | ICD-10-CM | POA: Diagnosis not present

## 2017-02-08 DIAGNOSIS — M9901 Segmental and somatic dysfunction of cervical region: Secondary | ICD-10-CM | POA: Diagnosis not present

## 2017-02-08 DIAGNOSIS — M5386 Other specified dorsopathies, lumbar region: Secondary | ICD-10-CM | POA: Diagnosis not present

## 2017-02-08 DIAGNOSIS — M25561 Pain in right knee: Secondary | ICD-10-CM | POA: Diagnosis not present

## 2017-02-15 ENCOUNTER — Encounter: Payer: Self-pay | Admitting: Adult Health

## 2017-02-15 ENCOUNTER — Ambulatory Visit: Payer: Self-pay | Admitting: Adult Health

## 2017-02-15 VITALS — BP 110/70 | HR 72 | Temp 98.3°F | Resp 16 | Ht 67.0 in | Wt 157.0 lb

## 2017-02-15 DIAGNOSIS — J0100 Acute maxillary sinusitis, unspecified: Secondary | ICD-10-CM

## 2017-02-15 MED ORDER — FLUCONAZOLE 150 MG PO TABS: 150.0000 mg | ORAL_TABLET | Freq: Once | ORAL | 0 refills | Status: AC

## 2017-02-15 MED ORDER — AMOXICILLIN-POT CLAVULANATE 875-125 MG PO TABS: 1.0000 | ORAL_TABLET | Freq: Two times a day (BID) | ORAL | 0 refills | Status: DC

## 2017-02-15 NOTE — Progress Notes (Signed)
Patient ID: Caroline Mullen, female   DOB: 01-21-1971, 47 y.o.   MRN: [id number]   Subjective:     Patient ID: Caroline Mullen, female   DOB: [birth year], 47 y.o.   MRN: [id number]  HPI Patinet is a 47 year old femle in no  Since Christmas nasal congestion, face pressure , nasal drainage. She reports symptoms have worsened despite over the counter medication.  Non smoker now. Quit 5 years ago +  No ill contacts recently.  Denies any chronic sinusitis or recent infections.   Right ear pain, no discharge  OTC Advil cold and sinus            Allergies  Allergen Reactions  . Morphine     REACTION: rash    Review of Systems  Constitutional: Negative for chills, diaphoresis, fever, malaise/fatigue and weight loss.  HENT: Positive for congestion, ear pain and sinus pain (" pressure " ). Negative for sore throat.   Eyes: Negative.   Respiratory: Negative.  Negative for stridor.   Cardiovascular: Negative.   Gastrointestinal: Negative.   Genitourinary: Negative.   Musculoskeletal: Negative.   Skin: Negative for itching and rash.  Neurological: Negative for weakness.  Endo/Heme/Allergies: Negative.        Denies any rash   Psychiatric/Behavioral: Negative.        Objective:   Physical Exam  Constitutional: She is oriented to person, place, and time and well-developed, well-nourished, and in no distress. Vital signs are normal. No distress.  HENT:  Head: Normocephalic and atraumatic.  Right Ear: Hearing, external ear and ear canal normal. Tympanic membrane is erythematous. A middle ear effusion is present.  Left Ear: Hearing, tympanic membrane, external ear and ear canal normal. Tympanic membrane is not erythematous. No decreased hearing is noted.  Nose: Mucosal edema and rhinorrhea present. Right sinus exhibits maxillary sinus tenderness. Right sinus exhibits no frontal sinus tenderness. Left sinus exhibits maxillary sinus tenderness. Left sinus exhibits no frontal  sinus tenderness.  Mouth/Throat: Uvula is midline and mucous membranes are normal. No uvula swelling. Posterior oropharyngeal erythema present. No oropharyngeal exudate, posterior oropharyngeal edema or tonsillar abscesses.  Eyes: Conjunctivae and EOM are normal. Pupils are equal, round, and reactive to light. Right eye exhibits no discharge. Left eye exhibits no discharge. No scleral icterus.  Neck: Normal range of motion. Neck supple. No JVD present. No tracheal deviation present.  Cardiovascular: Normal rate, regular rhythm, normal heart sounds and intact distal pulses. Exam reveals no gallop and no friction rub.  No murmur heard. Pulmonary/Chest: Effort normal and breath sounds normal. No stridor. No respiratory distress. She has no wheezes. She has no rales. She exhibits no tenderness.  Abdominal: Soft. Bowel sounds are normal. She exhibits no distension.  Musculoskeletal: Normal range of motion.  Patient moves on and off of exam table and in room without difficulty. Gait is normal in hall and in room. Patient is oriented to person place time and situation. Patient answers questions appropriately and engages in conversation.   Lymphadenopathy:    She has no cervical adenopathy.  Neurological: She is alert and oriented to person, place, and time. Gait normal.  Skin: Skin is warm and dry. No rash noted. She is not diaphoretic. No cyanosis or erythema. No pallor. Nails show no clubbing.  Psychiatric: Mood, memory, affect and judgment normal.  Vitals reviewed.        Assessment:   Acute non-recurrent maxillary sinusitis patient prefers Diflucan over Terazol - last liver function normal and denies  any issues with past use.  Meds ordered this encounter  Medications  . amoxicillin-clavulanate (AUGMENTIN) 875-125 MG tablet    Sig: Take 1 tablet by mouth 2 (two) times daily.    Dispense:  20 tablet    Refill:  0  . fluconazole (DIFLUCAN) 150 MG tablet    Sig: Take 1 tablet (150 mg  total) by mouth once for 1 dose.    Dispense:  1 tablet    Refill:  0   She reports yeast with antibiotic use only.   Advised to return to clinic for an appointment if no improvement within 72 hours or if any symptoms change or worsen. Advised ER or urgent Care if after hours or on weekend. 911 for emergency symptoms at any time.   ] Patient verbalized understanding of all instructions given and denies any further questions at this time.

## 2017-02-15 NOTE — Patient Instructions (Signed)
Amoxicillin; Clavulanic Acid chewable tablets What is this medicine? AMOXICILLIN; CLAVULANIC ACID (a mox i SIL in; KLAV yoo lan ic AS id) is a penicillin antibiotic. It is used to treat certain kinds of bacterial infections. It It will not work for colds, flu, or other viral infections. This medicine may be used for other purposes; ask your health care provider or pharmacist if you have questions. COMMON BRAND NAME(S): Augmentin What should I tell my health care provider before I take this medicine? They need to know if you have any of these conditions: -bowel disease, like colitis -kidney disease -liver disease -mononucleosis -phenylketonuria -an unusual or allergic reaction to amoxicillin, penicillin, cephalosporin, other antibiotics, clavulanic acid, other medicines, foods, dyes, or preservatives -pregnant or trying to get pregnant -breast-feeding How should I use this medicine? Take this medicine by mouth. Chew it completely before swallowing. Follow the directions on the prescription label. Take this medicine at the start of a meal or snack. Take your medicine at regular intervals. Do not take your medicine more often than directed. Take all of your medicine as directed even if you think you are better. Do not skip doses or stop your medicine early. Talk to your pediatrician regarding the use of this medicine in children. While this drug may be prescribed for selected conditions, precautions do apply. Overdosage: If you think you have taken too much of this medicine contact a poison control center or emergency room at once. NOTE: This medicine is only for you. Do not share this medicine with others. What if I miss a dose? If you miss a dose, take it as soon as you can. If it is almost time for your next dose, take only that dose. Do not take double or extra doses. What may interact with this medicine? -allopurinol -anticoagulants -birth control pills -methotrexate -probenecid This  list may not describe all possible interactions. Give your health care provider a list of all the medicines, herbs, non-prescription drugs, or dietary supplements you use. Also tell them if you smoke, drink alcohol, or use illegal drugs. Some items may interact with your medicine. What should I watch for while using this medicine? Tell your doctor or health care professional if your symptoms do not improve. Do not treat diarrhea with over the counter products. Contact your doctor if you have diarrhea that lasts more than 2 days or if it is severe and watery. If you have diabetes, you may get a false-positive result for sugar in your urine. Check with your doctor or health care professional. Birth control pills may not work properly while you are taking this medicine. Talk to your doctor about using an extra method of birth control. What side effects may I notice from receiving this medicine? Side effects that you should report to your doctor or health care professional as soon as possible: -allergic reactions like skin rash, itching or hives, swelling of the face, lips, or tongue -breathing problems -dark urine -fever or chills, sore throat -redness, blistering, peeling or loosening of the skin, including inside the mouth -seizures -trouble passing urine or change in the amount of urine -unusual bleeding, bruising -unusually weak or tired -white patches or sores in the mouth or throat Side effects that usually do not require medical attention (report to your doctor or health care professional if they continue or are bothersome): -diarrhea -dizziness -headache -nausea, vomiting -stomach upset -vaginal or anal irritation This list may not describe all possible side effects. Call your doctor for medical advice   about side effects. You may report side effects to FDA at 1-800-FDA-1088. Where should I keep my medicine? Keep out of the reach of children. Store at room temperature below 25 degrees  C (77 degrees F). Keep container tightly closed. Throw away any unused medicine after the expiration date. NOTE: This sheet is a summary. It may not cover all possible information. If you have questions about this medicine, talk to your doctor, pharmacist, or health care provider.  2018 Elsevier/Gold Standard (2007-04-10 11:38:22) Sinusitis, Adult Sinusitis is soreness and inflammation of your sinuses. Sinuses are hollow spaces in the bones around your face. They are located:  Around your eyes.  In the middle of your forehead.  Behind your nose.  In your cheekbones.  Your sinuses and nasal passages are lined with a stringy fluid (mucus). Mucus normally drains out of your sinuses. When your nasal tissues get inflamed or swollen, the mucus can get trapped or blocked so air cannot flow through your sinuses. This lets bacteria, viruses, and funguses grow, and that leads to infection. Follow these instructions at home: Medicines  Take, use, or apply over-the-counter and prescription medicines only as told by your doctor. These may include nasal sprays.  If you were prescribed an antibiotic medicine, take it as told by your doctor. Do not stop taking the antibiotic even if you start to feel better. Hydrate and Humidify  Drink enough water to keep your pee (urine) clear or pale yellow.  Use a cool mist humidifier to keep the humidity level in your home above 50%.  Breathe in steam for 10-15 minutes, 3-4 times a day or as told by your doctor. You can do this in the bathroom while a hot shower is running.  Try not to spend time in cool or dry air. Rest  Rest as much as possible.  Sleep with your head raised (elevated).  Make sure to get enough sleep each night. General instructions  Put a warm, moist washcloth on your face 3-4 times a day or as told by your doctor. This will help with discomfort.  Wash your hands often with soap and water. If there is no soap and water, use hand  sanitizer.  Do not smoke. Avoid being around people who are smoking (secondhand smoke).  Keep all follow-up visits as told by your doctor. This is important. Contact a doctor if:  You have a fever.  Your symptoms get worse.  Your symptoms do not get better within 10 days. Get help right away if:  You have a very bad headache.  You cannot stop throwing up (vomiting).  You have pain or swelling around your face or eyes.  You have trouble seeing.  You feel confused.  Your neck is stiff.  You have trouble breathing. This information is not intended to replace advice given to you by your health care provider. Make sure you discuss any questions you have with your health care provider. Document Released: 07/04/2007 Document Revised: 09/11/2015 Document Reviewed: 11/10/2014 Elsevier Interactive Patient Education  2018 [employer]. Otitis Media, Adult Otitis media is redness, soreness, and puffiness (swelling) in the space just behind your eardrum (middle ear). It may be caused by allergies or infection. It often happens along with a cold. Follow these instructions at home:  Take your medicine as told. Finish it even if you start to feel better.  Only take over-the-counter or prescription medicines for pain, discomfort, or fever as told by your doctor.  Follow up with your doctor  as told. Contact a doctor if:  You have otitis media only in one ear, or bleeding from your nose, or both.  You notice a lump on your neck.  You are not getting better in 3-5 days.  You feel worse instead of better. Get help right away if:  You have pain that is not helped with medicine.  You have puffiness, redness, or pain around your ear.  You get a stiff neck.  You cannot move part of your face (paralysis).  You notice that the bone behind your ear hurts when you touch it. This information is not intended to replace advice given to you by your health care provider. Make sure you  discuss any questions you have with your health care provider. Document Released: 07/04/2007 Document Revised: 06/23/2015 Document Reviewed: 08/12/2012 Elsevier Interactive Patient Education  2017 [employer]

## 2017-02-19 DIAGNOSIS — L821 Other seborrheic keratosis: Secondary | ICD-10-CM | POA: Diagnosis not present

## 2017-05-14 ENCOUNTER — Ambulatory Visit: Payer: BLUE CROSS/BLUE SHIELD | Admitting: Obstetrics and Gynecology

## 2017-05-14 ENCOUNTER — Encounter: Payer: Self-pay | Admitting: Obstetrics and Gynecology

## 2017-05-14 VITALS — BP 116/78 | HR 88 | Ht 67.0 in | Wt 160.9 lb

## 2017-05-14 DIAGNOSIS — K625 Hemorrhage of anus and rectum: Secondary | ICD-10-CM

## 2017-05-14 DIAGNOSIS — Z9889 Other specified postprocedural states: Secondary | ICD-10-CM | POA: Diagnosis not present

## 2017-05-14 DIAGNOSIS — Z86010 Personal history of colonic polyps: Secondary | ICD-10-CM | POA: Diagnosis not present

## 2017-05-14 NOTE — Patient Instructions (Signed)
Rectal Bleeding Rectal bleeding is when blood passes out of the anus. People with rectal bleeding may notice bright red blood in their underwear or in the toilet after having a bowel movement. They may also have dark red or black stools. Rectal bleeding is usually a sign that something is wrong. Many things can cause rectal bleeding, including:  Hemorrhoids. These are blood vessels in the anus or rectum that are larger than normal.  Fistulas. These are abnormal passages in the rectum and anus.  Anal fissures. This is a tear in the anus.  Diverticulosis. This is a condition in which pockets or sacs project from the bowel.  Proctitis and colitis. These are conditions in which the rectum, colon, or anus become inflamed.  Polyps. These are growths that can be cancerous (malignant) or non-cancerous (benign).  Part of the rectum sticking out from the anus (rectal prolapse).  Certain medicines.  Intestinal infections.  Follow these instructions at home: Pay attention to any changes in your symptoms. Take these actions to help lessen bleeding and discomfort:  Eat a diet that is high in fiber. This will keep your stool soft, making it easier to pass stools without straining. Ask your health care provider what foods and drinks are high in fiber.  Drink enough fluid to keep your urine clear or pale yellow. This also helps to keep your stool soft.  Try taking a warm bath. This may help soothe any pain in your rectum.  Keep all follow-up visits as told by your health care provider. This is important.  Get help right away if:  You have new or increased rectal bleeding.  You have black or dark red stools.  You vomit blood or something that looks like coffee grounds.  You have pain or tenderness in your abdomen.  You have a fever.  You feel weak.  You feel nauseous.  You faint.  You have severe pain in your rectum.  You cannot have a bowel movement. This information is not  intended to replace advice given to you by your health care provider. Make sure you discuss any questions you have with your health care provider. Document Released: 07/07/2001 Document Revised: 06/23/2015 Document Reviewed: 03/13/2015 Elsevier Interactive Patient Education  2018 Elsevier Inc.  

## 2017-05-14 NOTE — Progress Notes (Signed)
Subjective:     Patient ID: Caroline Mullen, female   DOB: 1970-06-27, 47 y.o.   MRN: [id number]  HPI Noted dark red stool 3 days ago with slight pink on toilet tissue, followed by another small dark stool this am, turning water pink. Denies pain with BM, constipation, or any rectal penetration/trauma.  Does have history of colon polyps and hemorrhoidectomy in past.   Review of Systems Negative except stated above in HPI    Objective:   Physical Exam A&Ox4 Well groomed female in no distress Blood pressure 116/78, pulse 88, height 5\' 7"  (1.702 m), weight 160 lb 14.4 oz (73 kg). Abdomen soft and not tender. Rectal exam: negative without mass, lesions or tenderness, stool guaiac positive. Dark red blood noted on glove, no stool in lower colon at time of exam.     Assessment:     Rectal bleeding H/o hemorrhoidectomy H/o colon polyps    Plan:     Referred to GI for colonoscopy.  Caroline Mullen,CNM

## 2017-05-16 ENCOUNTER — Other Ambulatory Visit: Payer: Self-pay

## 2017-05-16 DIAGNOSIS — E039 Hypothyroidism, unspecified: Secondary | ICD-10-CM

## 2017-05-17 LAB — T3, FREE: T3, Free: 2.6 pg/mL (ref 2.0–4.4)

## 2017-05-17 LAB — T4, FREE: Free T4: 1.84 ng/dL — ABNORMAL HIGH (ref 0.82–1.77)

## 2017-05-17 LAB — TSH: TSH: 3.59 u[IU]/mL (ref 0.450–4.500)

## 2017-06-02 ENCOUNTER — Ambulatory Visit
Admission: EM | Admit: 2017-06-02 | Discharge: 2017-06-02 | Disposition: A | Discharge status: Home / Self Care | Payer: BLUE CROSS/BLUE SHIELD | Attending: Family Medicine | Admitting: Family Medicine

## 2017-06-02 ENCOUNTER — Encounter: Payer: Self-pay | Admitting: Gynecology

## 2017-06-02 DIAGNOSIS — W260XXA Contact with knife, initial encounter: Secondary | ICD-10-CM | POA: Diagnosis not present

## 2017-06-02 DIAGNOSIS — S61313A Laceration without foreign body of left middle finger with damage to nail, initial encounter: Secondary | ICD-10-CM | POA: Diagnosis not present

## 2017-06-02 MED ORDER — AMOXICILLIN 500 MG PO TABS: 500.0000 mg | ORAL_TABLET | Freq: Two times a day (BID) | ORAL | 0 refills | Status: DC

## 2017-06-02 MED ORDER — LIDOCAINE-EPINEPHRINE-TETRACAINE (LET) SOLUTION
3.0000 mL | Freq: Once | NASAL | Status: AC
  Administered 2017-06-02: 3 mL via TOPICAL
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
## 2017-06-02 NOTE — ED Triage Notes (Signed)
Per patient slicing carrot at home when she cut /slice her left middle finger.

## 2017-06-06 ENCOUNTER — Encounter: Payer: Self-pay | Admitting: Adult Health

## 2017-06-06 ENCOUNTER — Ambulatory Visit: Payer: Self-pay | Admitting: Adult Health

## 2017-06-06 VITALS — BP 119/57 | HR 66 | Temp 98.1°F | Resp 16 | Ht 67.0 in | Wt 159.0 lb

## 2017-06-06 DIAGNOSIS — L089 Local infection of the skin and subcutaneous tissue, unspecified: Secondary | ICD-10-CM

## 2017-06-06 DIAGNOSIS — Z87828 Personal history of other (healed) physical injury and trauma: Secondary | ICD-10-CM

## 2017-06-06 MED ORDER — DOXYCYCLINE HYCLATE 100 MG PO TABS: 100.0000 mg | ORAL_TABLET | Freq: Two times a day (BID) | ORAL | 0 refills | Status: DC

## 2017-06-06 MED ORDER — FLUCONAZOLE 150 MG PO TABS: 150.0000 mg | ORAL_TABLET | Freq: Once | ORAL | 0 refills | Status: AC

## 2017-06-06 MED ORDER — SULFAMETHOXAZOLE-TRIMETHOPRIM 800-160 MG PO TABS: 1.0000 | ORAL_TABLET | Freq: Two times a day (BID) | ORAL | 0 refills | Status: DC

## 2017-06-06 NOTE — Progress Notes (Signed)
Subjective:     Patient ID: Caroline Mullen, female   DOB: May 07, 1970, 47 y.o.   MRN: [id number]  HPI  Blood pressure (!) 119/57, pulse 66, temperature 98.1 F (36.7 C), resp. rate 16, height  (1.702 m), weight 159 lb (72.1 kg), SpO2 100 %.  Patient is a  47 year old female in no acute distress who  comes to the clinic with complaint of left middle finger finger ts that she cut on a mandolin while slicing carro Sunday - went to [facility] 06/[facility]/17 Dermabond at urgent care.  She reports that on 06/05/17 she started having yellow/white drainage from laceration where some of the derma bond came off. She reports pushing on her finger and a large amount of drainage- white in color was expresses. Redness around laceration and sore to the touch she reports.  Denies any other trauma or re injury.  Denies numbness or tingling.   Patient  denies any fever, body aches,chills, rash, chest pain, shortness of breath, nausea, vomiting, or diarrhea.   Allergies  Allergen Reactions  . Morphine     REACTION: rash    Review of Systems  Constitutional: Negative.   HENT: Negative.   Respiratory: Negative.   Cardiovascular: Negative.   Gastrointestinal: Negative.   Genitourinary: Negative.   Musculoskeletal: Negative.   Skin: Positive for color change and wound. Negative for pallor and rash.  Neurological: Negative.   Hematological: Negative.   Psychiatric/Behavioral: Negative.        Objective:   Physical Exam  Constitutional: She appears well-developed and well-nourished. No distress.  Patient is alert and oriented and responsive to questions Engages in eye contact with provider. Speaks in full sentences without any pauses without any shortness of breath.   Patient moves on and off of exam table and in room without difficulty. Gait is normal in hall and in room. Patient is oriented to person place time and situation. Patient answers questions appropriately and engages in conversation.     HENT:  Head: Normocephalic and atraumatic.  Eyes: Pupils are equal, round, and reactive to light. Conjunctivae are normal.  Neck: Normal range of motion. Neck supple.  Cardiovascular: Normal rate, regular rhythm, normal heart sounds and intact distal pulses. Exam reveals no gallop and no friction rub.  No murmur heard. Pulmonary/Chest: Effort normal and breath sounds normal. No stridor. No respiratory distress. She has no wheezes. She has no rales. She exhibits no tenderness.  Abdominal: Soft.  Musculoskeletal: Normal range of motion.       Left hand: She exhibits tenderness (left third digut tip at site of laceration). She exhibits normal range of motion, no bony tenderness, normal two-point discrimination, normal capillary refill, no deformity, no laceration and no swelling. Normal sensation noted. Normal strength noted.       Hands: Skin: Skin is warm and dry. Capillary refill takes less than 2 seconds. No rash noted. She is not diaphoretic. There is erythema. No pallor.     Derma bonded 1 cm laceration well approximated with very minimal small area of erythema surrounding distal portion of wound. No discharge noted. Tender skin tissue at wound site.  Nail bed is in tact. Her finger nails were very long at time of injury she reports - though she trimeed this nail off since Derma bond and reports no nail injury now. No nailbed tenderness.   Psychiatric: She has a normal mood and affect. Her speech is normal and behavior is normal. Judgment and thought content  normal. Cognition and memory are normal.       Assessment:     Finger infection  History of laceration of skin      Plan:     Will start Bactrim as prescribed below to have MRSA coverage and cover skin infection.  Meds ordered this encounter  Medications  . sulfamethoxazole-trimethoprim (BACTRIM DS,SEPTRA DS) 800-160 MG tablet    Sig: Take 1 tablet by mouth 2 (two) times daily.    Dispense:  20 tablet    Refill:  0  .  fluconazole (DIFLUCAN) 150 MG tablet    Sig: Take 1 tablet (150 mg total) by mouth once for 1 dose. If needed for yeast infection    Dispense:  1 tablet    Refill:  0  Discontinued Amoxicillin.     Advised patient call the office or your primary care doctor for an appointment if no improvement within 72 hours or if any symptoms change or worsen at any time  Advised ER or urgent Care if after hours or on weekend. Call 911 for emergency symptoms at any time.Patinet verbalized understanding of all instructions given/reviewed and treatment plan and has no further questions or concerns at this time.    Patient verbalized understanding of all instructions given and denies any further questions at this time.

## 2017-06-06 NOTE — Patient Instructions (Signed)

## 2017-07-09 ENCOUNTER — Ambulatory Visit: Payer: BLUE CROSS/BLUE SHIELD | Admitting: Gastroenterology

## 2017-07-26 ENCOUNTER — Ambulatory Visit: Payer: Self-pay | Admitting: Adult Health

## 2017-07-26 ENCOUNTER — Encounter: Payer: Self-pay | Admitting: Adult Health

## 2017-07-26 VITALS — BP 133/68 | HR 64 | Temp 98.2°F | Resp 16 | Ht 67.0 in | Wt 156.0 lb

## 2017-07-26 DIAGNOSIS — H6501 Acute serous otitis media, right ear: Secondary | ICD-10-CM

## 2017-07-26 DIAGNOSIS — M359 Systemic involvement of connective tissue, unspecified: Secondary | ICD-10-CM

## 2017-07-26 DIAGNOSIS — J029 Acute pharyngitis, unspecified: Secondary | ICD-10-CM

## 2017-07-26 DIAGNOSIS — H60501 Unspecified acute noninfective otitis externa, right ear: Secondary | ICD-10-CM

## 2017-07-26 MED ORDER — PREDNISONE 10 MG (21) PO TBPK: ORAL_TABLET | ORAL | 0 refills | Status: DC

## 2017-07-26 MED ORDER — AMOXICILLIN 875 MG PO TABS: 875.0000 mg | ORAL_TABLET | Freq: Two times a day (BID) | ORAL | 0 refills | Status: DC

## 2017-07-26 MED ORDER — FLUCONAZOLE 150 MG PO TABS: 150.0000 mg | ORAL_TABLET | Freq: Once | ORAL | 0 refills | Status: AC

## 2017-07-26 MED ORDER — NEOMYCIN-POLYMYXIN-HC 3.5-10000-1 OT SOLN: 3.0000 [drp] | Freq: Four times a day (QID) | OTIC | 0 refills | Status: DC

## 2017-07-26 NOTE — Patient Instructions (Signed)
Prednisone delayed-release tablets What is this medicine? PREDNISONE (PRED ni sone) is a corticosteroid. It is commonly used to treat inflammation of the skin, joints, lungs, and other organs. Common conditions treated include asthma, allergies, and arthritis. It is also used for other conditions, such as blood disorders and diseases of the adrenal glands. This medicine may be used for other purposes; ask your health care provider or pharmacist if you have questions. COMMON BRAND NAME(S): RAYOS What should I tell my health care provider before I take this medicine? They need to know if you have any of these conditions: -Cushing's syndrome -diabetes -glaucoma -heart disease -high blood pressure -infection (especially a virus infection such as chickenpox, cold sores, or herpes) -kidney disease -liver disease -mental illness -myasthenia gravis -osteoporosis -seizures -stomach or intestine problems -thyroid disease -an unusual or allergic reaction to lactose, prednisone, other medicines, foods, dyes, or preservatives -pregnant or trying to get pregnant -breast-feeding How should I use this medicine? Take this medicine by mouth with a glass of water. Follow the directions on the prescription label. Take this medicine with food. Do not cut, crush or chew this medicine. Do not suddenly stop taking your medicine because you may develop a severe reaction. If your doctor wants you to stop the medicine, the dose may be slowly lowered over time to avoid any side effects. Talk to your pediatrician regarding the use of this medicine in children. Special care may be needed. Overdosage: If you think you have taken too much of this medicine contact a poison control center or emergency room at once. NOTE: This medicine is only for you. Do not share this medicine with others. What if I miss a dose? If you miss a dose, take it as soon as you can. If it is almost time for your next dose, talk to your doctor  or health care professional. You may need to miss a dose or take an extra dose. Do not take double or extra doses without advice. What may interact with this medicine? Do not take this medicine with any of the following medications: -metyrapone -mifepristone This medicine may also interact with the following medications: -aminoglutethimide -amphotericin B -aspirin and aspirin-like medicines -barbiturates -certain medicines for diabetes, like glipizide or glyburide -cholestyramine -cholinesterase inhibitors -cyclosporine -digoxin -diuretics -ephedrine -female hormones, like estrogens and birth control pills -isoniazid -ketoconazole -NSAIDS, medicines for pain and inflammation, like ibuprofen or naproxen -phenytoin -rifampin -toxoids -vaccines -warfarin This list may not describe all possible interactions. Give your health care provider a list of all the medicines, herbs, non-prescription drugs, or dietary supplements you use. Also tell them if you smoke, drink alcohol, or use illegal drugs. Some items may interact with your medicine. What should I watch for while using this medicine? Visit your doctor or health care professional for regular checks on your progress. If you are taking this medicine over a prolonged period, carry an identification card with your name and address, the type and dose of your medicine, and your doctor's name and address. This medicine may increase your risk of getting an infection. Tell your doctor or health care professional if you are around anyone with measles or chickenpox, or if you develop sores or blisters that do not heal properly. If you are going to have surgery, tell your doctor or health care professional that you have taken this medicine within the last twelve months. Ask your doctor or health care professional about your diet. You may need to lower the amount of salt you  eat. This medicine may affect blood sugar levels. If you have diabetes,  check with your doctor or health care professional before you change your diet or the dose of your diabetic medicine. What side effects may I notice from receiving this medicine? Side effects that you should report to your doctor or health care professional as soon as possible: -allergic reactions like skin rash, itching or hives, swelling of the face, lips, or tongue -changes in emotions or moods -changes in vision -depressed mood -eye pain -fever or chills, cough, sore throat, pain or difficulty passing urine -increased thirst -swelling of ankles, feet Side effects that usually do not require medical attention (report to your doctor or health care professional if they continue or are bothersome): -confusion, excitement, restlessness -headache -nausea, vomiting -skin problems, acne, thin and shiny skin -trouble sleeping -weight gain This list may not describe all possible side effects. Call your doctor for medical advice about side effects. You may report side effects to FDA at 1-800-FDA-1088. Where should I keep my medicine? Keep out of the reach of children. Store at room temperature between 15 and 30 degrees C (59 and 86 degrees F). Protect from light and moisture. Keep container tightly closed. Throw away any unused medicine after the expiration date. NOTE: This sheet is a summary. It may not cover all possible information. If you have questions about this medicine, talk to your doctor, pharmacist, or health care provider.  2018 Elsevier/Gold Standard (2015-02-17 13:41:35) Otitis Media, Adult Otitis media is redness, soreness, and puffiness (swelling) in the space just behind your eardrum (middle ear). It may be caused by allergies or infection. It often happens along with a cold. Follow these instructions at home:  Take your medicine as told. Finish it even if you start to feel better.  Only take over-the-counter or prescription medicines for pain, discomfort, or fever as told by  your doctor.  Follow up with your doctor as told. Contact a doctor if:  You have otitis media only in one ear, or bleeding from your nose, or both.  You notice a lump on your neck.  You are not getting better in 3-5 days.  You feel worse instead of better. Get help right away if:  You have pain that is not helped with medicine.  You have puffiness, redness, or pain around your ear.  You get a stiff neck.  You cannot move part of your face (paralysis).  You notice that the bone behind your ear hurts when you touch it. This information is not intended to replace advice given to you by your health care provider. Make sure you discuss any questions you have with your health care provider. Document Released: 07/04/2007 Document Revised: 06/23/2015 Document Reviewed: 08/12/2012 Elsevier Interactive Patient Education  2017 [employer]

## 2017-07-26 NOTE — Progress Notes (Signed)
Subjective:     Patient ID: Caroline Mullen, female   DOB: February 18, 1970, 47 y.o.   MRN: [id number]  HPI  Blood pressure 133/68, pulse 64, temperature 98.2 F (36.8 C), resp. rate 16, height 5\' 7"  (1.702 m), weight 156 lb (70.8 kg), SpO2 100 %.  Patient is a 47 year old female in no acute distress who comes to the clinic in  No acute distress she started with sore throat and right ear pain, fatigue since last Friday 07/19/17. Neck hurts more right sided than left. Felt some better with husband massaging neck.   She also has a new mouth guard for TMJ and is not sure if this is making her neck/ jaw feel more sore.   Patient  denies any fever,chills, rash, chest pain, shortness of breath, nausea, vomiting, or diarrhea.   Requests [facility] [place] [place]  [place].  " undifferentiated connective tissue disorder" She was going to [facility] previously but requests 2nd opinion referral for work up with the above listed MD. She is currently undergoing no current treatment.    She was placed on Amoxicillin for a laceration on finger and this was discontinued and she was placed on Bactrim. She reports this resolved since visit on 06/06/17.  Denies any liver or kidney disease and sees primary care.   She requests Diflucan x 1 for yeast if develops with antibiotic use.  Review of Systems  Constitutional: Positive for fatigue. Negative for activity change, appetite change, chills, diaphoresis, fever and unexpected weight change.  HENT: Positive for ear pain and sore throat. Negative for congestion, dental problem, drooling, ear discharge, facial swelling, hearing loss, mouth sores, nosebleeds, postnasal drip, rhinorrhea, sinus pressure, sinus pain, sneezing, tinnitus, trouble swallowing and voice change.   Eyes: Negative.   Respiratory: Negative.   Cardiovascular: Negative.   Gastrointestinal: Negative.   Endocrine: Negative.   Genitourinary: Negative.   Musculoskeletal: Positive for arthralgias (hands  and feet for months " moves around" . ) and neck stiffness. Negative for back pain, gait problem, joint swelling, myalgias and neck pain.  Skin: Negative.   Allergic/Immunologic: Negative.   Neurological: Negative.   Hematological: Negative.   Psychiatric/Behavioral: Negative.        Objective:   Physical Exam  Constitutional: She is oriented to person, place, and time. She appears well-developed and well-nourished. No distress. She is not intubated.  HENT:  Head: Normocephalic and atraumatic.  Right Ear: Hearing normal. There is swelling and tenderness. No drainage. A middle ear effusion is present.  Left Ear: Hearing and ear canal normal. No drainage, swelling or tenderness. A middle ear effusion is present.  Nose: Nose normal. No mucosal edema or rhinorrhea. Right sinus exhibits no maxillary sinus tenderness and no frontal sinus tenderness. Left sinus exhibits no maxillary sinus tenderness and no frontal sinus tenderness.  Mouth/Throat: Uvula is midline and mucous membranes are normal. No uvula swelling. Posterior oropharyngeal erythema present. No oropharyngeal exudate, posterior oropharyngeal edema or tonsillar abscesses. Tonsils are 0 on the right. Tonsils are 0 on the left.  Eyes: Pupils are equal, round, and reactive to light. Conjunctivae and EOM are normal. Right eye exhibits no discharge. Left eye exhibits no discharge. No scleral icterus.  Neck: Trachea normal, normal range of motion, full passive range of motion without pain and phonation normal. Neck supple. Normal carotid pulses, no hepatojugular reflux and no JVD present. No tracheal tenderness, no spinous process tenderness and no muscular tenderness present. Carotid bruit is not present. No neck  rigidity. No tracheal deviation, no edema, no erythema and normal range of motion present. No Brudzinski's sign noted. No thyroid mass and no thyromegaly present.  Cardiovascular: Normal rate, regular rhythm, normal heart sounds, intact  distal pulses and normal pulses. Exam reveals no gallop and no friction rub.  No murmur heard. Pulmonary/Chest: Effort normal and breath sounds normal. No accessory muscle usage or stridor. No apnea, no tachypnea and no bradypnea. She is not intubated. No respiratory distress. She has no wheezes. She has no rhonchi. She has no rales. She exhibits no tenderness.  Abdominal: Soft. Bowel sounds are normal.  Musculoskeletal: Normal range of motion.  Lymphadenopathy:       Head (right side): Posterior auricular (mild ) adenopathy present. No submental, no submandibular, no tonsillar, no preauricular and no occipital adenopathy present.       Head (left side): No submental, no submandibular, no tonsillar, no preauricular, no posterior auricular and no occipital adenopathy present.    She has no cervical adenopathy.  Neurological: She is alert and oriented to person, place, and time. She has normal strength. She displays normal reflexes. No cranial nerve deficit. She exhibits normal muscle tone. Coordination normal.  Skin: Skin is warm, dry and intact. Capillary refill takes less than 2 seconds. No rash noted. She is not diaphoretic. No erythema. No pallor. Nails show no clubbing.  Psychiatric: She has a normal mood and affect. Her speech is normal and behavior is normal. Judgment and thought content normal. Cognition and memory are normal.  Vitals reviewed.      Assessment:    Non-recurrent acute serous otitis media of right ear  Acute otitis externa of right ear, unspecified type  Pharyngitis, unspecified etiology  Diffuse connective tissue disease (HCC)       Plan:     Meds ordered this encounter  Medications  . neomycin-polymyxin-hydrocortisone (CORTISPORIN) OTIC solution    Sig: Place 3 drops into the right ear 4 (four) times daily.    Dispense:  10 mL    Refill:  0  . predniSONE (STERAPRED UNI-PAK 21 TAB) 10 MG (21) TBPK tablet    Sig: PO: Take 6 tablets on day 1:Take 5 tablets  day 2:Take 4 tablets day 3: Take 3 tablets day 4:Take 2 tablets day five: 5 Take 1 tablet day 6    Dispense:  21 tablet    Refill:  0  . amoxicillin (AMOXIL) 875 MG tablet    Sig: Take 1 tablet (875 mg total) by mouth 2 (two) times daily.    Dispense:  20 tablet    Refill:  0  . fluconazole (DIFLUCAN) 150 MG tablet    Sig: Take 1 tablet (150 mg total) by mouth once for 1 dose. FOR YEAST ONLY IF DEVELOPS WITH ANTIBIOTIC USE    Dispense:  1 tablet    Refill:  0   Recommend yearly physical and labs with primary care and also as recommended by primary care for follow up.  Will place referral to Rheumatology provider of choice( advised her to check on her insurance as to if this provider is out of network)  as above per patient request given history.Refferal form requested from [facility] and Rheumatology [phone] and will have nursing Armando Gang RN - Fax over refferal form as provider Allena Katz, Mayur not found in Epic. Unable to place referral in Epic- manual referral.     Advised patient call the office or your primary care doctor for an appointment if no improvement within  72 hours or if any symptoms change or worsen at any time  Advised ER or urgent Care if after hours or on weekend. Call 911 for emergency symptoms at any time.Patinet verbalized understanding of all instructions given/reviewed and treatment plan and has no further questions or concerns at this time.    Patient verbalized understanding of all instructions given and denies any further questions at this time.

## 2017-08-14 ENCOUNTER — Other Ambulatory Visit: Payer: Self-pay

## 2017-08-14 DIAGNOSIS — E890 Postprocedural hypothyroidism: Secondary | ICD-10-CM

## 2017-08-14 DIAGNOSIS — E039 Hypothyroidism, unspecified: Secondary | ICD-10-CM

## 2017-08-14 NOTE — Addendum Note (Signed)
Addended by: Sharin GraveOZART, Karlea Mckibbin M on: 08/14/2017 10:28 AM   Modules accepted: Orders

## 2017-08-15 LAB — TSH: TSH: 0.514 u[IU]/mL (ref 0.450–4.500)

## 2017-08-15 LAB — T3, FREE: T3, Free: 2.3 pg/mL (ref 2.0–4.4)

## 2017-08-15 LAB — T4, FREE: Free T4: 2 ng/dL — ABNORMAL HIGH (ref 0.82–1.77)

## 2017-08-21 LAB — SPECIMEN STATUS REPORT

## 2017-08-21 LAB — GLUCOSE, RANDOM: Glucose: 91 mg/dL (ref 65–99)

## 2017-09-02 DIAGNOSIS — M530 Cervicocranial syndrome: Secondary | ICD-10-CM | POA: Diagnosis not present

## 2017-09-02 DIAGNOSIS — G44219 Episodic tension-type headache, not intractable: Secondary | ICD-10-CM | POA: Diagnosis not present

## 2017-09-02 DIAGNOSIS — M5387 Other specified dorsopathies, lumbosacral region: Secondary | ICD-10-CM | POA: Diagnosis not present

## 2017-09-02 DIAGNOSIS — S8392XA Sprain of unspecified site of left knee, initial encounter: Secondary | ICD-10-CM | POA: Diagnosis not present

## 2017-09-24 ENCOUNTER — Other Ambulatory Visit: Payer: Self-pay | Admitting: Obstetrics and Gynecology

## 2017-09-24 ENCOUNTER — Other Ambulatory Visit: Payer: Self-pay

## 2017-09-24 DIAGNOSIS — E039 Hypothyroidism, unspecified: Secondary | ICD-10-CM

## 2017-09-24 DIAGNOSIS — Z1231 Encounter for screening mammogram for malignant neoplasm of breast: Secondary | ICD-10-CM

## 2017-09-25 LAB — T3, FREE: T3, Free: 2.5 pg/mL (ref 2.0–4.4)

## 2017-09-25 LAB — T4, FREE: Free T4: 1.9 ng/dL — ABNORMAL HIGH (ref 0.82–1.77)

## 2017-09-25 LAB — TSH: TSH: 1.08 u[IU]/mL (ref 0.450–4.500)

## 2017-09-25 LAB — GLUCOSE, RANDOM: Glucose: 107 mg/dL — ABNORMAL HIGH (ref 65–99)

## 2017-10-07 DIAGNOSIS — T22011A Burn of unspecified degree of right forearm, initial encounter: Secondary | ICD-10-CM | POA: Diagnosis not present

## 2017-10-07 DIAGNOSIS — L820 Inflamed seborrheic keratosis: Secondary | ICD-10-CM | POA: Diagnosis not present

## 2017-10-15 ENCOUNTER — Ambulatory Visit
Admission: RE | Admit: 2017-10-15 | Discharge: 2017-10-15 | Disposition: A | Discharge status: Home / Self Care | Payer: BLUE CROSS/BLUE SHIELD | Source: Ambulatory Visit | Attending: Obstetrics and Gynecology | Admitting: Obstetrics and Gynecology

## 2017-10-15 DIAGNOSIS — Z1231 Encounter for screening mammogram for malignant neoplasm of breast: Secondary | ICD-10-CM | POA: Insufficient documentation

## 2017-10-23 ENCOUNTER — Ambulatory Visit (INDEPENDENT_AMBULATORY_CARE_PROVIDER_SITE_OTHER): Payer: BLUE CROSS/BLUE SHIELD | Admitting: Obstetrics and Gynecology

## 2017-10-23 ENCOUNTER — Encounter: Payer: Self-pay | Admitting: Obstetrics and Gynecology

## 2017-10-23 ENCOUNTER — Other Ambulatory Visit (HOSPITAL_COMMUNITY)
Admission: RE | Admit: 2017-10-23 | Discharge: 2017-10-23 | Disposition: A | Discharge status: Home / Self Care | Payer: BLUE CROSS/BLUE SHIELD | Source: Ambulatory Visit | Attending: Obstetrics and Gynecology | Admitting: Obstetrics and Gynecology

## 2017-10-23 VITALS — BP 114/67 | HR 67 | Ht 67.0 in | Wt 146.2 lb

## 2017-10-23 DIAGNOSIS — Z01419 Encounter for gynecological examination (general) (routine) without abnormal findings: Secondary | ICD-10-CM | POA: Diagnosis not present

## 2017-10-23 DIAGNOSIS — Z13220 Encounter for screening for lipoid disorders: Secondary | ICD-10-CM | POA: Diagnosis not present

## 2017-10-23 NOTE — Patient Instructions (Signed)
Place annual gynecologic exam patient instructions here.

## 2017-10-23 NOTE — Progress Notes (Signed)
Subjective:   WINTER JOCELYN is a 47 y.o. G16P0 Caucasian female here for a routine well-woman exam.  No LMP recorded. Patient has had a hysterectomy.    Current complaints: none PCP: me       does desire labs  Social History: Sexual: heterosexual Marital Status: married Living situation: with spouse Occupation: unknown occupation Tobacco/alcohol: no tobacco use Illicit drugs: no history of illicit drug use  The following portions of the patient's history were reviewed and updated as appropriate: allergies, current medications, past family history, past medical history, past social history, past surgical history and problem list.  Past Medical History Past Medical History:  Diagnosis Date  . Anxiety   . Connective tissue disease (HCC)    on Plaquenil    NO LONGER ON MEDS NO PROBLEMS X 6 YEARS  . Hyperthyroidism   . Migraine     Past Surgical History Past Surgical History:  Procedure Laterality Date  . ABDOMINAL HYSTERECTOMY     fibroid uterine tumors supracervical  . CESAREAN SECTION    . KNEE ARTHROSCOPY    . SHOULDER SURGERY  1998  . THYROIDECTOMY N/A 08/08/2015   Procedure: THYROIDECTOMY;  Surgeon: Vernie Murders, MD;  Location: [facility] ORS;  Service: ENT;  Laterality: N/A;  . TUBAL LIGATION      Gynecologic History G2P0  No LMP recorded. Patient has had a hysterectomy. Contraception: status post hysterectomy Last Pap: 2016. Results were: normal Last mammogram: 09/2017. Results were: normal   Obstetric History OB History  Gravida Para Term Preterm AB Living  2            SAB TAB Ectopic Multiple Live Births               # Outcome Date GA Lbr Len/2nd Weight Sex Delivery Anes PTL Lv  2 Gravida 2002    F CS-Unspec     1 Gravida             Current Medications Current Outpatient Medications on File Prior to Visit  Medication Sig Dispense Refill  . levothyroxine (SYNTHROID, LEVOTHROID) 125 MCG tablet Take 125 mcg by mouth daily before breakfast.    . Multiple  Vitamin (MULTIVITAMIN WITH MINERALS) TABS tablet Take 1 tablet by mouth daily.    . Multiple Vitamin (MULTIVITAMIN) capsule Take by mouth.    . ALPRAZolam (XANAX) 0.5 MG tablet Take 1 tablet (0.5 mg total) by mouth 3 (three) times daily as needed. for anxiety (Patient not taking: Reported on 06/06/2017) 45 tablet 1  . cetirizine (ZYRTEC) 10 MG tablet Take 10 mg by mouth.    . fluticasone (FLONASE) 50 MCG/ACT nasal spray Place 1 spray into both nostrils 2 (two) times daily. 16 g 0  . ibuprofen (ADVIL,MOTRIN) 200 MG tablet Take 200 mg by mouth every 6 (six) hours as needed for moderate pain.     Marland Kitchen neomycin-polymyxin-hydrocortisone (CORTISPORIN) OTIC solution Place 3 drops into the right ear 4 (four) times daily. (Patient not taking: Reported on 10/23/2017) 10 mL 0  . predniSONE (STERAPRED UNI-PAK 21 TAB) 10 MG (21) TBPK tablet PO: Take 6 tablets on day 1:Take 5 tablets day 2:Take 4 tablets day 3: Take 3 tablets day 4:Take 2 tablets day five: 5 Take 1 tablet day 6 (Patient not taking: Reported on 10/23/2017) 21 tablet 0  . sulfamethoxazole-trimethoprim (BACTRIM DS,SEPTRA DS) 800-160 MG tablet Take 1 tablet by mouth 2 (two) times daily. (Patient not taking: Reported on 07/26/2017) 20 tablet 0  . SUMAtriptan (IMITREX) 25  MG tablet Take 25 mg by mouth.     No current facility-administered medications on file prior to visit.     Review of Systems Patient denies any headaches, blurred vision, shortness of breath, chest pain, abdominal pain, problems with bowel movements, urination, or intercourse.  Objective:  BP 114/67   Pulse 67   Ht 5\' 7"  (1.702 m)   Wt 146 lb 3.2 oz (66.3 kg)   BMI 22.90 kg/m  Physical Exam  General:  Well developed, well nourished, no acute distress. She is alert and oriented x3. Skin:  Warm and dry Neck:  Midline trachea, no thyromegaly or nodules Cardiovascular: Regular rate and rhythm, no murmur heard Lungs:  Effort normal, all lung fields clear to auscultation  bilaterally Breasts:  No dominant palpable mass, retraction, or nipple discharge Abdomen:  Soft, non tender, no hepatosplenomegaly or masses Pelvic:  External genitalia is normal in appearance.  The vagina is normal in appearance. The cervix is bulbous, no CMT.  Thin prep pap is not done. Uterus is surgically absent  No adnexal masses or tenderness noted. Extremities:  No swelling or varicosities noted Psych:  She has a normal mood and affect  Assessment:   Healthy well-woman exam  Plan:   F/U 1 year for AE, or sooner if needed   Aiva Miskell Suzan Nailer, CNM

## 2017-10-23 NOTE — Addendum Note (Signed)
Addended by: Rosine BeatLONTZ, AMY L on: 10/23/2017 12:56 PM   Modules accepted: Orders

## 2017-10-24 LAB — COMPREHENSIVE METABOLIC PANEL
ALT: 12 IU/L (ref 0–32)
AST: 11 IU/L (ref 0–40)
Albumin/Globulin Ratio: 1.9 (ref 1.2–2.2)
Albumin: 4.1 g/dL (ref 3.5–5.5)
Alkaline Phosphatase: 45 IU/L (ref 39–117)
BUN/Creatinine Ratio: 16 (ref 9–23)
BUN: 14 mg/dL (ref 6–24)
Bilirubin Total: 0.8 mg/dL (ref 0.0–1.2)
CO2: 22 mmol/L (ref 20–29)
Calcium: 9.2 mg/dL (ref 8.7–10.2)
Chloride: 101 mmol/L (ref 96–106)
Creatinine, Ser: 0.85 mg/dL (ref 0.57–1.00)
GFR calc Af Amer: 94 mL/min/{1.73_m2} (ref >59)
GFR calc non Af Amer: 81 mL/min/{1.73_m2} (ref >59)
Globulin, Total: 2.2 g/dL (ref 1.5–4.5)
Glucose: 103 mg/dL — ABNORMAL HIGH (ref 65–99)
Potassium: 4.2 mmol/L (ref 3.5–5.2)
Sodium: 139 mmol/L (ref 134–144)
Total Protein: 6.3 g/dL (ref 6.0–8.5)

## 2017-10-24 LAB — LIPID PANEL
Chol/HDL Ratio: 6.4 ratio — ABNORMAL HIGH (ref 0.0–4.4)
Cholesterol, Total: 231 mg/dL — ABNORMAL HIGH (ref 100–199)
HDL: 36 mg/dL — ABNORMAL LOW (ref >39)
LDL Calculated: 165 mg/dL — ABNORMAL HIGH (ref 0–99)
Triglycerides: 151 mg/dL — ABNORMAL HIGH (ref 0–149)
VLDL Cholesterol Cal: 30 mg/dL (ref 5–40)

## 2017-10-24 LAB — HEMOGLOBIN A1C
Est. average glucose Bld gHb Est-mCnc: 111 mg/dL
Hgb A1c MFr Bld: 5.5 % (ref 4.8–5.6)

## 2017-11-01 ENCOUNTER — Telehealth: Payer: Self-pay | Admitting: Obstetrics and Gynecology

## 2017-11-01 NOTE — Telephone Encounter (Signed)
The patient is resting a call back from Amy in regards to some lab work in her chart. No other information was disclosed. Please advise.

## 2017-11-01 NOTE — Telephone Encounter (Signed)
pls advise

## 2017-11-06 LAB — CYTOLOGY - PAP

## 2017-11-12 ENCOUNTER — Telehealth: Payer: Self-pay | Admitting: Obstetrics and Gynecology

## 2017-11-12 NOTE — Telephone Encounter (Signed)
The patient called requesting a call from Amy. Please advise.

## 2017-11-12 NOTE — Telephone Encounter (Signed)
Gave to [employer]

## 2017-12-18 DIAGNOSIS — M9902 Segmental and somatic dysfunction of thoracic region: Secondary | ICD-10-CM | POA: Diagnosis not present

## 2017-12-18 DIAGNOSIS — G44219 Episodic tension-type headache, not intractable: Secondary | ICD-10-CM | POA: Diagnosis not present

## 2017-12-18 DIAGNOSIS — M530 Cervicocranial syndrome: Secondary | ICD-10-CM | POA: Diagnosis not present

## 2017-12-18 DIAGNOSIS — M531 Cervicobrachial syndrome: Secondary | ICD-10-CM | POA: Diagnosis not present

## 2017-12-31 ENCOUNTER — Other Ambulatory Visit: Payer: Self-pay

## 2017-12-31 DIAGNOSIS — E039 Hypothyroidism, unspecified: Secondary | ICD-10-CM

## 2018-01-01 ENCOUNTER — Other Ambulatory Visit: Payer: Self-pay

## 2018-01-01 DIAGNOSIS — E039 Hypothyroidism, unspecified: Secondary | ICD-10-CM

## 2018-01-02 LAB — T4, FREE: Free T4: 1.79 ng/dL — ABNORMAL HIGH (ref 0.82–1.77)

## 2018-01-02 LAB — T3, FREE: T3, Free: 2.5 pg/mL (ref 2.0–4.4)

## 2018-01-02 LAB — TSH: TSH: 1.89 u[IU]/mL (ref 0.450–4.500)

## 2018-01-07 DIAGNOSIS — E0500 Thyrotoxicosis with diffuse goiter without thyrotoxic crisis or storm: Secondary | ICD-10-CM | POA: Diagnosis not present

## 2018-01-07 DIAGNOSIS — E890 Postprocedural hypothyroidism: Secondary | ICD-10-CM | POA: Diagnosis not present

## 2018-01-07 DIAGNOSIS — R232 Flushing: Secondary | ICD-10-CM | POA: Diagnosis not present

## 2018-01-07 DIAGNOSIS — L659 Nonscarring hair loss, unspecified: Secondary | ICD-10-CM | POA: Diagnosis not present

## 2018-01-07 DIAGNOSIS — R768 Other specified abnormal immunological findings in serum: Secondary | ICD-10-CM | POA: Diagnosis not present

## 2018-01-08 DIAGNOSIS — M9903 Segmental and somatic dysfunction of lumbar region: Secondary | ICD-10-CM | POA: Diagnosis not present

## 2018-01-08 DIAGNOSIS — M5387 Other specified dorsopathies, lumbosacral region: Secondary | ICD-10-CM | POA: Diagnosis not present

## 2018-01-10 DIAGNOSIS — M9901 Segmental and somatic dysfunction of cervical region: Secondary | ICD-10-CM | POA: Diagnosis not present

## 2018-01-10 DIAGNOSIS — M5387 Other specified dorsopathies, lumbosacral region: Secondary | ICD-10-CM | POA: Diagnosis not present

## 2018-01-10 DIAGNOSIS — M9902 Segmental and somatic dysfunction of thoracic region: Secondary | ICD-10-CM | POA: Diagnosis not present

## 2018-01-10 DIAGNOSIS — M9903 Segmental and somatic dysfunction of lumbar region: Secondary | ICD-10-CM | POA: Diagnosis not present

## 2018-02-03 DIAGNOSIS — S43401A Unspecified sprain of right shoulder joint, initial encounter: Secondary | ICD-10-CM | POA: Diagnosis not present

## 2018-02-03 DIAGNOSIS — M531 Cervicobrachial syndrome: Secondary | ICD-10-CM | POA: Diagnosis not present

## 2018-02-03 DIAGNOSIS — M9901 Segmental and somatic dysfunction of cervical region: Secondary | ICD-10-CM | POA: Diagnosis not present

## 2018-02-03 DIAGNOSIS — M9903 Segmental and somatic dysfunction of lumbar region: Secondary | ICD-10-CM | POA: Diagnosis not present

## 2018-02-05 DIAGNOSIS — M9901 Segmental and somatic dysfunction of cervical region: Secondary | ICD-10-CM | POA: Diagnosis not present

## 2018-02-05 DIAGNOSIS — M9903 Segmental and somatic dysfunction of lumbar region: Secondary | ICD-10-CM | POA: Diagnosis not present

## 2018-02-05 DIAGNOSIS — S43401A Unspecified sprain of right shoulder joint, initial encounter: Secondary | ICD-10-CM | POA: Diagnosis not present

## 2018-02-05 DIAGNOSIS — M531 Cervicobrachial syndrome: Secondary | ICD-10-CM | POA: Diagnosis not present

## 2018-02-07 DIAGNOSIS — M531 Cervicobrachial syndrome: Secondary | ICD-10-CM | POA: Diagnosis not present

## 2018-02-07 DIAGNOSIS — M9903 Segmental and somatic dysfunction of lumbar region: Secondary | ICD-10-CM | POA: Diagnosis not present

## 2018-02-07 DIAGNOSIS — M9901 Segmental and somatic dysfunction of cervical region: Secondary | ICD-10-CM | POA: Diagnosis not present

## 2018-02-07 DIAGNOSIS — S43401A Unspecified sprain of right shoulder joint, initial encounter: Secondary | ICD-10-CM | POA: Diagnosis not present

## 2018-02-10 DIAGNOSIS — E039 Hypothyroidism, unspecified: Secondary | ICD-10-CM | POA: Diagnosis not present

## 2018-02-11 ENCOUNTER — Ambulatory Visit: Payer: Self-pay | Admitting: Medical

## 2018-02-11 ENCOUNTER — Encounter: Payer: Self-pay | Admitting: Medical

## 2018-02-11 VITALS — BP 131/70 | HR 83 | Temp 98.6°F | Resp 16 | Wt 156.6 lb

## 2018-02-11 DIAGNOSIS — T148XXA Other injury of unspecified body region, initial encounter: Secondary | ICD-10-CM

## 2018-02-11 DIAGNOSIS — M25511 Pain in right shoulder: Secondary | ICD-10-CM

## 2018-02-11 DIAGNOSIS — M25562 Pain in left knee: Secondary | ICD-10-CM

## 2018-02-11 MED ORDER — CYCLOBENZAPRINE HCL 5 MG PO TABS: 5.0000 mg | ORAL_TABLET | Freq: Three times a day (TID) | ORAL | 0 refills | Status: DC | PRN

## 2018-02-11 MED ORDER — ETODOLAC 500 MG PO TABS: 500.0000 mg | ORAL_TABLET | Freq: Two times a day (BID) | ORAL | 0 refills | Status: DC

## 2018-02-11 NOTE — Progress Notes (Signed)
Subjective:    Patient ID: Caroline Mullen, female    DOB: 08/26/1970, 48 y.o.   MRN: [id number]  HPI 48 yo female in non acute distess. Comes to clinic today with complaints of right shoulder pain and left knee pain.  Fell on Sunday 02/02/2018, slipped on step due to door stop, denies hitting hed or loss of consciousness. Fell down a total of 7 steps.  Seen by Chiropractor Dr. Alfredo Bach 3 times last week, did not seem to helped. Had subluxation of back and neck, and he would push on right shoulder. Denies any bruising or abrasions.  Left knee was aching and stood all day on Saturday was attending a baby shower, that is when the  swelling occurred in the knee, has ice and elevated and take Ibuprofen 600mg  aevery 6 hours.  Pain is located the back of the knee. Had trouble walking yesterday due to swelling.  Most of the swelling is resolved  now and she has had no trouble walking today and the knee feels better.Denies any numbness or tingling. Has FROM. Knee only hurts when with squatting.  Right shoulder painful at the the shoulder point and under right arm movement at a 90 degree angle.. Tender to the touch.  Sharp pain radiating downward, stops at elbow, no elbow pain. No numbness or tingling into arm. Painful to raise are to the side and put are behind her back.  No back or neck pain , no shooting pain down right or left arm with rotation of neck.FROM of her neck. No prior injury of the right shoulder, no prior injury of the left knee.  Appointment with Lucendia Herrlich Dr. Charlett Blake on 02/19/2018 at 9:30 am . Prior history of left rotator cuff injury requiring surgery no pain in left shoulder today.  Patient would like another anti-inflammatory medication. Feels Ibuprofen is not working the best for her. Allergies  Allergen Reactions  . Morphine     REACTION: rash    Review of Systems  Allergic/Immunologic: Positive for environmental allergies. Negative for food allergies.  Hematological:  Negative for adenopathy.  Psychiatric/Behavioral: Negative for agitation, behavioral problems, confusion, self-injury and suicidal ideas.      7/10  At  90  degree angle of the shoulder.  Knee panful with squatting 5/10. Otherwise it does not hurt. Objective:   Physical Exam Vitals signs and nursing note reviewed.  Constitutional:      Appearance: Normal appearance. She is normal weight.  HENT:     Head: Normocephalic and atraumatic.     Mouth/Throat:     Mouth: Mucous membranes are moist.  Eyes:     Extraocular Movements: Extraocular movements intact.     Conjunctiva/sclera: Conjunctivae normal.     Pupils: Pupils are equal, round, and reactive to light.  Neck:     Musculoskeletal: Normal range of motion and neck supple. No muscular tenderness.  Cardiovascular:     Rate and Rhythm: Normal rate and regular rhythm.     Heart sounds: Normal heart sounds.  Pulmonary:     Effort: Pulmonary effort is normal.     Breath sounds: Normal breath sounds.  Skin:    General: Skin is warm and dry.     Capillary Refill: Capillary refill takes less than 2 seconds.  Neurological:     General: No focal deficit present.     Mental Status: She is alert and oriented to person, place, and time.  Psychiatric:        Mood and  Affect: Mood normal.        Behavior: Behavior normal.        Thought Content: Thought content normal.        Judgment: Judgment normal.     No bruising noted, no abrasions. No cervical or thoracic or lunbar pain on palpation. Equal grip 5/5 strength. Left knee with miild swelling medially, no swelling noted posteriorly.    Assessment & Plan:  Left posterior knee pain Right shoulder pain (information given on rotator cuff injury. Declines xray at this time, will return to clinic if pain changes, worsens or does not improve... Also made aware of [facility] hours and location, given directions if she would like to see orthopedic doctor sooner than her appointment. Meds  ordered this encounter  Medications  . cyclobenzaprine (FLEXERIL) 5 MG tablet    Sig: Take 1 tablet (5 mg total) by mouth 3 (three) times daily as needed for muscle spasms.    Dispense:  21 tablet    Refill:  0  . etodolac (LODINE) 500 MG tablet    Sig: Take 1 tablet (500 mg total) by mouth 2 (two) times daily.    Dispense:  30 tablet    Refill:  0  She verbalizes understanding and has no questions at discharge.

## 2018-02-11 NOTE — Patient Instructions (Signed)
Heat Therapy Heat therapy can help ease sore, stiff, injured, and tight muscles and joints. Heat relaxes your muscles, which may help ease your pain and muscle spasms. Do not use heat therapy unless your doctor tells you to use it. How to use heat therapy There are several different kinds of heat therapy, including:  Moist heat pack.  Hot water bottle.  Electric heating pad.  Heated gel pack.  Heated wrap.  Warm water bath. Your doctor will tell you how to use heat therapy. In general, you should: 1. Place a towel between your skin and the heat source. 2. Leave the heat on for 20-30 minutes. Your skin may turn pink. 3. Remove the heat if your skin turns bright red. You should remove the heat source if you are unable to feel pain, heat, or cold. You are more likely to get burned if you leave it on the skin for too long. Your doctor may also tell you to take a warm water bath. To do this: 1. Put a non-slip pad in the bathtub to prevent a fall. 2. Fill the bathtub with warm water. 3. Check the water temperature. 4. Soak in the water for 15-20 minutes, or as told by your doctor. 5. Be careful when you stand up after the bath. You may feel dizzy. 6. Pat yourself dry after the bath. Do not rub your skin to dry it. General recommendations for heat therapy  Do not sleep while using heat therapy. Only use heat therapy while you are awake.  Your skin may turn pink while using heat therapy. Do not use heat therapy if your skin turns red.  Do not use heat therapy if you have a new injury.  High heat or using heat for a long time can cause burns. Be careful not to burn your skin when using heat therapy.  Do not use heat therapy on areas of your skin that are already irritated, such as with a rash or sunburn. Get help if you have:  Blisters, redness, swelling (puffiness), or numbness.  New pain.  Pain that is getting worse. Summary  Heat therapy is the use of heat to help ease  sore, stiff, injured, and tight muscles and joints.  There are different types of heat therapy. Your doctor will tell you which one to use.  Only use heat therapy while you are awake.  Watch your skin to make sure you do not get burned while using heat therapy. This information is not intended to replace advice given to you by your health care provider. Make sure you discuss any questions you have with your health care provider. Document Released: 04/09/2011 Document Revised: 01/26/2017 Document Reviewed: 01/26/2017 Elsevier Interactive Patient Education  2019 [employer] Cryotherapy What is cryotherapy? Cryotherapy, or cold therapy, is a treatment that uses cold temperatures to treat an injury or medical condition. It includes using cold packs or ice packs to reduce pain and swelling. Only use cryotherapy if your doctor says it is okay. How do I use cryotherapy?   Place a towel between the cold source and your skin.  Apply the cold source for no more than 20 minutes at a time.  Check your skin after 5 minutes to make sure there are no signs of a poor response to cold or skin damage. Check for:  White spots on your skin. Your skin may look blotchy or mottled.  Skin that looks blue or pale.  Skin that feels waxy or hard.  Repeat these steps as many times each day as told by your doctor. How can I make a cold pack? When using a cold pack at home to reduce pain and swelling, you can use:  A silica gel cold pack that has been left in the freezer. You can buy this online or in stores.  A plastic bag of frozen vegetables.  A sealable plastic bag that has been filled with crushed ice. Always wrap the pack in a dry or damp towel to avoid direct contact with your skin. Contact a doctor if:  You start to have white spots on your skin. This may give your skin a blotchy or mottled look. ? Your skin turns blue or pale. ? Your skin becomes waxy or hard. ? Your swelling gets  worse. This information is not intended to replace advice given to you by your health care provider. Make sure you discuss any questions you have with your health care provider. Document Released: 07/04/2007 Document Revised: 03/22/2016 Document Reviewed: 09/29/2014 Elsevier Interactive Patient Education  2019 [employer] Acute Knee Pain, Adult Many things can cause knee pain. Sometimes, knee pain is sudden (acute) and may be caused by damage, swelling, or irritation of the muscles and tissues that support your knee. The pain often goes away on its own with time and rest. If the pain does not go away, tests may be done to find out what is causing the pain. Follow these instructions at home: Pay attention to any changes in your symptoms. Take these actions to relieve your pain. If you have a knee sleeve or brace:   Wear the sleeve or brace as told by your doctor. Remove it only as told by your doctor.  Loosen the sleeve or brace if your toes: ? Tingle. ? Become numb. ? Turn cold and blue.  Keep the sleeve or brace clean.  If the sleeve or brace is not waterproof: ? Do not let it get wet. ? Cover it with a watertight covering when you take a bath or shower. Activity  Rest your knee.  Do not do things that cause pain.  Avoid activities where both feet leave the ground at the same time (high-impact activities). Examples are running, jumping rope, and doing jumping jacks.  Work with a physical therapist to make a safe exercise program, as told by your doctor. Managing pain, stiffness, and swelling   If told, put ice on the knee: ? Put ice in a plastic bag. ? Place a towel between your skin and the bag. ? Leave the ice on for 20 minutes, 2-3 times a day.  If told, put pressure (compression) on your injured knee to control swelling, give support, and help with discomfort. Compression may be done with an elastic bandage. General instructions  Take all medicines only as told by  your doctor.  Raise (elevate) your knee while you are sitting or lying down. Make sure your knee is higher than your heart.  Sleep with a pillow under your knee.  Do not use any products that contain nicotine or tobacco. These include cigarettes, e-cigarettes, and chewing tobacco. These products may slow down healing. If you need help quitting, ask your doctor.  If you are overweight, work with your doctor and a food expert (dietitian) to set goals to lose weight. Being overweight can make your knee hurt more.  Keep all follow-up visits as told by your doctor. This is important. Contact a doctor if:  The knee pain does not  stop.  The knee pain changes or gets worse.  You have a fever along with knee pain.  Your knee feels warm when you touch it.  Your knee gives out or locks up. Get help right away if:  Your knee swells, and the swelling gets worse.  You cannot move your knee.  You have very bad knee pain. Summary  Many things can cause knee pain. The pain often goes away on its own with time and rest.  Your doctor may do tests to find out the cause of the pain.  Pay attention to any changes in your symptoms. Relieve your pain with rest, medicines, light activity, and use of ice.  Get help right away if you cannot move your knee or your knee pain is very bad. This information is not intended to replace advice given to you by your health care provider. Make sure you discuss any questions you have with your health care provider. Document Released: 04/13/2008 Document Revised: 06/27/2017 Document Reviewed: 06/27/2017 Elsevier Interactive Patient Education  2019 [employer] Rotator Cuff Tear  A rotator cuff tear is a partial or complete tear of the cord-like bands (tendons) that connect muscle to bone in the rotator cuff. The rotator cuff is a group of muscles and tendons that surround the shoulder joint and keep the upper arm bone (humerus) in the shoulder socket. The tear  can occur suddenly (acute tear) or can develop over a long period of time (chronic tear). What are the causes? Acute tears may be caused by:  A fall, especially on an outstretched arm.  Lifting very heavy objects with a jerking motion. Chronic tears may be caused by overuse of the muscles. This may happen in sports, physical work, or activities in which your arm repeatedly moves over your head. What increases the risk? This condition is more likely to occur in:  Athletes and workers who frequently use their shoulder or reach over their heads. This may include activities such as: ? Tennis. ? Baseball and softball. ? Swimming and rowing. ? Weightlifting. ? Holiday representativeConstruction work. ? Painting.  People who smoke.  Older people who have arthritis or poor blood supply. These can make the muscles and tendons weaker. What are the signs or symptoms? Symptoms of this condition depend on the type and severity of the injury:  An acute tear may include a sudden tearing feeling, followed by severe pain that goes from your upper shoulder, down your arm, and toward your elbow.  A chronic tear includes a gradual weakness and decreased shoulder motion as the pain gets worse. The pain is usually worse at night. Both types may have symptoms such as:  Pain that spreads (radiates) from the shoulder to the upper arm.  Swelling and tenderness in front of the shoulder.  Decreased range of motion.  Pain when: ? Reaching, pulling, or lifting the arm above the head. ? Lowering the arm from above the head.  Not being able to raise your arm out to the side.  Difficulty placing the arm behind your back. How is this diagnosed? This condition is diagnosed with a medical history and physical exam. Imaging tests may also be done, including:  X-rays.  MRI.  Ultrasound.  CT or MR arthrogram. During this test, a contrast material is injected into your shoulder and then images are taken. How is this  treated? Treatment for this condition depends on the type and severity of the condition. In less severe cases, treatment may include:  Rest.  This may be done with a sling that holds the shoulder still (immobilization). Your health care provider may also recommend avoiding activities that involve lifting your arm over your head.  Icing the shoulder.  Anti-inflammatory medicines, such as aspirin or ibuprofen.  Strengthening and stretching exercises. Your health care provider may recommend specific exercises to improve your range of motion and strengthen your shoulder. In more severe cases, treatment may include:  Physical therapy.  Steroid injections.  Surgery. Follow these instructions at home: Managing pain, stiffness, and swelling  If directed, put ice on the injured area. ? If you have a removable sling, remove it as told by your health care provider. ? Put ice in a plastic bag. ? Place a towel between your skin and the bag. ? Leave the ice on for 20 minutes, 2-3 times a day.  Raise (elevate) the injured area above the level of your heart while you are lying down.  Find a comfortable sleeping position or sleep on a recliner, if available.  Move your fingers often to avoid stiffness and to lessen swelling.  Once the swelling has gone down, your health care provider may direct you to apply heat to relax the muscles. Use the heat source that your health care provider recommends, such as a moist heat pack or a heating pad. ? Place a towel between your skin and the heat source. ? Leave the heat on for 20-30 minutes. ? Remove the heat if your skin turns bright red. This is especially important if you are unable to feel pain, heat, or cold. You may have a greater risk of getting burned. If you have a sling:  Wear the sling as told by your health care provider. Remove it only as told by your health care provider.  Loosen the sling if your fingers tingle, become numb, or turn cold  and blue.  Keep the sling clean.  If the sling is not waterproof: ? Do not let it get wet. ? Cover it with a watertight covering when you take a bath or a shower. Driving  Do not drive or use heavy machinery while taking prescription pain medicine.  Ask your health care provider when it is safe to drive if you have a sling on your arm. Activity  Rest your shoulder as told by your health care provider.  Return to your normal activities as told by your health care provider. Ask your health care provider what activities are safe for you.  Do any exercises or stretches as told by your health care provider. General instructions  Do not use any products that contain nicotine or tobacco, such as cigarettes and e-cigarettes. If you need help quitting, ask your health care provider.  Take over-the-counter and prescription medicines only as told by your health care provider.  Keep all follow-up visits as told by your health care provider. This is important. Contact a health care provider if:  Your pain gets worse.  You have new pain in your arm, hands, or fingers.  Medicine does not help your pain. Get help right away if:  Your arm, hand, or fingers are numb or tingling.  Your arm, hand, or fingers are swollen or painful or they turn white or blue.  Your hand or fingers on your injured arm are colder than your other hand. Summary  A rotator cuff tear is a partial or complete tear of the cord-like bands (tendons) that connect muscle to bone in the rotator cuff.  The tear can  occur suddenly (acute tear) or can develop over a long period of time (chronic tear).  Treatment generally includes rest, anti-inflammatory medicines, and icing. In some cases, physical therapy and steroid injections may be needed. In severe cases, surgery may be needed. This information is not intended to replace advice given to you by your health care provider. Make sure you discuss any questions you have  with your health care provider. Document Released: 01/13/2000 Document Revised: 04/02/2016 Document Reviewed: 04/02/2016 Elsevier Interactive Patient Education  2019 [employer] Shoulder Pain Many things can cause shoulder pain, including:  An injury.  Moving the shoulder in the same way again and again (overuse).  Joint pain (arthritis). Pain can come from:  Swelling and irritation (inflammation) of any part of the shoulder.  An injury to the shoulder joint.  An injury to: ? Tissues that connect muscle to bone (tendons). ? Tissues that connect bones to each other (ligaments). ? Bones. Follow these instructions at home: Watch for changes in your symptoms. Let your doctor know about them. Follow these instructions to help with your pain. If you have a sling:  Wear the sling as told by your doctor. Remove it only as told by your doctor.  Loosen the sling if your fingers: ? Tingle. ? Become numb. ? Turn cold and blue.  Keep the sling clean.  If the sling is not waterproof: ? Do not let it get wet. ? Take the sling off when you shower or bathe. Managing pain, stiffness, and swelling   If told, put ice on the painful area: ? Put ice in a plastic bag. ? Place a towel between your skin and the bag. ? Leave the ice on for 20 minutes, 2-3 times a day. Stop putting ice on if it does not help with the pain.  Squeeze a soft ball or a foam pad as much as possible. This prevents swelling in the shoulder. It also helps to strengthen the arm. General instructions  Take over-the-counter and prescription medicines only as told by your doctor.  Keep all follow-up visits as told by your doctor. This is important. Contact a doctor if:  Your pain gets worse.  Medicine does not help your pain.  You have new pain in your arm, hand, or fingers. Get help right away if:  Your arm, hand, or fingers: ? Tingle. ? Are numb. ? Are swollen. ? Are painful. ? Turn white or  blue. Summary  Shoulder pain can be caused by many things. These include injury, moving the shoulder in the same away again and again, and joint pain.  Watch for changes in your symptoms. Let your doctor know about them.  This condition may be treated with a sling, ice, and pain medicine.  Contact your doctor if the pain gets worse or you have new pain. Get help right away if your arm, hand, or fingers tingle or get numb, swollen, or painful.  Keep all follow-up visits as told by your doctor. This is important. This information is not intended to replace advice given to you by your health care provider. Make sure you discuss any questions you have with your health care provider. Document Released: 07/04/2007 Document Revised: 07/30/2017 Document Reviewed: 07/30/2017 Elsevier Interactive Patient Education  2019 [employer].

## 2018-02-19 ENCOUNTER — Telehealth: Payer: Self-pay | Admitting: Obstetrics and Gynecology

## 2018-02-19 ENCOUNTER — Encounter: Payer: Self-pay | Admitting: *Deleted

## 2018-02-19 NOTE — Telephone Encounter (Signed)
The patient states she would like to speak to her nurse about a medication that she needs a refill on and is not showing up in her MyChart when she looked, please advise, thanks.

## 2018-02-20 ENCOUNTER — Other Ambulatory Visit: Payer: Self-pay | Admitting: *Deleted

## 2018-02-20 MED ORDER — RIZATRIPTAN BENZOATE 10 MG PO TBDP: 10.0000 mg | ORAL_TABLET | ORAL | 3 refills | Status: DC | PRN

## 2018-02-20 NOTE — Telephone Encounter (Signed)
Done-ac 

## 2018-02-25 DIAGNOSIS — D8989 Other specified disorders involving the immune mechanism, not elsewhere classified: Secondary | ICD-10-CM | POA: Diagnosis not present

## 2018-02-25 DIAGNOSIS — Z79899 Other long term (current) drug therapy: Secondary | ICD-10-CM | POA: Diagnosis not present

## 2018-02-25 DIAGNOSIS — M359 Systemic involvement of connective tissue, unspecified: Secondary | ICD-10-CM | POA: Diagnosis not present

## 2018-03-18 ENCOUNTER — Other Ambulatory Visit: Payer: Self-pay | Admitting: *Deleted

## 2018-03-18 DIAGNOSIS — M329 Systemic lupus erythematosus, unspecified: Secondary | ICD-10-CM | POA: Diagnosis not present

## 2018-03-18 DIAGNOSIS — Z79899 Other long term (current) drug therapy: Secondary | ICD-10-CM | POA: Diagnosis not present

## 2018-03-18 MED ORDER — ALPRAZOLAM 0.5 MG PO TABS: 0.5000 mg | ORAL_TABLET | Freq: Three times a day (TID) | ORAL | 1 refills | Status: DC | PRN

## 2018-04-02 DIAGNOSIS — Z79899 Other long term (current) drug therapy: Secondary | ICD-10-CM | POA: Diagnosis not present

## 2018-06-30 DIAGNOSIS — M329 Systemic lupus erythematosus, unspecified: Secondary | ICD-10-CM | POA: Diagnosis not present

## 2018-06-30 DIAGNOSIS — Z79899 Other long term (current) drug therapy: Secondary | ICD-10-CM | POA: Diagnosis not present

## 2018-06-30 LAB — HM HEPATITIS C SCREENING LAB: HM Hepatitis Screen: NEGATIVE

## 2018-08-03 DIAGNOSIS — Z1159 Encounter for screening for other viral diseases: Secondary | ICD-10-CM | POA: Diagnosis not present

## 2018-08-14 ENCOUNTER — Encounter: Payer: Self-pay | Admitting: Medical

## 2018-08-14 ENCOUNTER — Ambulatory Visit: Payer: Self-pay | Admitting: Medical

## 2018-08-14 ENCOUNTER — Other Ambulatory Visit: Payer: Self-pay

## 2018-08-14 VITALS — BP 128/70 | HR 78 | Temp 98.2°F | Resp 18 | Ht 67.0 in | Wt 160.0 lb

## 2018-08-14 DIAGNOSIS — R590 Localized enlarged lymph nodes: Secondary | ICD-10-CM

## 2018-08-14 DIAGNOSIS — H6592 Unspecified nonsuppurative otitis media, left ear: Secondary | ICD-10-CM

## 2018-08-14 NOTE — Patient Instructions (Addendum)
Caroline Mullen, please consider Allegra-D as directed for about 3-5 days (hold the Zyrtec during that time) You can take over the counter Ibuprofen or Motrin to help with the painful inflamed lymph node  Fluids, hydration, rest and keeping your stress down is encouraged Encouraged patient to call the office or primary care doctor for an appointment if no improvement in symptoms or if symptoms change or worsen after 72 hours of planned treatment. Patient verbalized understanding of all instructions given/reviewed and has no further questions or concerns at this time.

## 2018-08-14 NOTE — Progress Notes (Signed)
   Subjective:    Patient ID: Caroline Mullen, female    DOB: [birth year], 48 y.o.   MRN: [id number]  HPIMelissa is her etoday with c/o right swollen lymph nodes and right ear pressure. She denies pain or changes in her hearing. Reports symptoms started 4 days ago and has take Zyrtec for symptoms. She doesn't feel Zyrtec helped but admits symptoms aren't as severe this morning. She reports she was outside over the weekend doing yard work and the next day she developed a sore throat and right ear discomfort. This morning she had clear nasal discharge. She has been tested for COVID in the last month and reports results were negative. Admits new onset of lupus and followed by specialist.    Review of Systems  Constitutional: Negative for fatigue and fever.  HENT: Positive for sore throat.        Ear pressure, runny nose, lymph node swelling and pain  Eyes: Negative for discharge and itching.  Respiratory: Negative for cough and shortness of breath.   Cardiovascular: Negative for chest pain.  Skin: Negative for rash.        Objective:   Physical Exam Vitals signs reviewed.  Constitutional:      General: She is not in acute distress.    Appearance: Normal appearance. She is well-developed.  HENT:     Head: Normocephalic and atraumatic.     Right Ear: Ear canal normal.     Left Ear: Ear canal normal.     Ears:     Comments: Left ear with clear serous fluid and bubbles. Right unremarkable. No evidence of erythema bilaterally.    Nose: Nose normal.     Mouth/Throat:     Mouth: Mucous membranes are moist.     Comments: Mildly injected Neck:     Musculoskeletal: Normal range of motion and neck supple. Muscular tenderness present.     Comments: bilateral cervical lymphadenopathy with right cervical tenderness Cardiovascular:     Rate and Rhythm: Normal rate and regular rhythm.     Heart sounds: Normal heart sounds.  Pulmonary:     Effort: Pulmonary effort is normal. No respiratory  distress.     Breath sounds: Normal breath sounds.  Abdominal:     General: Bowel sounds are normal.     Palpations: Abdomen is soft.  Musculoskeletal: Normal range of motion.  Lymphadenopathy:     Cervical: Cervical adenopathy present.  Skin:    General: Skin is warm and dry.  Neurological:     General: No focal deficit present.     Mental Status: She is alert and oriented to person, place, and time.  Psychiatric:        Mood and Affect: Mood normal.           Assessment & Plan:

## 2018-08-23 ENCOUNTER — Other Ambulatory Visit: Payer: Self-pay | Admitting: Obstetrics and Gynecology

## 2018-09-11 ENCOUNTER — Other Ambulatory Visit: Payer: Self-pay | Admitting: Obstetrics and Gynecology

## 2018-09-11 DIAGNOSIS — Z1231 Encounter for screening mammogram for malignant neoplasm of breast: Secondary | ICD-10-CM

## 2018-09-30 DIAGNOSIS — M329 Systemic lupus erythematosus, unspecified: Secondary | ICD-10-CM | POA: Diagnosis not present

## 2018-09-30 DIAGNOSIS — Z79899 Other long term (current) drug therapy: Secondary | ICD-10-CM | POA: Diagnosis not present

## 2018-10-17 ENCOUNTER — Ambulatory Visit
Admission: RE | Admit: 2018-10-17 | Discharge: 2018-10-17 | Disposition: A | Discharge status: Home / Self Care | Payer: BC Managed Care – PPO | Source: Ambulatory Visit | Attending: Obstetrics and Gynecology | Admitting: Obstetrics and Gynecology

## 2018-10-17 DIAGNOSIS — R599 Enlarged lymph nodes, unspecified: Secondary | ICD-10-CM | POA: Diagnosis not present

## 2018-10-17 DIAGNOSIS — E890 Postprocedural hypothyroidism: Secondary | ICD-10-CM | POA: Diagnosis not present

## 2018-10-17 DIAGNOSIS — Z1231 Encounter for screening mammogram for malignant neoplasm of breast: Secondary | ICD-10-CM | POA: Insufficient documentation

## 2018-10-17 DIAGNOSIS — M3210 Systemic lupus erythematosus, organ or system involvement unspecified: Secondary | ICD-10-CM | POA: Diagnosis not present

## 2018-10-28 ENCOUNTER — Encounter: Payer: Self-pay | Admitting: Obstetrics and Gynecology

## 2018-10-28 ENCOUNTER — Other Ambulatory Visit (HOSPITAL_COMMUNITY)
Admission: RE | Admit: 2018-10-28 | Discharge: 2018-10-28 | Disposition: A | Discharge status: Home / Self Care | Payer: BC Managed Care – PPO | Source: Ambulatory Visit | Attending: Obstetrics and Gynecology | Admitting: Obstetrics and Gynecology

## 2018-10-28 ENCOUNTER — Other Ambulatory Visit: Payer: Self-pay

## 2018-10-28 ENCOUNTER — Ambulatory Visit (INDEPENDENT_AMBULATORY_CARE_PROVIDER_SITE_OTHER): Payer: BC Managed Care – PPO | Admitting: Obstetrics and Gynecology

## 2018-10-28 VITALS — BP 118/72 | HR 74 | Ht 67.0 in | Wt 155.5 lb

## 2018-10-28 DIAGNOSIS — Z01419 Encounter for gynecological examination (general) (routine) without abnormal findings: Secondary | ICD-10-CM

## 2018-10-28 DIAGNOSIS — E782 Mixed hyperlipidemia: Secondary | ICD-10-CM

## 2018-10-28 DIAGNOSIS — M359 Systemic involvement of connective tissue, unspecified: Secondary | ICD-10-CM

## 2018-10-28 DIAGNOSIS — E538 Deficiency of other specified B group vitamins: Secondary | ICD-10-CM | POA: Diagnosis not present

## 2018-10-28 NOTE — Progress Notes (Signed)
Subjective:   Caroline Mullen is a 48 y.o. G87P0 Caucasian female here for a routine well-woman exam.  No LMP recorded. Patient has had a hysterectomy.    Current complaints: recently diagnosed with Lupus, working from home due to pandemic. Has had a few episodes of vaginal spotting but was told in menopause- had some ovarian cramping the week before.  PCP: none       does desire labs  Social History: Sexual: heterosexual Marital Status: married Living situation: with spouse Occupation: [employer] Tobacco/alcohol: no tobacco use Illicit drugs: no history of illicit drug use  The following portions of the patient's history were reviewed and updated as appropriate: allergies, current medications, past family history, past medical history, past social history, past surgical history and problem list.  Past Medical History Past Medical History:  Diagnosis Date  . Anxiety   . Connective tissue disease (HCC)    on Plaquenil    NO LONGER ON MEDS NO PROBLEMS X 6 YEARS  . Hyperthyroidism   . Migraine     Past Surgical History Past Surgical History:  Procedure Laterality Date  . ABDOMINAL HYSTERECTOMY     fibroid uterine tumors supracervical  . CESAREAN SECTION    . KNEE ARTHROSCOPY    . SHOULDER SURGERY  1998  . THYROIDECTOMY N/A 08/08/2015   Procedure: THYROIDECTOMY;  Surgeon: Margaretha Sheffield, MD;  Location: [facility] ORS;  Service: ENT;  Laterality: N/A;  . TUBAL LIGATION      Gynecologic History G2P0  No LMP recorded. Patient has had a hysterectomy. Contraception: status post hysterectomy Last Pap: 2016. Results were: normal Last mammogram: 09/2018. Results were: normal   Obstetric History OB History  Gravida Para Term Preterm AB Living  2            SAB TAB Ectopic Multiple Live Births               # Outcome Date GA Lbr Len/2nd Weight Sex Delivery Anes PTL Lv  2 Gravida 2002    F CS-Unspec     1 Gravida             Current Medications Current Outpatient  Medications on File Prior to Visit  Medication Sig Dispense Refill  . ALPRAZolam (XANAX) 0.5 MG tablet Take 1 tablet (0.5 mg total) by mouth 3 (three) times daily as needed. for anxiety 45 tablet 1  . cetirizine (ZYRTEC) 10 MG chewable tablet Chew 10 mg by mouth daily.    . hydroxychloroquine (PLAQUENIL) 200 MG tablet TAKE 1 AND 1/2 TABLETS(300 MG) BY MOUTH DAILY 45 tablet 2  . ibuprofen (ADVIL,MOTRIN) 200 MG tablet Take 600 mg by mouth every 6 (six) hours as needed for moderate pain.     Marland Kitchen leflunomide (ARAVA) 10 MG tablet Take 10 mg by mouth daily.    Marland Kitchen levothyroxine (SYNTHROID, LEVOTHROID) 125 MCG tablet Take 125 mcg by mouth daily before breakfast.    . Multiple Vitamin (MULTIVITAMIN) capsule Take by mouth.    . rizatriptan (MAXALT-MLT) 10 MG disintegrating tablet Take 1 tablet (10 mg total) by mouth as needed for migraine. May repeat in 2 hours if needed 10 tablet 3  . SUMAtriptan (IMITREX) 25 MG tablet Take 25 mg by mouth.     No current facility-administered medications on file prior to visit.     Review of Systems Patient denies any headaches, blurred vision, shortness of breath, chest pain, abdominal pain, problems with bowel movements, urination, or intercourse.  Objective:  BP 118/72   Pulse 74   Ht 5\' 7"  (1.702 m)   Wt 155 lb 8 oz (70.5 kg)   BMI 24.35 kg/m  Physical Exam  General:  Well developed, well nourished, no acute distress. She is alert and oriented x3. Skin:  Warm and dry Neck:  Midline trachea, no thyromegaly or nodules Cardiovascular: Regular rate and rhythm, no murmur heard Lungs:  Effort normal, all lung fields clear to auscultation bilaterally Breasts:  No dominant palpable mass, retraction, or nipple discharge Abdomen:  Soft, non tender, no hepatosplenomegaly or masses Pelvic:  External genitalia is normal in appearance.  The vagina is normal in appearance. The cervix is bulbous, no CMT.  Thin prep pap is done with HR HPV cotesting. Uterus is surgically  absent.  No adnexal masses or tenderness noted. Extremities:  No swelling or varicosities noted Psych:  She has a normal mood and affect  Assessment:   Healthy well-woman exam H/o B12 deficiency Hyperlipidemia Lupus Vaginal spotting    Plan:  Labs obtained-will follow up accordingly Declined flu vaccine F/U 1 year for AE, or sooner if needed Mammogram done 09/2018 Elwyn Lowden 10/2018, CNM

## 2018-10-29 LAB — B12 AND FOLATE PANEL
Folate: >20 ng/mL (ref >3.0)
Vitamin B-12: 733 pg/mL (ref 232–1245)

## 2018-10-29 LAB — THYROID PANEL WITH TSH
Free Thyroxine Index: 2.9 (ref 1.2–4.9)
T3 Uptake Ratio: 32 % (ref 24–39)
T4, Total: 9.2 ug/dL (ref 4.5–12.0)
TSH: 1.17 u[IU]/mL (ref 0.450–4.500)

## 2018-10-29 LAB — NMR, LIPOPROFILE
Cholesterol, Total: 196 mg/dL (ref 100–199)
HDL Particle Number: 20.7 umol/L — ABNORMAL LOW (ref >=30.5)
HDL-C: 32 mg/dL — ABNORMAL LOW (ref >39)
LDL Particle Number: 2101 nmol/L — ABNORMAL HIGH (ref <1000)
LDL Size: 20.3 nm — ABNORMAL LOW (ref >20.5)
LDL-C (NIH Calc): 136 mg/dL — ABNORMAL HIGH (ref 0–99)
LP-IR Score: 62 — ABNORMAL HIGH (ref <=45)
Small LDL Particle Number: 1430 nmol/L — ABNORMAL HIGH (ref <=527)
Triglycerides: 157 mg/dL — ABNORMAL HIGH (ref 0–149)

## 2018-10-29 LAB — COMPREHENSIVE METABOLIC PANEL
ALT: 14 IU/L (ref 0–32)
AST: 16 IU/L (ref 0–40)
Albumin/Globulin Ratio: 2.1 (ref 1.2–2.2)
Albumin: 4.6 g/dL (ref 3.8–4.8)
Alkaline Phosphatase: 53 IU/L (ref 39–117)
BUN/Creatinine Ratio: 23 (ref 9–23)
BUN: 18 mg/dL (ref 6–24)
Bilirubin Total: 0.6 mg/dL (ref 0.0–1.2)
CO2: 23 mmol/L (ref 20–29)
Calcium: 9 mg/dL (ref 8.7–10.2)
Chloride: 101 mmol/L (ref 96–106)
Creatinine, Ser: 0.79 mg/dL (ref 0.57–1.00)
GFR calc Af Amer: 102 mL/min/{1.73_m2} (ref >59)
GFR calc non Af Amer: 88 mL/min/{1.73_m2} (ref >59)
Globulin, Total: 2.2 g/dL (ref 1.5–4.5)
Glucose: 98 mg/dL (ref 65–99)
Potassium: 4.2 mmol/L (ref 3.5–5.2)
Sodium: 139 mmol/L (ref 134–144)
Total Protein: 6.8 g/dL (ref 6.0–8.5)

## 2018-10-29 LAB — HEMOGLOBIN A1C
Est. average glucose Bld gHb Est-mCnc: 117 mg/dL
Hgb A1c MFr Bld: 5.7 % — ABNORMAL HIGH (ref 4.8–5.6)

## 2018-10-29 LAB — VITAMIN D 25 HYDROXY (VIT D DEFICIENCY, FRACTURES): Vit D, 25-Hydroxy: 33.3 ng/mL (ref 30.0–100.0)

## 2018-10-29 LAB — FOLLICLE STIMULATING HORMONE: FSH: 16.4 m[IU]/mL

## 2018-11-03 LAB — CYTOLOGY - PAP
Diagnosis: NEGATIVE
HPV 16: NEGATIVE
HPV 18 / 45: NEGATIVE
High risk HPV: POSITIVE — AB

## 2018-12-05 ENCOUNTER — Telehealth: Payer: Self-pay

## 2018-12-05 NOTE — Telephone Encounter (Signed)
Patient calls c/o bad cough, sore throat and sinus drainage for several days. Instructed patient to take Mucinex as prescribed and Tylenol/Motrin as needed for discomfort.  If symptoms are not any better in a few days then patient may want to consider a covid test. She will call us back on Monday if no improvement over the weekend.

## 2018-12-10 ENCOUNTER — Telehealth: Payer: Self-pay | Admitting: Medical

## 2018-12-10 DIAGNOSIS — R059 Cough, unspecified: Secondary | ICD-10-CM

## 2018-12-10 DIAGNOSIS — R05 Cough: Secondary | ICD-10-CM

## 2018-12-10 DIAGNOSIS — Z20828 Contact with and (suspected) exposure to other viral communicable diseases: Secondary | ICD-10-CM | POA: Diagnosis not present

## 2018-12-10 DIAGNOSIS — R0989 Other specified symptoms and signs involving the circulatory and respiratory systems: Secondary | ICD-10-CM

## 2018-12-10 DIAGNOSIS — J029 Acute pharyngitis, unspecified: Secondary | ICD-10-CM

## 2018-12-10 NOTE — Telephone Encounter (Signed)
Returned patient call, She has  cough , sore throat, runny nose clear, worse then last Friday 12/05/2018. Recommended patient get COVID-19 tested. She says at the [facility] there is a long line, Recommended other testing sites, [facility], [facility], [facility], she states that  [facility] is a  2 hour wait. Most likely due to the increase of cases of COVID-19 there are longer waits.. She denies fever or chills, CP or SOB and does not recall any known exposure.  She states she  has a history of Bronchitis yearly and is a non smoker.   She thought testing was being done [facility]. I explained we had done no testing , but that [facility] had been holding testing clinics. Recommended  Symptomatic control , Like  Motrin or Tylenol if not feeling well or fever. Rest and increase fluids. OTC Cough medicition like  Delsym take per package instructions. To call back if she has any concerns. May also take Zyrtec, Claritin or Allegra to help dry up PND. She verbalizes understanding and has no questions at discharge.  Cough noted on phone call. Patient able to speak in full sentences.

## 2018-12-22 ENCOUNTER — Encounter: Payer: Self-pay | Admitting: Emergency Medicine

## 2018-12-22 ENCOUNTER — Other Ambulatory Visit: Payer: Self-pay

## 2018-12-22 ENCOUNTER — Emergency Department: Payer: BC Managed Care – PPO

## 2018-12-22 ENCOUNTER — Emergency Department
Admission: EM | Admit: 2018-12-22 | Discharge: 2018-12-22 | Disposition: A | Discharge status: Home / Self Care | Payer: BC Managed Care – PPO | Attending: Student in an Organized Health Care Education/Training Program | Admitting: Student in an Organized Health Care Education/Training Program

## 2018-12-22 DIAGNOSIS — Z79899 Other long term (current) drug therapy: Secondary | ICD-10-CM | POA: Diagnosis not present

## 2018-12-22 DIAGNOSIS — R05 Cough: Secondary | ICD-10-CM | POA: Insufficient documentation

## 2018-12-22 DIAGNOSIS — R200 Anesthesia of skin: Secondary | ICD-10-CM | POA: Insufficient documentation

## 2018-12-22 DIAGNOSIS — R519 Headache, unspecified: Secondary | ICD-10-CM | POA: Insufficient documentation

## 2018-12-22 DIAGNOSIS — G43909 Migraine, unspecified, not intractable, without status migrainosus: Secondary | ICD-10-CM | POA: Diagnosis not present

## 2018-12-22 DIAGNOSIS — Z20828 Contact with and (suspected) exposure to other viral communicable diseases: Secondary | ICD-10-CM | POA: Insufficient documentation

## 2018-12-22 DIAGNOSIS — Z87891 Personal history of nicotine dependence: Secondary | ICD-10-CM | POA: Insufficient documentation

## 2018-12-22 DIAGNOSIS — R059 Cough, unspecified: Secondary | ICD-10-CM

## 2018-12-22 DIAGNOSIS — R0602 Shortness of breath: Secondary | ICD-10-CM | POA: Diagnosis not present

## 2018-12-22 HISTORY — DX: Systemic lupus erythematosus, unspecified: M32.9

## 2018-12-22 HISTORY — DX: Reserved for concepts with insufficient information to code with codable children: IMO0002

## 2018-12-22 LAB — COMPREHENSIVE METABOLIC PANEL
ALT: 17 U/L (ref 0–44)
AST: 19 U/L (ref 15–41)
Albumin: 4.2 g/dL (ref 3.5–5.0)
Alkaline Phosphatase: 46 U/L (ref 38–126)
Anion gap: 8 (ref 5–15)
BUN: 12 mg/dL (ref 6–20)
CO2: 23 mmol/L (ref 22–32)
Calcium: 8.3 mg/dL — ABNORMAL LOW (ref 8.9–10.3)
Chloride: 107 mmol/L (ref 98–111)
Creatinine, Ser: 0.61 mg/dL (ref 0.44–1.00)
GFR calc Af Amer: >60 mL/min (ref >60)
GFR calc non Af Amer: >60 mL/min (ref >60)
Glucose, Bld: 114 mg/dL — ABNORMAL HIGH (ref 70–99)
Potassium: 3.8 mmol/L (ref 3.5–5.1)
Sodium: 138 mmol/L (ref 135–145)
Total Bilirubin: 0.9 mg/dL (ref 0.3–1.2)
Total Protein: 7.2 g/dL (ref 6.5–8.1)

## 2018-12-22 LAB — CBC WITH DIFFERENTIAL/PLATELET
Abs Immature Granulocytes: 0.02 10*3/uL (ref 0.00–0.07)
Basophils Absolute: 0.1 10*3/uL (ref 0.0–0.1)
Basophils Relative: 1 %
Eosinophils Absolute: 0.1 10*3/uL (ref 0.0–0.5)
Eosinophils Relative: 1 %
HCT: 41 % (ref 36.0–46.0)
Hemoglobin: 14.3 g/dL (ref 12.0–15.0)
Immature Granulocytes: 0 %
Lymphocytes Relative: 29 %
Lymphs Abs: 2.1 10*3/uL (ref 0.7–4.0)
MCH: 30.4 pg (ref 26.0–34.0)
MCHC: 34.9 g/dL (ref 30.0–36.0)
MCV: 87.2 fL (ref 80.0–100.0)
Monocytes Absolute: 0.5 10*3/uL (ref 0.1–1.0)
Monocytes Relative: 7 %
Neutro Abs: 4.6 10*3/uL (ref 1.7–7.7)
Neutrophils Relative %: 62 %
Platelets: 263 10*3/uL (ref 150–400)
RBC: 4.7 MIL/uL (ref 3.87–5.11)
RDW: 12.4 % (ref 11.5–15.5)
WBC: 7.5 10*3/uL (ref 4.0–10.5)
nRBC: 0 % (ref 0.0–0.2)

## 2018-12-22 LAB — FIBRIN DERIVATIVES D-DIMER (ARMC ONLY): Fibrin derivatives D-dimer (ARMC): 391.78 ng/mL (FEU) (ref 0.00–499.00)

## 2018-12-22 MED ORDER — DEXAMETHASONE 4 MG PO TABS
10.0000 mg | ORAL_TABLET | Freq: Once | ORAL | Status: AC
  Administered 2018-12-22: 10 mg via ORAL
  Filled 2018-12-22: qty 2.5

## 2018-12-22 MED ORDER — PREDNISONE 20 MG PO TABS: 40.0000 mg | ORAL_TABLET | Freq: Every day | ORAL | 0 refills | Status: AC

## 2018-12-22 MED ORDER — PROCHLORPERAZINE MALEATE 10 MG PO TABS
10.0000 mg | ORAL_TABLET | Freq: Once | ORAL | Status: AC
  Administered 2018-12-22: 10 mg via ORAL
  Filled 2018-12-22: qty 1
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
## 2018-12-22 NOTE — Discharge Instructions (Signed)
Please follow up with ophthalmology.  I am also giving you a referral to neurology clinic.  I have prescribed a 4 day course of prednisone to help with the cough.

## 2018-12-22 NOTE — ED Triage Notes (Addendum)
Says she has had a couple migrianes with zig zags in left eye.  Also has numbness and soreness to right periorbital area with shooting pain with lifing of eye borw for few days.  Says she has lupus

## 2018-12-22 NOTE — ED Notes (Signed)
See triage note  Presents with pain to right side of face for several days   States she did have some sinus pressure  Placed on z-pak  Also had negative COVID on 11/14  But over the past 2-3 days   She has had Occular h/a 's on the left
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
## 2018-12-22 NOTE — ED Notes (Signed)

## 2018-12-23 DIAGNOSIS — G43B0 Ophthalmoplegic migraine, not intractable: Secondary | ICD-10-CM | POA: Diagnosis not present

## 2018-12-23 LAB — SARS CORONAVIRUS 2 (TAT 6-24 HRS): SARS Coronavirus 2: NEGATIVE

## 2018-12-31 DIAGNOSIS — M329 Systemic lupus erythematosus, unspecified: Secondary | ICD-10-CM | POA: Diagnosis not present

## 2018-12-31 DIAGNOSIS — G500 Trigeminal neuralgia: Secondary | ICD-10-CM | POA: Diagnosis not present

## 2018-12-31 DIAGNOSIS — G43109 Migraine with aura, not intractable, without status migrainosus: Secondary | ICD-10-CM | POA: Diagnosis not present

## 2019-01-19 DIAGNOSIS — M9902 Segmental and somatic dysfunction of thoracic region: Secondary | ICD-10-CM | POA: Diagnosis not present

## 2019-01-19 DIAGNOSIS — M4014 Other secondary kyphosis, thoracic region: Secondary | ICD-10-CM | POA: Diagnosis not present

## 2019-01-19 DIAGNOSIS — M9901 Segmental and somatic dysfunction of cervical region: Secondary | ICD-10-CM | POA: Diagnosis not present

## 2019-01-19 DIAGNOSIS — M531 Cervicobrachial syndrome: Secondary | ICD-10-CM | POA: Diagnosis not present

## 2019-01-26 DIAGNOSIS — M531 Cervicobrachial syndrome: Secondary | ICD-10-CM | POA: Diagnosis not present

## 2019-01-26 DIAGNOSIS — M9901 Segmental and somatic dysfunction of cervical region: Secondary | ICD-10-CM | POA: Diagnosis not present

## 2019-01-26 DIAGNOSIS — M4014 Other secondary kyphosis, thoracic region: Secondary | ICD-10-CM | POA: Diagnosis not present

## 2019-01-26 DIAGNOSIS — M9902 Segmental and somatic dysfunction of thoracic region: Secondary | ICD-10-CM | POA: Diagnosis not present

## 2019-02-02 DIAGNOSIS — M329 Systemic lupus erythematosus, unspecified: Secondary | ICD-10-CM | POA: Diagnosis not present

## 2019-02-02 DIAGNOSIS — Z79899 Other long term (current) drug therapy: Secondary | ICD-10-CM | POA: Diagnosis not present

## 2019-02-03 ENCOUNTER — Telehealth: Payer: Self-pay

## 2019-02-03 NOTE — Telephone Encounter (Signed)
Copied from CRM [id number]. Topic: Appointment Scheduling - Scheduling Inquiry for Clinic >> Feb 03, 2019  3:13 PM Crist Infante wrote: Reason for CRM: pt used to see a dr that has left her practice.  Would like to to know if Dr B will accept her as a new pt? If ok please call

## 2019-02-04 NOTE — Telephone Encounter (Signed)
Please advise 

## 2019-02-04 NOTE — Telephone Encounter (Signed)
OK to schedule in next available NP spot.  Not adding additional new patients in January, though.

## 2019-02-05 DIAGNOSIS — M7062 Trochanteric bursitis, left hip: Secondary | ICD-10-CM | POA: Diagnosis not present

## 2019-02-05 DIAGNOSIS — M47816 Spondylosis without myelopathy or radiculopathy, lumbar region: Secondary | ICD-10-CM | POA: Diagnosis not present

## 2019-03-17 DIAGNOSIS — H02409 Unspecified ptosis of unspecified eyelid: Secondary | ICD-10-CM | POA: Diagnosis not present

## 2019-03-20 ENCOUNTER — Other Ambulatory Visit: Payer: Self-pay

## 2019-03-20 ENCOUNTER — Ambulatory Visit: Payer: BC Managed Care – PPO | Admitting: Family Medicine

## 2019-03-20 ENCOUNTER — Encounter: Payer: Self-pay | Admitting: Family Medicine

## 2019-03-20 VITALS — BP 136/79 | HR 89 | Temp 98.6°F | Ht 67.0 in | Wt 162.0 lb

## 2019-03-20 DIAGNOSIS — E559 Vitamin D deficiency, unspecified: Secondary | ICD-10-CM

## 2019-03-20 DIAGNOSIS — E538 Deficiency of other specified B group vitamins: Secondary | ICD-10-CM

## 2019-03-20 DIAGNOSIS — R7303 Prediabetes: Secondary | ICD-10-CM

## 2019-03-20 DIAGNOSIS — E782 Mixed hyperlipidemia: Secondary | ICD-10-CM

## 2019-03-20 DIAGNOSIS — G43909 Migraine, unspecified, not intractable, without status migrainosus: Secondary | ICD-10-CM

## 2019-03-20 DIAGNOSIS — M329 Systemic lupus erythematosus, unspecified: Secondary | ICD-10-CM

## 2019-03-20 DIAGNOSIS — Z1211 Encounter for screening for malignant neoplasm of colon: Secondary | ICD-10-CM

## 2019-03-20 DIAGNOSIS — F411 Generalized anxiety disorder: Secondary | ICD-10-CM

## 2019-03-20 DIAGNOSIS — E890 Postprocedural hypothyroidism: Secondary | ICD-10-CM

## 2019-03-20 MED ORDER — RIZATRIPTAN BENZOATE 10 MG PO TBDP: 10.0000 mg | ORAL_TABLET | ORAL | 3 refills | Status: DC | PRN

## 2019-03-20 MED ORDER — SUMATRIPTAN SUCCINATE 25 MG PO TABS: 25.0000 mg | ORAL_TABLET | ORAL | 3 refills | Status: DC | PRN

## 2019-03-20 MED ORDER — ALPRAZOLAM 0.5 MG PO TABS: 0.5000 mg | ORAL_TABLET | Freq: Two times a day (BID) | ORAL | 1 refills | Status: DC | PRN

## 2019-03-20 NOTE — Assessment & Plan Note (Signed)
Reviewed last A1c of 5.7 Reassured patient that prediabetes does not carry the risks of diabetes, but should serve as a warning of possible future risk of diabetes Encourage low-carb diet and regular exercise Recheck A1c today

## 2019-03-20 NOTE — Assessment & Plan Note (Signed)
Followed by rheumatology Continue current medications She is asymptomatic today

## 2019-03-20 NOTE — Assessment & Plan Note (Signed)
Reviewed last lipid panel Discussed lack of evidence for testing lipid size and other markers ASCVD 10-year risk is low at 2.3% No need for statin medication at this time Encourage diet low in saturated fat and regular exercise Repeat FLP and CMP

## 2019-03-20 NOTE — Assessment & Plan Note (Signed)
Previously diagnosed by previous PCP in 2012 Recheck vitamin D level Not currently on a supplement

## 2019-03-20 NOTE — Assessment & Plan Note (Signed)
History of Graves' disease, now with postoperative hypothyroidism Status post total thyroidectomy Followed by ophthalmology regularly Continue Synthroid at current dose Patient is asymptomatic and previously well controlled Recheck TSH level

## 2019-03-20 NOTE — Assessment & Plan Note (Signed)
Chronic and uncontrolled Recent worsening in the setting of new diagnosis of SLE within the last year, as well as the pandemic and work stress Discussed that if this is a daily problem and more of generalized anxiety, she would likely benefit more from a daily medication, like SSRI/SNRI Would avoid Lexapro as she has had SI previously with this Discussed that just because she had that previously, does not mean she would have it again, or with another medication She is very hesitant to take SSRI/SNRI due to his previous experience Encouraged counseling/therapy Continue Xanax, but cautioned her to use this only sparingly as we discussed the addictive nature Return precautions discussed Repeat GAD-7 and PHQ-9 at next visit

## 2019-03-20 NOTE — Assessment & Plan Note (Signed)
Previously diagnosed by previous PCP in 2012 She had some tremor at numbness and malaise when she was diagnosed She previously had B12 shots Not currently on a supplement Recheck vitamin B12 level

## 2019-03-20 NOTE — Assessment & Plan Note (Signed)
Chronic and intermittent Not frequent enough to need preventive therapy Continue sumatriptan/Maxalt as needed She knows not to take these together Avoid triggers Return precautions discussed

## 2019-03-20 NOTE — Progress Notes (Signed)
[name], Female    DOB: March 22, 1970, 49 y.o.   MRN: [id number] Visit Date: 03/20/2019  Today's Provider: Shirlee Latch, MD   Chief Complaint  Patient presents with  . Establish Care   Subjective:     Establish Care Caroline Mullen is a 49 y.o. female who presents today to establish care. She feels fairly well. She reports exercising some. She reports she is sleeping poorly. ----------------------------------------------------------------- Was diagnosed with SLE in 2020.  Has had a weight gain since her diagnosis.  Fell into worrying about COVID and SLE.  She has been eating for comfort.  She is wanting to get back on the train of healthy eating and exercise.  Taking Plaquenil and Leflunomide.  Sees Rheum at [facility].  H/o Graves disease Hyperthyroidism s/p thyroidectomy, now on Levothyroxine.  Asymptomatic.  Being sent to Optho specialist in [facility] by her eye doctor  Anxiety:  Has been high recently as well. Felt situational, but lately has been daily.  Difficulty with insomnia. Was Rx'd Xanax by Melody (CNM) - takes infrequently.  Tried Lexapro in the past and developed SI.  Migraines: Takes sumatriptan or Maxalt prn.  Occur about once every 2 months.  Have gotten better than they were previously.  Associated with N/V, photo/phonophobia  Allergic rhinitis: Zyrtec prn  H/o supracervical hysterectomy. Has some spotting intermittently.  No h/o abnormal pap smears  Review of Systems  Constitutional: Negative.   HENT: Negative.   Eyes: Negative.   Respiratory: Negative.   Cardiovascular: Negative.   Gastrointestinal: Negative.   Endocrine: Negative.   Genitourinary: Negative.   Musculoskeletal: Negative.   Skin: Negative.   Allergic/Immunologic: Negative.   Neurological: Negative.   Hematological: Negative.   Psychiatric/Behavioral: Negative.     Social History      She  reports that she quit smoking about 11 years ago. Her smoking use  included cigarettes. She has a 3.75 pack-year smoking history. She has never used smokeless tobacco. She reports that she does not drink alcohol or use drugs.       Social History   Socioeconomic History  . Marital status: Married    Spouse name: Not on file  . Number of children: 2  . Years of education: Not on file  . Highest education level: Not on file  Occupational History  . Not on file  Tobacco Use  . Smoking status: Former Smoker    Packs/day: 0.25    Years: 15.00    Pack years: 3.75    Types: Cigarettes    Quit date: 12/28/2007    Years since quitting: 11.2  . Smokeless tobacco: Never Used  . Tobacco comment: quit about 5 years ago  Substance and Sexual Activity  . Alcohol use: No  . Drug use: No  . Sexual activity: Yes    Partners: Male    Birth control/protection: Surgical  Other Topics Concern  . Not on file  Social History Narrative  . Not on file   Social Determinants of Health   Financial Resource Strain:   . Difficulty of Paying Living Expenses: Not on file  Food Insecurity:   . Worried About Programme researcher, broadcasting/film/video in the Last Year: Not on file  . Ran Out of Food in the Last Year: Not on file  Transportation Needs:   . Lack of Transportation (Medical): Not on file  . Lack of Transportation (Non-Medical): Not on file  Physical Activity:   . Days  of Exercise per Week: Not on file  . Minutes of Exercise per Session: Not on file  Stress:   . Feeling of Stress : Not on file  Social Connections:   . Frequency of Communication with Friends and Family: Not on file  . Frequency of Social Gatherings with Friends and Family: Not on file  . Attends Religious Services: Not on file  . Active Member of Clubs or Organizations: Not on file  . Attends Archivist Meetings: Not on file  . Marital Status: Not on file    Past Medical History:  Diagnosis Date  . Anxiety   . Connective tissue disease (HCC)    on Plaquenil    NO LONGER ON MEDS NO PROBLEMS X  6 YEARS  . Graves disease   . Lupus ([place])   . Migraine      Patient Active Problem List   Diagnosis Date Noted  . Prediabetes 03/20/2019  . Status post total thyroidectomy 08/08/2015  . Thyroid nodule 02/19/2011  . Syncope 12/05/2010  . B12 deficiency 11/17/2010  . Acute sinusitis 08/08/2010  . MIGRAINE, CLASSICAL 04/04/2010  . Avitaminosis D 02/25/2008  . HYPERLIPIDEMIA 11/22/2006  . GAD (generalized anxiety disorder) 04/22/2006  . HEMORRHOIDS 04/22/2006  . SLE (systemic lupus erythematosus) ([place]) 04/22/2006  . MYOFASCIAL PAIN SYNDROME 04/22/2006  . SYNCOPE 04/22/2006  . IRRITABLE BOWEL SYNDROME, HX OF 04/22/2006    Past Surgical History:  Procedure Laterality Date  . ABDOMINAL HYSTERECTOMY     fibroid uterine tumors supracervical  . CESAREAN SECTION    . HEMORRHOID BANDING    . KNEE ARTHROSCOPY    . SHOULDER SURGERY  1998  . THYROIDECTOMY N/A 08/08/2015   Procedure: THYROIDECTOMY;  Surgeon: Margaretha Sheffield, MD;  Location: [facility] ORS;  Service: ENT;  Laterality: N/A;  . TUBAL LIGATION      Family History        Family Status  Relation Name Status  . Father  Deceased at age 05/26/2002       skin CA Basal cell  . Mother  (Not Specified)  . MGM  Deceased  . MGF  Deceased  . Neg Hx  (Not Specified)        Her family history includes Cancer in her father; Diabetes in her mother; Hyperlipidemia in her maternal grandmother and mother; Hypertension in her maternal grandmother and mother; Lung cancer in her maternal grandfather. There is no history of Breast cancer or Colon cancer.      Allergies  Allergen Reactions  . Morphine     REACTION: rash     Current Outpatient Medications:  .  ALPRAZolam (XANAX) 0.5 MG tablet, Take 1 tablet (0.5 mg total) by mouth 2 (two) times daily as needed. for anxiety, Disp: 15 tablet, Rfl: 1 .  cetirizine (ZYRTEC) 10 MG chewable tablet, Chew 10 mg by mouth daily., Disp: , Rfl:  .  hydroxychloroquine (PLAQUENIL) 200 MG tablet, TAKE 1 AND 1/2  TABLETS(300 MG) BY MOUTH DAILY, Disp: 45 tablet, Rfl: 2 .  ibuprofen (ADVIL,MOTRIN) 200 MG tablet, Take 600 mg by mouth every 6 (six) hours as needed for moderate pain. , Disp: , Rfl:  .  leflunomide (ARAVA) 10 MG tablet, Take 10 mg by mouth daily., Disp: , Rfl:  .  levothyroxine (SYNTHROID, LEVOTHROID) 125 MCG tablet, Take 125 mcg by mouth daily before breakfast., Disp: , Rfl:  .  Multiple Vitamin (MULTIVITAMIN) capsule, Take by mouth., Disp: , Rfl:  .  rizatriptan (MAXALT-MLT) 10 MG disintegrating  tablet, Take 1 tablet (10 mg total) by mouth as needed for migraine. May repeat in 2 hours if needed, Disp: 10 tablet, Rfl: 3 .  SUMAtriptan (IMITREX) 25 MG tablet, Take 1 tablet (25 mg total) by mouth every 2 (two) hours as needed for migraine., Disp: 10 tablet, Rfl: 3 .  Omega-3 Fatty Acids (FISH OIL) 1000 MG CAPS, Take by mouth., Disp: , Rfl:    Patient Care Team: Erasmo Downer, MD as PCP - General (Family Medicine)    Objective:    Vitals: BP 136/79 (BP Location: Left Arm, Patient Position: Sitting, Cuff Size: Normal)   Pulse 89   Temp 98.6 F (37 C) (Temporal)   Ht 5\' 7"  (1.702 m)   Wt 162 lb (73.5 kg)   BMI 25.37 kg/m    Vitals:   03/20/19 1407  BP: 136/79  Pulse: 89  Temp: 98.6 F (37 C)  TempSrc: Temporal  Weight: 162 lb (73.5 kg)  Height: 5\' 7"  (1.702 m)     Physical Exam Vitals reviewed.  Constitutional:      General: She is not in acute distress.    Appearance: Normal appearance. She is well-developed. She is not diaphoretic.  HENT:     Head: Normocephalic and atraumatic.     Right Ear: Tympanic membrane, ear canal and external ear normal.     Left Ear: Tympanic membrane, ear canal and external ear normal.  Eyes:     General: No scleral icterus.    Conjunctiva/sclera: Conjunctivae normal.     Pupils: Pupils are equal, round, and reactive to light.  Neck:     Thyroid: No thyromegaly.  Cardiovascular:     Rate and Rhythm: Normal rate and regular rhythm.       Pulses: Normal pulses.     Heart sounds: Normal heart sounds. No murmur.  Pulmonary:     Effort: Pulmonary effort is normal. No respiratory distress.     Breath sounds: Normal breath sounds. No wheezing or rales.  Abdominal:     General: There is no distension.     Palpations: Abdomen is soft.     Tenderness: There is no abdominal tenderness.  Musculoskeletal:        General: No deformity.     Cervical back: Neck supple.     Right lower leg: No edema.     Left lower leg: No edema.  Lymphadenopathy:     Cervical: No cervical adenopathy.  Skin:    General: Skin is warm and dry.     Capillary Refill: Capillary refill takes less than 2 seconds.     Findings: No rash.  Neurological:     Mental Status: She is alert and oriented to person, place, and time. Mental status is at baseline.  Psychiatric:        Mood and Affect: Affect normal. Mood is anxious.        Speech: Speech normal.        Behavior: Behavior normal.        Thought Content: Thought content normal. Thought content does not include homicidal or suicidal ideation.      Depression Screen No flowsheet data found.  No flowsheet data found.       Assessment & Plan:     Establish Care  Exercise Activities and Dietary recommendations Goals   None     Immunization History  Administered Date(s) Administered  . Influenza Whole 10/30/2007, 03/01/2009  . Td 09/29/2004    Health Maintenance  Topic  Date Due  . HIV Screening  01/17/1984  . TETANUS/TDAP  09/30/2014  . COLONOSCOPY  01/17/2019  . MAMMOGRAM  10/16/2020  . PAP SMEAR-Modifier  10/27/2021  . INFLUENZA VACCINE  Discontinued     Discussed health benefits of physical activity, and encouraged her to engage in regular exercise appropriate for her age and condition.    Reviewed last notes from rheumatology at [facility].  Reviewed last notes from GYN, including labs from  09/2018. --------------------------------------------------------------------  Problem List Items Addressed This Visit      Cardiovascular and Mediastinum   Migraine    Chronic and intermittent Not frequent enough to need preventive therapy Continue sumatriptan/Maxalt as needed She knows not to take these together Avoid triggers Return precautions discussed      Relevant Medications   rizatriptan (MAXALT-MLT) 10 MG disintegrating tablet   SUMAtriptan (IMITREX) 25 MG tablet     Endocrine   Postoperative hypothyroidism    History of Graves' disease, now with postoperative hypothyroidism Status post total thyroidectomy Followed by ophthalmology regularly Continue Synthroid at current dose Patient is asymptomatic and previously well controlled Recheck TSH level      Relevant Orders   TSH     Other   Avitaminosis D    Previously diagnosed by previous PCP in 2012 Recheck vitamin D level Not currently on a supplement      Relevant Orders   VITAMIN D 25 Hydroxy (Vit-D Deficiency, Fractures)   HYPERLIPIDEMIA    Reviewed last lipid panel Discussed lack of evidence for testing lipid size and other markers ASCVD 10-year risk is low at 2.3% No need for statin medication at this time Encourage diet low in saturated fat and regular exercise Repeat FLP and CMP      Relevant Orders   Lipid panel   Comprehensive metabolic panel   GAD (generalized anxiety disorder)    Chronic and uncontrolled Recent worsening in the setting of new diagnosis of SLE within the last year, as well as the pandemic and work stress Discussed that if this is a daily problem and more of generalized anxiety, she would likely benefit more from a daily medication, like SSRI/SNRI Would avoid Lexapro as she has had SI previously with this Discussed that just because she had that previously, does not mean she would have it again, or with another medication She is very hesitant to take SSRI/SNRI due to his  previous experience Encouraged counseling/therapy Continue Xanax, but cautioned her to use this only sparingly as we discussed the addictive nature Return precautions discussed Repeat GAD-7 and PHQ-9 at next visit      Relevant Medications   ALPRAZolam (XANAX) 0.5 MG tablet   SLE (systemic lupus erythematosus) (HCC)    Followed by rheumatology Continue current medications She is asymptomatic today      B12 deficiency    Previously diagnosed by previous PCP in 2012 She had some tremor at numbness and malaise when she was diagnosed She previously had B12 shots Not currently on a supplement Recheck vitamin B12 level      Relevant Orders   B12   Prediabetes - Primary    Reviewed last A1c of 5.7 Reassured patient that prediabetes does not carry the risks of diabetes, but should serve as a warning of possible future risk of diabetes Encourage low-carb diet and regular exercise Recheck A1c today      Relevant Orders   Hemoglobin A1c    Other Visit Diagnoses    Screen for colon cancer  Relevant Orders   Cologuard       Return in about 6 months (around 09/17/2019) for CPE.  Total time spent on today's visit was greater than 60 minutes, including both face-to-face time and nonface-to-face time personally spent on review of chart (labs and imaging), discussing labs and goals, discussing further work-up, treatment options, referrals to specialist if needed, reviewing outside records of pertinent, answering patient's questions, and coordinating care.    The entirety of the information documented in the History of Present Illness, Review of Systems and Physical Exam were personally obtained by me. Portions of this information were initially documented by Kavin Leech, CMA and reviewed by me for thoroughness and accuracy.    Taraoluwa Thakur, Marzella Schlein, MD MPH [facility]

## 2019-03-26 DIAGNOSIS — E890 Postprocedural hypothyroidism: Secondary | ICD-10-CM | POA: Diagnosis not present

## 2019-03-26 DIAGNOSIS — E559 Vitamin D deficiency, unspecified: Secondary | ICD-10-CM | POA: Diagnosis not present

## 2019-03-26 DIAGNOSIS — E782 Mixed hyperlipidemia: Secondary | ICD-10-CM | POA: Diagnosis not present

## 2019-03-26 DIAGNOSIS — R7303 Prediabetes: Secondary | ICD-10-CM | POA: Diagnosis not present

## 2019-03-26 DIAGNOSIS — E538 Deficiency of other specified B group vitamins: Secondary | ICD-10-CM | POA: Diagnosis not present

## 2019-03-27 LAB — COMPREHENSIVE METABOLIC PANEL
ALT: 15 IU/L (ref 0–32)
AST: 17 IU/L (ref 0–40)
Albumin/Globulin Ratio: 2 (ref 1.2–2.2)
Albumin: 4.3 g/dL (ref 3.8–4.8)
Alkaline Phosphatase: 41 IU/L (ref 39–117)
BUN/Creatinine Ratio: 18 (ref 9–23)
BUN: 14 mg/dL (ref 6–24)
Bilirubin Total: 0.8 mg/dL (ref 0.0–1.2)
CO2: 22 mmol/L (ref 20–29)
Calcium: 8.6 mg/dL — ABNORMAL LOW (ref 8.7–10.2)
Chloride: 104 mmol/L (ref 96–106)
Creatinine, Ser: 0.77 mg/dL (ref 0.57–1.00)
GFR calc Af Amer: 104 mL/min/{1.73_m2} (ref >59)
GFR calc non Af Amer: 90 mL/min/{1.73_m2} (ref >59)
Globulin, Total: 2.1 g/dL (ref 1.5–4.5)
Glucose: 95 mg/dL (ref 65–99)
Potassium: 4 mmol/L (ref 3.5–5.2)
Sodium: 140 mmol/L (ref 134–144)
Total Protein: 6.4 g/dL (ref 6.0–8.5)

## 2019-03-27 LAB — HEMOGLOBIN A1C
Est. average glucose Bld gHb Est-mCnc: 120 mg/dL
Hgb A1c MFr Bld: 5.8 % — ABNORMAL HIGH (ref 4.8–5.6)

## 2019-03-27 LAB — LIPID PANEL
Chol/HDL Ratio: 5.9 ratio — ABNORMAL HIGH (ref 0.0–4.4)
Cholesterol, Total: 176 mg/dL (ref 100–199)
HDL: 30 mg/dL — ABNORMAL LOW (ref >39)
LDL Chol Calc (NIH): 121 mg/dL — ABNORMAL HIGH (ref 0–99)
Triglycerides: 140 mg/dL (ref 0–149)
VLDL Cholesterol Cal: 25 mg/dL (ref 5–40)

## 2019-03-27 LAB — TSH: TSH: 0.713 u[IU]/mL (ref 0.450–4.500)

## 2019-03-27 LAB — VITAMIN B12: Vitamin B-12: 730 pg/mL (ref 232–1245)

## 2019-03-27 LAB — VITAMIN D 25 HYDROXY (VIT D DEFICIENCY, FRACTURES): Vit D, 25-Hydroxy: 27.4 ng/mL — ABNORMAL LOW (ref 30.0–100.0)

## 2019-03-31 ENCOUNTER — Telehealth: Payer: Self-pay

## 2019-03-31 NOTE — Telephone Encounter (Signed)
-----   Message from Erasmo Downer, MD sent at 03/31/2019  8:13 AM EST ----- Normal labs, except vitamin D level slightly low (recommend daily supplementation with 2000 units vitamin D3), A1c remains in the prediabetic range (recommend low-carb diet and regular exercise), cholesterol remains slightly high, though better than it was last year. recommend diet low in saturated fat and regular exercise - 30 min at least 5 times per week

## 2019-03-31 NOTE — Telephone Encounter (Signed)
Comment seen by patient Caroline Mullen on 03/31/2019 10:48 AM EST. Patient verbalizes understanding and is in agreement with treatment plan.

## 2019-04-07 DIAGNOSIS — Z1211 Encounter for screening for malignant neoplasm of colon: Secondary | ICD-10-CM | POA: Diagnosis not present

## 2019-04-12 LAB — COLOGUARD: COLOGUARD: NEGATIVE

## 2019-04-15 ENCOUNTER — Telehealth: Payer: Self-pay

## 2019-04-15 LAB — COLOGUARD: Cologuard: NEGATIVE

## 2019-04-15 NOTE — Telephone Encounter (Signed)
-----   Message from Erasmo Downer, MD sent at 04/15/2019  9:32 AM EDT ----- Negative Cologuard.  Next colon cancer screening in 3 years.

## 2019-04-15 NOTE — Telephone Encounter (Signed)
Pt advised.   Thanks,   -Tag Wurtz  

## 2019-04-20 DIAGNOSIS — H02402 Unspecified ptosis of left eyelid: Secondary | ICD-10-CM | POA: Diagnosis not present

## 2019-04-20 DIAGNOSIS — J3489 Other specified disorders of nose and nasal sinuses: Secondary | ICD-10-CM | POA: Diagnosis not present

## 2019-04-20 DIAGNOSIS — H02831 Dermatochalasis of right upper eyelid: Secondary | ICD-10-CM | POA: Diagnosis not present

## 2019-04-20 DIAGNOSIS — L578 Other skin changes due to chronic exposure to nonionizing radiation: Secondary | ICD-10-CM | POA: Diagnosis not present

## 2019-04-30 ENCOUNTER — Ambulatory Visit: Payer: BC Managed Care – PPO | Admitting: Podiatry

## 2019-04-30 ENCOUNTER — Encounter: Payer: Self-pay | Admitting: Podiatry

## 2019-04-30 ENCOUNTER — Other Ambulatory Visit: Payer: Self-pay

## 2019-04-30 DIAGNOSIS — M79674 Pain in right toe(s): Secondary | ICD-10-CM | POA: Diagnosis not present

## 2019-04-30 DIAGNOSIS — W450XXA Nail entering through skin, initial encounter: Secondary | ICD-10-CM | POA: Diagnosis not present

## 2019-04-30 NOTE — Progress Notes (Signed)
This patient presents the office with chief complaint of a painful right toenail on her right big toe.  She says that she pulled her nail off earlier this morning and believes it is still attached at the base of the nail.  She says she has been walking and it is now throbbing.  She states she has had previous surgery and injury to this right big toenail.  Patient has washed this nail and bandaged the nail prior to her appointment.  She presents the office today for an evaluation and treatment of this injured toenail.  Vascular  Dorsalis pedis and posterior tibial pulses are palpable  B/L.  Capillary return  WNL.  Temperature gradient is  WNL.  Skin turgor  WNL  Sensorium  Senn Weinstein monofilament wire  WNL. Normal tactile sensation.  Nail Exam  Her right hallux nail plate is unattached to her nail bed.  The nail appears attached at the proximal nail fold.    Orthopedic  Exam  Muscle tone and muscle strength  WNL.  No limitations of motion feet  B/L.  No crepitus or joint effusion noted.  Foot type is unremarkable and digits show no abnormalities.  Bony prominences are unremarkable.  Skin  No open lesions.  Normal skin texture and turgor.  Nail Injury right hallux.  IE>  Removal right nail plate.  This patient previously had nail surgery and chose to forego anesthesia and allow me to remove the nail without anesthesia.  Nail plate was removed Neosporin dry sterile dressing was applied.  Patient was told to peroxide the site at home.  Call the office for further evaluation and treatment as needed   Helane Gunther DPM

## 2019-05-05 ENCOUNTER — Encounter: Payer: Self-pay | Admitting: Family Medicine

## 2019-05-12 NOTE — Telephone Encounter (Signed)
OK to place referral

## 2019-05-13 ENCOUNTER — Other Ambulatory Visit: Payer: Self-pay

## 2019-05-13 DIAGNOSIS — E0500 Thyrotoxicosis with diffuse goiter without thyrotoxic crisis or storm: Secondary | ICD-10-CM

## 2019-05-25 DIAGNOSIS — Z01812 Encounter for preprocedural laboratory examination: Secondary | ICD-10-CM | POA: Diagnosis not present

## 2019-05-25 DIAGNOSIS — Z20822 Contact with and (suspected) exposure to covid-19: Secondary | ICD-10-CM | POA: Diagnosis not present

## 2019-05-28 DIAGNOSIS — Z885 Allergy status to narcotic agent status: Secondary | ICD-10-CM | POA: Diagnosis not present

## 2019-05-28 DIAGNOSIS — E079 Disorder of thyroid, unspecified: Secondary | ICD-10-CM | POA: Diagnosis not present

## 2019-05-28 DIAGNOSIS — Z87891 Personal history of nicotine dependence: Secondary | ICD-10-CM | POA: Diagnosis not present

## 2019-05-28 DIAGNOSIS — L987 Excessive and redundant skin and subcutaneous tissue: Secondary | ICD-10-CM | POA: Diagnosis not present

## 2019-05-28 DIAGNOSIS — H02831 Dermatochalasis of right upper eyelid: Secondary | ICD-10-CM | POA: Diagnosis not present

## 2019-05-28 DIAGNOSIS — H02834 Dermatochalasis of left upper eyelid: Secondary | ICD-10-CM | POA: Diagnosis not present

## 2019-05-28 DIAGNOSIS — H02402 Unspecified ptosis of left eyelid: Secondary | ICD-10-CM | POA: Diagnosis not present

## 2019-06-12 DIAGNOSIS — D2262 Melanocytic nevi of left upper limb, including shoulder: Secondary | ICD-10-CM | POA: Diagnosis not present

## 2019-06-12 DIAGNOSIS — D225 Melanocytic nevi of trunk: Secondary | ICD-10-CM | POA: Diagnosis not present

## 2019-06-12 DIAGNOSIS — D2272 Melanocytic nevi of left lower limb, including hip: Secondary | ICD-10-CM | POA: Diagnosis not present

## 2019-06-12 DIAGNOSIS — D2261 Melanocytic nevi of right upper limb, including shoulder: Secondary | ICD-10-CM | POA: Diagnosis not present

## 2019-06-23 DIAGNOSIS — M329 Systemic lupus erythematosus, unspecified: Secondary | ICD-10-CM | POA: Diagnosis not present

## 2019-06-23 DIAGNOSIS — Z79899 Other long term (current) drug therapy: Secondary | ICD-10-CM | POA: Diagnosis not present

## 2019-08-28 DIAGNOSIS — D485 Neoplasm of uncertain behavior of skin: Secondary | ICD-10-CM | POA: Diagnosis not present

## 2019-10-30 ENCOUNTER — Ambulatory Visit: Payer: BC Managed Care – PPO | Admitting: Family Medicine

## 2019-11-02 ENCOUNTER — Telehealth: Payer: Self-pay | Admitting: Family Medicine

## 2019-11-02 NOTE — Telephone Encounter (Signed)
Patient had a CPE scheduled under an office visit. Patient is wanting to reschedule the appt her preferred day 11/03/05/19 or the week of December 20,21 Please advise CB- [phone]

## 2019-11-03 NOTE — Telephone Encounter (Signed)
Spoke w/ patient.  appt made for 01/18/20 at 1:00 p.m.

## 2019-11-09 ENCOUNTER — Ambulatory Visit: Payer: BC Managed Care – PPO | Admitting: Family Medicine

## 2019-12-21 ENCOUNTER — Telehealth (INDEPENDENT_AMBULATORY_CARE_PROVIDER_SITE_OTHER): Payer: Managed Care, Other (non HMO) | Admitting: Physician Assistant

## 2019-12-21 ENCOUNTER — Encounter: Payer: Self-pay | Admitting: Physician Assistant

## 2019-12-21 DIAGNOSIS — J0140 Acute pansinusitis, unspecified: Secondary | ICD-10-CM | POA: Diagnosis not present

## 2019-12-21 DIAGNOSIS — T3695XA Adverse effect of unspecified systemic antibiotic, initial encounter: Secondary | ICD-10-CM

## 2019-12-21 DIAGNOSIS — B379 Candidiasis, unspecified: Secondary | ICD-10-CM | POA: Diagnosis not present

## 2019-12-21 MED ORDER — FLUCONAZOLE 150 MG PO TABS: 150.0000 mg | ORAL_TABLET | Freq: Once | ORAL | 0 refills | Status: AC

## 2019-12-21 MED ORDER — AZITHROMYCIN 250 MG PO TABS: ORAL_TABLET | ORAL | 0 refills | Status: DC

## 2019-12-21 NOTE — Progress Notes (Signed)
MyChart Video Visit    Virtual Visit via Video Note   This visit type was conducted due to national recommendations for restrictions regarding the COVID-19 Pandemic (e.g. social distancing) in an effort to limit this patient's exposure and mitigate transmission in our community. This patient is at least at moderate risk for complications without adequate follow up. This format is felt to be most appropriate for this patient at this time. Physical exam was limited by quality of the video and audio technology used for the visit.   Patient location: Home Provider location: [facility]  I discussed the limitations of evaluation and management by telemedicine and the availability of in person appointments. The patient expressed understanding and agreed to proceed.  [name]   DOB: 1970/12/03   49 y.o. Female  MRN: [id number] Visit Date: 12/21/2019  Today's healthcare provider: Margaretann Loveless, PA-C   No chief complaint on file.  Subjective    URI  This is a new problem. The current episode started 1 to 4 weeks ago (3 weeks). The problem has been gradually worsening. There has been no fever. Associated symptoms include congestion, coughing ("started two days ago"), ear pain, headaches, rhinorrhea, sinus pain, sneezing and a sore throat. Pertinent negatives include no wheezing. Associated symptoms comments: aching. Treatments tried: Cold and Sinus med, Vitamin C. The treatment provided no relief.     Patient Active Problem List   Diagnosis Date Noted  . Nail, injury by, initial encounter 04/30/2019  . Prediabetes 03/20/2019  . Postoperative hypothyroidism 08/08/2015  . B12 deficiency 11/17/2010  . Migraine 04/04/2010  . Avitaminosis D 02/25/2008  . HYPERLIPIDEMIA 11/22/2006  . GAD (generalized anxiety disorder) 04/22/2006  . SLE (systemic lupus erythematosus) (HCC) 04/22/2006      Medications: Outpatient Medications Prior to Visit  Medication Sig  . ALPRAZolam  (XANAX) 0.5 MG tablet Take 1 tablet (0.5 mg total) by mouth 2 (two) times daily as needed. for anxiety  . hydroxychloroquine (PLAQUENIL) 200 MG tablet TAKE 1 AND 1/2 TABLETS(300 MG) BY MOUTH DAILY  . ibuprofen (ADVIL,MOTRIN) 200 MG tablet Take 600 mg by mouth every 6 (six) hours as needed for moderate pain.   Marland Kitchen leflunomide (ARAVA) 10 MG tablet TAKE 1 TABLET(10 MG) BY MOUTH DAILY  . levothyroxine (SYNTHROID, LEVOTHROID) 125 MCG tablet Take 125 mcg by mouth daily before breakfast.  . Multiple Vitamin (MULTIVITAMIN) capsule Take by mouth.  . Omega-3 Fatty Acids (FISH OIL) 1000 MG CAPS Take by mouth.  . rizatriptan (MAXALT-MLT) 10 MG disintegrating tablet Take 1 tablet (10 mg total) by mouth as needed for migraine. May repeat in 2 hours if needed  . SUMAtriptan (IMITREX) 25 MG tablet Take 1 tablet (25 mg total) by mouth every 2 (two) hours as needed for migraine.  . cetirizine (ZYRTEC) 10 MG chewable tablet Chew 10 mg by mouth daily. (Patient not taking: Reported on 12/21/2019)  . fexofenadine (ALLEGRA) 180 MG tablet Take by mouth. (Patient not taking: Reported on 12/21/2019)   No facility-administered medications prior to visit.    Review of Systems  HENT: Positive for congestion, ear pain, postnasal drip, rhinorrhea, sinus pressure, sinus pain, sneezing and sore throat.   Respiratory: Positive for cough ("started two days ago"). Negative for chest tightness, shortness of breath and wheezing.   Neurological: Positive for headaches.    Last CBC Lab Results  Component Value Date   WBC 7.5 12/22/2018   HGB 14.3 12/22/2018   HCT 41.0 12/22/2018   MCV 87.2  12/22/2018   MCH 30.4 12/22/2018   RDW 12.4 12/22/2018   PLT 263 12/22/2018   Last metabolic panel Lab Results  Component Value Date   GLUCOSE 95 03/26/2019   NA 140 03/26/2019   K 4.0 03/26/2019   CL 104 03/26/2019   CO2 22 03/26/2019   BUN 14 03/26/2019   CREATININE 0.77 03/26/2019   GFRNONAA 90 03/26/2019   GFRAA 104  03/26/2019   CALCIUM 8.6 (L) 03/26/2019   PROT 6.4 03/26/2019   ALBUMIN 4.3 03/26/2019   LABGLOB 2.1 03/26/2019   AGRATIO 2.0 03/26/2019   BILITOT 0.8 03/26/2019   ALKPHOS 41 03/26/2019   AST 17 03/26/2019   ALT 15 03/26/2019   ANIONGAP 8 12/22/2018      Objective    There were no vitals taken for this visit. BP Readings from Last 3 Encounters:  04/30/19 (!) 150/90  03/20/19 136/79  12/22/18 (!) 144/72   Wt Readings from Last 3 Encounters:  03/20/19 162 lb (73.5 kg)  12/22/18 158 lb (71.7 kg)  10/28/18 155 lb 8 oz (70.5 kg)      Physical Exam     Assessment & Plan     1. Acute non-recurrent pansinusitis Worsening symptoms that have not responded to OTC medications. Will give Zpak as below. Continue allergy medications. Stay well hydrated and get plenty of rest. Call if no symptom improvement or if symptoms worsen. - azithromycin (ZITHROMAX) 250 MG tablet; Take 2 tablets PO on day one, and one tablet PO daily thereafter until completed.  Dispense: 6 tablet; Refill: 0  2. Antibiotic-induced yeast infection Gets yeast infections with antibiotics. Diflucan given as below.  - fluconazole (DIFLUCAN) 150 MG tablet; Take 1 tablet (150 mg total) by mouth once for 1 dose. May repeat in 48-72 hrs if needed  Dispense: 2 tablet; Refill: 0   No follow-ups on file.     I discussed the assessment and treatment plan with the patient. The patient was provided an opportunity to ask questions and all were answered. The patient agreed with the plan and demonstrated an understanding of the instructions.   The patient was advised to call back or seek an in-person evaluation if the symptoms worsen or if the condition fails to improve as anticipated.  I provided 10 minutes of face-to-face time during this encounter via MyChart Video enabled encounter.  Delmer Islam, PA-C, have reviewed all documentation for this visit. The documentation on 12/21/19 for the exam, diagnosis,  procedures, and orders are all accurate and complete.  Reine Just [facility] [phone] (phone) [phone] (fax)  [facility]

## 2019-12-21 NOTE — Patient Instructions (Signed)

## 2020-01-13 ENCOUNTER — Other Ambulatory Visit: Payer: Self-pay

## 2020-01-13 NOTE — Telephone Encounter (Signed)
Copied from CRM [id number]. Topic: General - Other >> Jan 13, 2020  1:19 PM Lyn Hollingshead D wrote: PT call to cancel her appt CPE for 01/18/20 she tested positive for coivd / need an appt for the last week of December / please advise

## 2020-01-14 MED ORDER — LEVOTHYROXINE SODIUM 125 MCG PO TABS: 125.0000 ug | ORAL_TABLET | Freq: Every day | ORAL | 0 refills | Status: DC

## 2020-01-14 NOTE — Telephone Encounter (Signed)
I'm not sure I understand. Looks like she rescheduled for January.  I am out of the office the last week of December.  If there is some reason that CPE has to be done before the end of the year, it is possible that another provider could do it.

## 2020-01-14 NOTE — Telephone Encounter (Signed)
CPE is scheduled she was just wanting a refill for her levothyroxine.

## 2020-01-18 ENCOUNTER — Encounter: Payer: Self-pay | Admitting: Family Medicine

## 2020-02-08 ENCOUNTER — Other Ambulatory Visit: Payer: Self-pay

## 2020-02-08 ENCOUNTER — Ambulatory Visit (INDEPENDENT_AMBULATORY_CARE_PROVIDER_SITE_OTHER): Payer: Managed Care, Other (non HMO) | Admitting: Family Medicine

## 2020-02-08 ENCOUNTER — Other Ambulatory Visit (HOSPITAL_COMMUNITY)
Admission: RE | Admit: 2020-02-08 | Discharge: 2020-02-08 | Disposition: A | Discharge status: Home / Self Care | Payer: Managed Care, Other (non HMO) | Source: Ambulatory Visit | Attending: Family Medicine | Admitting: Family Medicine

## 2020-02-08 ENCOUNTER — Encounter: Payer: Self-pay | Admitting: Family Medicine

## 2020-02-08 VITALS — BP 108/72 | HR 77 | Temp 98.3°F | Resp 16 | Ht 67.0 in | Wt 187.0 lb

## 2020-02-08 DIAGNOSIS — E559 Vitamin D deficiency, unspecified: Secondary | ICD-10-CM

## 2020-02-08 DIAGNOSIS — E782 Mixed hyperlipidemia: Secondary | ICD-10-CM | POA: Diagnosis not present

## 2020-02-08 DIAGNOSIS — M329 Systemic lupus erythematosus, unspecified: Secondary | ICD-10-CM

## 2020-02-08 DIAGNOSIS — E890 Postprocedural hypothyroidism: Secondary | ICD-10-CM | POA: Diagnosis not present

## 2020-02-08 DIAGNOSIS — R7303 Prediabetes: Secondary | ICD-10-CM

## 2020-02-08 DIAGNOSIS — F411 Generalized anxiety disorder: Secondary | ICD-10-CM

## 2020-02-08 DIAGNOSIS — G43909 Migraine, unspecified, not intractable, without status migrainosus: Secondary | ICD-10-CM

## 2020-02-08 DIAGNOSIS — Z0000 Encounter for general adult medical examination without abnormal findings: Secondary | ICD-10-CM

## 2020-02-08 DIAGNOSIS — R87810 Cervical high risk human papillomavirus (HPV) DNA test positive: Secondary | ICD-10-CM | POA: Insufficient documentation

## 2020-02-08 DIAGNOSIS — E538 Deficiency of other specified B group vitamins: Secondary | ICD-10-CM

## 2020-02-08 DIAGNOSIS — Z1231 Encounter for screening mammogram for malignant neoplasm of breast: Secondary | ICD-10-CM

## 2020-02-08 MED ORDER — RIZATRIPTAN BENZOATE 10 MG PO TBDP: 10.0000 mg | ORAL_TABLET | ORAL | 3 refills | Status: DC | PRN

## 2020-02-08 MED ORDER — ALPRAZOLAM 0.5 MG PO TABS: 0.5000 mg | ORAL_TABLET | Freq: Two times a day (BID) | ORAL | 1 refills | Status: DC | PRN

## 2020-02-08 MED ORDER — LEVOTHYROXINE SODIUM 125 MCG PO TABS: 125.0000 ug | ORAL_TABLET | Freq: Every day | ORAL | 3 refills | Status: DC

## 2020-02-08 MED ORDER — SUMATRIPTAN SUCCINATE 25 MG PO TABS: 25.0000 mg | ORAL_TABLET | ORAL | 3 refills | Status: AC | PRN

## 2020-02-08 NOTE — Progress Notes (Signed)
Complete physical exam   [name]   DOB: December 07, 1970   50 y.o. Female  MRN: [id number] Visit Date: 02/08/2020  Today's healthcare provider: Shirlee Latch, MD   Chief Complaint  Patient presents with  . Annual Exam   Subjective    Caroline Mullen is a 50 y.o. female who presents today for a complete physical exam.  She reports consuming a general diet. The patient does not participate in regular exercise at present. She generally feels well. She reports sleeping poorly.  Trying melatonin.   She does have additional problems to discuss today.    HPI  10/28/2018 Pap/HPV-positive 10/17/2018 Mammogram-BI-RADS 1   Past Medical History:  Diagnosis Date  . Anxiety   . Connective tissue disease (HCC)    on Plaquenil    NO LONGER ON MEDS NO PROBLEMS X 6 YEARS  . Graves disease   . IRRITABLE BOWEL SYNDROME, HX OF 04/22/2006   Qualifier: History of  By: Milinda Antis MD, Colon Flattery   . Lupus (HCC)   . Migraine   . Thyroid nodule 02/19/2011   Past Surgical History:  Procedure Laterality Date  . ABDOMINAL HYSTERECTOMY     fibroid uterine tumors supracervical  . CESAREAN SECTION    . HEMORRHOID BANDING    . KNEE ARTHROSCOPY    . SHOULDER SURGERY  1998  . THYROIDECTOMY N/A 08/08/2015   Procedure: THYROIDECTOMY;  Surgeon: Vernie Murders, MD;  Location: [facility] ORS;  Service: ENT;  Laterality: N/A;  . TUBAL LIGATION     Social History   Socioeconomic History  . Marital status: Married    Spouse name: Not on file  . Number of children: 2  . Years of education: Not on file  . Highest education level: Not on file  Occupational History  . Not on file  Tobacco Use  . Smoking status: Former Smoker    Packs/day: 0.25    Years: 15.00    Pack years: 3.75    Types: Cigarettes    Quit date: 12/28/2007    Years since quitting: 12.1  . Smokeless tobacco: Never Used  . Tobacco comment: quit about 5 years ago  Vaping Use  . Vaping Use: Never used  Substance and Sexual  Activity  . Alcohol use: No  . Drug use: No  . Sexual activity: Yes    Partners: Male    Birth control/protection: Surgical  Other Topics Concern  . Not on file  Social History Narrative  . Not on file   Social Determinants of Health   Financial Resource Strain: Not on file  Food Insecurity: Not on file  Transportation Needs: Not on file  Physical Activity: Not on file  Stress: Not on file  Social Connections: Not on file  Intimate Partner Violence: Not on file   Family Status  Relation Name Status  . Father  Deceased at age 05/27/2002       skin CA Basal cell  . Mother  (Not Specified)  . MGM  Deceased  . MGF  Deceased  . Neg Hx  (Not Specified)   Family History  Problem Relation Age of Onset  . Cancer Father        skin CA Basal cell died 2002-05-27  . Diabetes Mother   . Hypertension Mother   . Hyperlipidemia Mother   . Hypertension Maternal Grandmother   . Hyperlipidemia Maternal Grandmother   . Lung cancer Maternal Grandfather   . Breast cancer Neg Hx   .  Colon cancer Neg Hx    Allergies  Allergen Reactions  . Morphine     REACTION: rash    Patient Care Team: Erasmo DownerBacigalupo, Dezarae Mcclaran M, MD as PCP - General (Family Medicine)   Medications: Outpatient Medications Prior to Visit  Medication Sig  . ALPRAZolam (XANAX) 0.5 MG tablet Take 1 tablet (0.5 mg total) by mouth 2 (two) times daily as needed. for anxiety  . hydroxychloroquine (PLAQUENIL) 200 MG tablet TAKE 1 AND 1/2 TABLETS(300 MG) BY MOUTH DAILY  . ibuprofen (ADVIL,MOTRIN) 200 MG tablet Take 600 mg by mouth every 6 (six) hours as needed for moderate pain.  Marland Kitchen. leflunomide (ARAVA) 10 MG tablet TAKE 1 TABLET(10 MG) BY MOUTH DAILY  . levothyroxine (SYNTHROID) 125 MCG tablet Take 1 tablet (125 mcg total) by mouth daily before breakfast.  . melatonin 5 MG TABS Take 5 mg by mouth at bedtime.  . Multiple Vitamin (MULTIVITAMIN) capsule Take by mouth.  . Omega-3 Fatty Acids (FISH OIL) 1000 MG CAPS Take by mouth.  .  rizatriptan (MAXALT-MLT) 10 MG disintegrating tablet Take 1 tablet (10 mg total) by mouth as needed for migraine. May repeat in 2 hours if needed  . SUMAtriptan (IMITREX) 25 MG tablet Take 1 tablet (25 mg total) by mouth every 2 (two) hours as needed for migraine.  . [DISCONTINUED] azithromycin (ZITHROMAX) 250 MG tablet Take 2 tablets PO on day one, and one tablet PO daily thereafter until completed.  . [DISCONTINUED] cetirizine (ZYRTEC) 10 MG chewable tablet Chew 10 mg by mouth daily. (Patient not taking: Reported on 12/21/2019)  . [DISCONTINUED] fexofenadine (ALLEGRA) 180 MG tablet Take by mouth. (Patient not taking: Reported on 12/21/2019)   No facility-administered medications prior to visit.    Review of Systems  Constitutional: Negative.   HENT: Negative.   Eyes: Negative.   Respiratory: Negative.   Cardiovascular: Negative.   Gastrointestinal: Negative.   Endocrine: Negative.   Genitourinary: Negative.   Musculoskeletal: Positive for arthralgias and joint swelling.  Skin: Negative.   Allergic/Immunologic: Positive for environmental allergies and immunocompromised state.  Neurological: Positive for headaches.  Hematological: Negative.   Psychiatric/Behavioral: Negative.     Last CBC Lab Results  Component Value Date   WBC 7.5 12/22/2018   HGB 14.3 12/22/2018   HCT 41.0 12/22/2018   MCV 87.2 12/22/2018   MCH 30.4 12/22/2018   RDW 12.4 12/22/2018   PLT 263 12/22/2018   Last metabolic panel Lab Results  Component Value Date   GLUCOSE 95 03/26/2019   NA 140 03/26/2019   K 4.0 03/26/2019   CL 104 03/26/2019   CO2 22 03/26/2019   BUN 14 03/26/2019   CREATININE 0.77 03/26/2019   GFRNONAA 90 03/26/2019   GFRAA 104 03/26/2019   CALCIUM 8.6 (L) 03/26/2019   PROT 6.4 03/26/2019   ALBUMIN 4.3 03/26/2019   LABGLOB 2.1 03/26/2019   AGRATIO 2.0 03/26/2019   BILITOT 0.8 03/26/2019   ALKPHOS 41 03/26/2019   AST 17 03/26/2019   ALT 15 03/26/2019   ANIONGAP 8 12/22/2018    Last lipids Lab Results  Component Value Date   CHOL 176 03/26/2019   HDL 30 (L) 03/26/2019   LDLCALC 121 (H) 03/26/2019   TRIG 140 03/26/2019   CHOLHDL 5.9 (H) 03/26/2019   Last hemoglobin A1c Lab Results  Component Value Date   HGBA1C 5.8 (H) 03/26/2019   Last thyroid functions Lab Results  Component Value Date   TSH 0.713 03/26/2019   T4TOTAL 9.2 10/28/2018   Last vitamin  D Lab Results  Component Value Date   VD25OH 27.4 (L) 03/26/2019   Last vitamin B12 and Folate Lab Results  Component Value Date   VITAMINB12 730 03/26/2019   FOLATE >20.0 10/28/2018      Objective    BP 108/72 (BP Location: Left Arm, Patient Position: Sitting, Cuff Size: Normal)   Pulse 77   Temp 98.3 F (36.8 C) (Oral)   Resp 16   Ht 5\' 7"  (1.702 m)   Wt 187 lb (84.8 kg)   SpO2 97%   BMI 29.29 kg/m  BP Readings from Last 3 Encounters:  02/08/20 108/72  04/30/19 (!) 150/90  03/20/19 136/79   Wt Readings from Last 3 Encounters:  02/08/20 187 lb (84.8 kg)  03/20/19 162 lb (73.5 kg)  12/22/18 158 lb (71.7 kg)      Physical Exam Vitals reviewed.  Constitutional:      General: She is not in acute distress.    Appearance: Normal appearance. She is well-developed. She is not diaphoretic.  HENT:     Head: Normocephalic and atraumatic.     Right Ear: Tympanic membrane, ear canal and external ear normal.     Left Ear: Tympanic membrane, ear canal and external ear normal.  Eyes:     General: No scleral icterus.    Conjunctiva/sclera: Conjunctivae normal.     Pupils: Pupils are equal, round, and reactive to light.  Neck:     Thyroid: No thyromegaly.  Cardiovascular:     Rate and Rhythm: Normal rate and regular rhythm.     Pulses: Normal pulses.     Heart sounds: Normal heart sounds. No murmur heard.   Pulmonary:     Effort: Pulmonary effort is normal. No respiratory distress.     Breath sounds: Normal breath sounds. No wheezing or rales.  Chest:     Comments: Breasts:  breasts appear normal, no suspicious masses, no skin or nipple changes or axillary nodes.  Abdominal:     General: There is no distension.     Palpations: Abdomen is soft.     Tenderness: There is no abdominal tenderness.  Genitourinary:    Comments: GYN:  External genitalia within normal limits.  Vaginal mucosa pink, moist, normal rugae.  Nonfriable cervix without lesions, no discharge or bleeding noted on speculum exam.  Bimanual exam revealed normal, nongravid uterus.  No cervical motion tenderness. No adnexal masses bilaterally.    Musculoskeletal:        General: No deformity.     Cervical back: Neck supple.     Right lower leg: No edema.     Left lower leg: No edema.  Lymphadenopathy:     Cervical: No cervical adenopathy.  Skin:    General: Skin is warm and dry.     Findings: No rash.  Neurological:     Mental Status: She is alert and oriented to person, place, and time. Mental status is at baseline.     Sensory: No sensory deficit.     Motor: No weakness.     Gait: Gait normal.  Psychiatric:        Mood and Affect: Mood normal.        Behavior: Behavior normal.        Thought Content: Thought content normal.       Last depression screening scores PHQ 2/9 Scores 02/08/2020 03/20/2019  PHQ - 2 Score 0 0  PHQ- 9 Score 4 4   Last fall risk screening Fall Risk  03/20/2019  Falls in the past year? 0  Number falls in past yr: 0  Injury with Fall? 0   Last Audit-C alcohol use screening Alcohol Use Disorder Test (AUDIT) 03/20/2019  1. How often do you have a drink containing alcohol? 0  2. How many drinks containing alcohol do you have on a typical day when you are drinking? 0  3. How often do you have six or more drinks on one occasion? 0  AUDIT-C Score 0   A score of 3 or more in women, and 4 or more in men indicates increased risk for alcohol abuse, EXCEPT if all of the points are from question 1   No results found for any visits on 02/08/20.  Assessment & Plan     Routine Health Maintenance and Physical Exam  Exercise Activities and Dietary recommendations Goals   None     Immunization History  Administered Date(s) Administered  . Influenza Whole 10/30/2007, 03/01/2009  . Td 09/29/2004    Health Maintenance  Topic Date Due  . Hepatitis C Screening  Never done  . COVID-19 Vaccine (1) Never done  . TETANUS/TDAP  09/30/2014  . MAMMOGRAM  10/16/2020  . PAP SMEAR-Modifier  10/27/2021  . Fecal DNA (Cologuard)  04/07/2022  . HIV Screening  Completed  . INFLUENZA VACCINE  Discontinued    Discussed health benefits of physical activity, and encouraged her to engage in regular exercise appropriate for her age and condition.  Problem List Items Addressed This Visit      Cardiovascular and Mediastinum   Migraine    Chronic and intermittent Continue sumatriptan/maxalt as needed Avoid triggers      Relevant Medications   SUMAtriptan (IMITREX) 25 MG tablet   rizatriptan (MAXALT-MLT) 10 MG disintegrating tablet     Endocrine   Postoperative hypothyroidism    History of Graves' disease, now with postop hypothyroidism S/p total thyroidectomy F/u ENT Continue synthroid at current dose Recheck TSH      Relevant Medications   levothyroxine (SYNTHROID) 125 MCG tablet   Other Relevant Orders   TSH   T4, free   T3, free     Other   Avitaminosis D    Continue supplement  Repeat level      Relevant Orders   VITAMIN D 25 Hydroxy (Vit-D Deficiency, Fractures)   HYPERLIPIDEMIA    Reviewed last lipid panel Not currently on a statin Recheck FLP and CMP Discussed diet and exercise       Relevant Orders   Lipid panel   GAD (generalized anxiety disorder)    Chronic and fairly well controlled Patient has been hesitant to try SSRI/SNRI Only using low dose Xanax very sparingly      Relevant Medications   ALPRAZolam (XANAX) 0.5 MG tablet   SLE (systemic lupus erythematosus) (HCC)    Followed by Rheum Reviewed recent labs No  changes to medications      B12 deficiency    Recheck level      Relevant Orders   B12   Prediabetes    Recommend low carb diet Recheck A1c       Relevant Orders   Hemoglobin A1c    Other Visit Diagnoses    Encounter for annual physical exam    -  Primary   Relevant Orders   MM 3D SCREEN BREAST BILATERAL   Lipid panel   Hemoglobin A1c   VITAMIN D 25 Hydroxy (Vit-D Deficiency, Fractures)   B12   TSH   Cytology - PAP  T4, free   T3, free   Encounter for screening mammogram for malignant neoplasm of breast       Relevant Orders   MM 3D SCREEN BREAST BILATERAL   Cervical high risk human papillomavirus (HPV) DNA test positive       Relevant Orders   Cytology - PAP       No follow-ups on file.     I, Shirlee Latch, MD, have reviewed all documentation for this visit. The documentation on 02/08/20 for the exam, diagnosis, procedures, and orders are all accurate and complete.   Wilda Wetherell, Marzella Schlein, MD, MPH [facility]

## 2020-02-08 NOTE — Patient Instructions (Signed)
Preventive Care 84-52 Years Old, Female Preventive care refers to lifestyle choices and visits with your health care provider that can promote health and wellness. This includes:  A yearly physical exam. This is also called an annual wellness visit.  Regular dental and eye exams.  Immunizations.  Screening for certain conditions.  Healthy lifestyle choices, such as: ? Eating a healthy diet. ? Getting regular exercise. ? Not using drugs or products that contain nicotine and tobacco. ? Limiting alcohol use. What can I expect for my preventive care visit? Physical exam Your health care provider will check your:  Height and weight. These may be used to calculate your BMI (body mass index). BMI is a measurement that tells if you are at a healthy weight.  Heart rate and blood pressure.  Body temperature.  Skin for abnormal spots. Counseling Your health care provider may ask you questions about your:  Past medical problems.  Family's medical history.  Alcohol, tobacco, and drug use.  Emotional well-being.  Home life and relationship well-being.  Sexual activity.  Diet, exercise, and sleep habits.  Work and work Statistician.  Access to firearms.  Method of birth control.  Menstrual cycle.  Pregnancy history. What immunizations do I need? Vaccines are usually given at various ages, according to a schedule. Your health care provider will recommend vaccines for you based on your age, medical history, and lifestyle or other factors, such as travel or where you work.   What tests do I need? Blood tests  Lipid and cholesterol levels. These may be checked every 5 years, or more often if you are over 3 years old.  Hepatitis C test.  Hepatitis B test. Screening  Lung cancer screening. You may have this screening every year starting at age 73 if you have a 30-pack-year history of smoking and currently smoke or have quit within the past 15 years.  Colorectal cancer  screening. ? All adults should have this screening starting at age 52 and continuing until age 17. ? Your health care provider may recommend screening at age 49 if you are at increased risk. ? You will have tests every 1-10 years, depending on your results and the type of screening test.  Diabetes screening. ? This is done by checking your blood sugar (glucose) after you have not eaten for a while (fasting). ? You may have this done every 1-3 years.  Mammogram. ? This may be done every 1-2 years. ? Talk with your health care provider about when you should start having regular mammograms. This may depend on whether you have a family history of breast cancer.  BRCA-related cancer screening. This may be done if you have a family history of breast, ovarian, tubal, or peritoneal cancers.  Pelvic exam and Pap test. ? This may be done every 3 years starting at age 10. ? Starting at age 11, this may be done every 5 years if you have a Pap test in combination with an HPV test. Other tests  STD (sexually transmitted disease) testing, if you are at risk.  Bone density scan. This is done to screen for osteoporosis. You may have this scan if you are at high risk for osteoporosis. Talk with your health care provider about your test results, treatment options, and if necessary, the need for more tests. Follow these instructions at home: Eating and drinking  Eat a diet that includes fresh fruits and vegetables, whole grains, lean protein, and low-fat dairy products.  Take vitamin and mineral supplements  as recommended by your health care provider.  Do not drink alcohol if: ? Your health care provider tells you not to drink. ? You are pregnant, may be pregnant, or are planning to become pregnant.  If you drink alcohol: ? Limit how much you have to 0-1 drink a day. ? Be aware of how much alcohol is in your drink. In the [place] one drink equals one 12 oz bottle of beer (355 mL), one 5 oz glass of  wine (148 mL), or one 1 oz glass of hard liquor (44 mL).   Lifestyle  Take daily care of your teeth and gums. Brush your teeth every morning and night with fluoride toothpaste. Floss one time each day.  Stay active. Exercise for at least 30 minutes 5 or more days each week.  Do not use any products that contain nicotine or tobacco, such as cigarettes, e-cigarettes, and chewing tobacco. If you need help quitting, ask your health care provider.  Do not use drugs.  If you are sexually active, practice safe sex. Use a condom or other form of protection to prevent STIs (sexually transmitted infections).  If you do not wish to become pregnant, use a form of birth control. If you plan to become pregnant, see your health care provider for a prepregnancy visit.  If told by your health care provider, take low-dose aspirin daily starting at age 50.  Find healthy ways to cope with stress, such as: ? Meditation, yoga, or listening to music. ? Journaling. ? Talking to a trusted person. ? Spending time with friends and family. Safety  Always wear your seat belt while driving or riding in a vehicle.  Do not drive: ? If you have been drinking alcohol. Do not ride with someone who has been drinking. ? When you are tired or distracted. ? While texting.  Wear a helmet and other protective equipment during sports activities.  If you have firearms in your house, make sure you follow all gun safety procedures. What's next?  Visit your health care provider once a year for an annual wellness visit.  Ask your health care provider how often you should have your eyes and teeth checked.  Stay up to date on all vaccines. This information is not intended to replace advice given to you by your health care provider. Make sure you discuss any questions you have with your health care provider. Document Revised: 10/20/2019 Document Reviewed: 09/26/2017 Elsevier Patient Education  2021 [employer]vier [employer].  

## 2020-02-08 NOTE — Assessment & Plan Note (Signed)
Recheck level 

## 2020-02-08 NOTE — Assessment & Plan Note (Signed)
Continue supplement Repeat level 

## 2020-02-08 NOTE — Assessment & Plan Note (Signed)
Followed by Rheum Reviewed recent labs No changes to medications

## 2020-02-08 NOTE — Assessment & Plan Note (Signed)
Chronic and intermittent Continue sumatriptan/maxalt as needed Avoid triggers

## 2020-02-08 NOTE — Assessment & Plan Note (Signed)
Recommend low carb diet °Recheck A1c  °

## 2020-02-08 NOTE — Assessment & Plan Note (Signed)
History of Graves' disease, now with postop hypothyroidism S/p total thyroidectomy F/u ENT Continue synthroid at current dose Recheck TSH

## 2020-02-08 NOTE — Assessment & Plan Note (Signed)
Chronic and fairly well controlled Patient has been hesitant to try SSRI/SNRI Only using low dose Xanax very sparingly

## 2020-02-08 NOTE — Assessment & Plan Note (Signed)
Reviewed last lipid panel Not currently on a statin Recheck FLP and CMP Discussed diet and exercise  

## 2020-02-12 LAB — CYTOLOGY - PAP
Adequacy: ABSENT
Comment: NEGATIVE
Diagnosis: NEGATIVE
Diagnosis: REACTIVE
High risk HPV: NEGATIVE

## 2020-02-17 LAB — TSH: TSH: 0.396 u[IU]/mL — ABNORMAL LOW (ref 0.450–4.500)

## 2020-02-17 LAB — LIPID PANEL
Chol/HDL Ratio: 5.6 ratio — ABNORMAL HIGH (ref 0.0–4.4)
Cholesterol, Total: 190 mg/dL (ref 100–199)
HDL: 34 mg/dL — ABNORMAL LOW (ref >39)
LDL Chol Calc (NIH): 126 mg/dL — ABNORMAL HIGH (ref 0–99)
Triglycerides: 168 mg/dL — ABNORMAL HIGH (ref 0–149)
VLDL Cholesterol Cal: 30 mg/dL (ref 5–40)

## 2020-02-17 LAB — VITAMIN D 25 HYDROXY (VIT D DEFICIENCY, FRACTURES): Vit D, 25-Hydroxy: 33.2 ng/mL (ref 30.0–100.0)

## 2020-02-17 LAB — T3, FREE: T3, Free: 2.8 pg/mL (ref 2.0–4.4)

## 2020-02-17 LAB — HEMOGLOBIN A1C
Est. average glucose Bld gHb Est-mCnc: 111 mg/dL
Hgb A1c MFr Bld: 5.5 % (ref 4.8–5.6)

## 2020-02-17 LAB — VITAMIN B12: Vitamin B-12: 545 pg/mL (ref 232–1245)

## 2020-02-17 LAB — T4, FREE: Free T4: 1.95 ng/dL — ABNORMAL HIGH (ref 0.82–1.77)

## 2020-02-25 ENCOUNTER — Telehealth: Payer: Self-pay

## 2020-02-25 ENCOUNTER — Encounter: Payer: Self-pay | Admitting: Family Medicine

## 2020-02-25 NOTE — Telephone Encounter (Signed)
Copied from CRM [id number]. Topic: General - Other >> Feb 25, 2020  3:40 PM Jaquita Rector A wrote: Reason for CRM: Patient called in to inform Dr B that her weight is 20 lbs too heavy on her chart Per patient she weight 167 and not 187. She did not want to be medicated according to her weight. Please advise Can be reached at Ph# [phone]

## 2020-02-26 MED ORDER — TRAZODONE HCL 50 MG PO TABS: 25.0000 mg | ORAL_TABLET | Freq: Every evening | ORAL | 3 refills | Status: DC | PRN

## 2020-02-26 NOTE — Telephone Encounter (Signed)
Left message for patient

## 2020-02-26 NOTE — Telephone Encounter (Signed)
Please let patient know that it has been corrected. Good news is that we only weight-based dose medication in pediatrics.

## 2020-03-08 ENCOUNTER — Ambulatory Visit
Admission: RE | Admit: 2020-03-08 | Discharge: 2020-03-08 | Disposition: A | Discharge status: Home / Self Care | Payer: Managed Care, Other (non HMO) | Source: Ambulatory Visit | Attending: Family Medicine | Admitting: Family Medicine

## 2020-03-08 ENCOUNTER — Other Ambulatory Visit: Payer: Self-pay

## 2020-03-08 DIAGNOSIS — Z1231 Encounter for screening mammogram for malignant neoplasm of breast: Secondary | ICD-10-CM

## 2020-03-08 DIAGNOSIS — Z0000 Encounter for general adult medical examination without abnormal findings: Secondary | ICD-10-CM

## 2020-04-22 ENCOUNTER — Telehealth: Payer: Managed Care, Other (non HMO) | Admitting: Family Medicine

## 2020-05-09 ENCOUNTER — Encounter: Payer: Self-pay | Admitting: Emergency Medicine

## 2020-05-09 ENCOUNTER — Ambulatory Visit
Admission: EM | Admit: 2020-05-09 | Discharge: 2020-05-09 | Disposition: A | Discharge status: Home / Self Care | Payer: Managed Care, Other (non HMO) | Attending: Physician Assistant | Admitting: Physician Assistant

## 2020-05-09 ENCOUNTER — Other Ambulatory Visit: Payer: Self-pay

## 2020-05-09 DIAGNOSIS — L299 Pruritus, unspecified: Secondary | ICD-10-CM

## 2020-05-09 DIAGNOSIS — R21 Rash and other nonspecific skin eruption: Secondary | ICD-10-CM | POA: Diagnosis not present

## 2020-05-09 MED ORDER — PREDNISONE 10 MG PO TABS: ORAL_TABLET | ORAL | 0 refills | Status: DC

## 2020-05-09 NOTE — ED Provider Notes (Signed)
[facility]    CSN: [id number] Arrival date & time: 05/09/20  1522      History   Chief Complaint Chief Complaint  Patient presents with  . Rash  . Pruritis    HPI Caroline Mullen is a 50 y.o. female presenting for itchy rash that has been coming and going across her upper and lower extremities, back and chest for about a week.  She says that when she takes Benadryl it improves the rash but she continues to have some itching.  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  She denies any pain associated with the rash.  She does have a history of lupus but says that she is never broke out in rashes with lupus before.  Patient states that she has been concerned she has been allergic or intolerant to eggs and nuts because she does not feel well when she eats it so she has made an appoint with an allergist for next week.  She cannot think of any new medications she is taken or topical ointments, creams, lotions or perfumes.  Denies any new detergents.  She is not complaining of any facial swelling, throat tightness or breathing problem.  No other complaints or concerns.  HPI  Past Medical History:  Diagnosis Date  . Anxiety   . Connective tissue disease (HCC)    on Plaquenil    NO LONGER ON MEDS NO PROBLEMS X 6 YEARS  . Graves disease   . IRRITABLE BOWEL SYNDROME, HX OF 04/22/2006   Qualifier: History of  By: Milinda Antis MD, Colon Flattery   . Lupus (HCC)   . Migraine   . Thyroid nodule 02/19/2011    Patient Active Problem List   Diagnosis Date Noted  . Prediabetes 03/20/2019  . Postoperative hypothyroidism 08/08/2015  . B12 deficiency 11/17/2010  . Migraine 04/04/2010  . Avitaminosis D 02/25/2008  . HYPERLIPIDEMIA 11/22/2006  . GAD (generalized anxiety disorder) 04/22/2006  . SLE (systemic lupus erythematosus) (HCC) 04/22/2006    Past Surgical History:  Procedure Laterality Date  . ABDOMINAL HYSTERECTOMY      fibroid uterine tumors supracervical  . CESAREAN SECTION    . HEMORRHOID BANDING    . KNEE ARTHROSCOPY    . SHOULDER SURGERY  1998  . THYROIDECTOMY N/A 08/08/2015   Procedure: THYROIDECTOMY;  Surgeon: Vernie Murders, MD;  Location: [facility] ORS;  Service: ENT;  Laterality: N/A;  . TUBAL LIGATION      OB History    Gravida  2   Para      Term      Preterm      AB      Living        SAB      IAB      Ectopic      Multiple      Live Births               Home Medications    Prior to Admission medications   Medication Sig Start Date End Date Taking? Authorizing Provider  ALPRAZolam Prudy Feeler) 0.5 MG tablet Take 1 tablet (0.5 mg total) by mouth 2 (two) times daily as needed. for anxiety 02/08/20  Yes Bacigalupo, Marzella Schlein, MD  hydroxychloroquine (PLAQUENIL) 200 MG tablet TAKE 1 AND 1/2 TABLETS(300 MG) BY MOUTH DAILY 03/18/18  Yes [provider]  ibuprofen (ADVIL,MOTRIN) 200 MG tablet Take 600 mg by mouth every 6 (six) hours as needed  for moderate pain.   Yes [provider]  leflunomide (ARAVA) 10 MG tablet TAKE 1 TABLET(10 MG) BY MOUTH DAILY 09/30/18  Yes [provider]  levothyroxine (SYNTHROID) 125 MCG tablet Take 1 tablet (125 mcg total) by mouth daily before breakfast. 02/08/20  Yes Bacigalupo, Marzella Schlein, MD  Multiple Vitamin (MULTIVITAMIN) capsule Take by mouth.   Yes [provider]  predniSONE (DELTASONE) 10 MG tablet Take 6 tabs PO today and decrease by 1 tab daily x 5 days 05/09/20  Yes Eusebio Friendly B, PA-C  rizatriptan (MAXALT-MLT) 10 MG disintegrating tablet Take 1 tablet (10 mg total) by mouth as needed for migraine. May repeat in 2 hours if needed 02/08/20  Yes Bacigalupo, Marzella Schlein, MD  SUMAtriptan (IMITREX) 25 MG tablet Take 1 tablet (25 mg total) by mouth every 2 (two) hours as needed for migraine. 02/08/20  Yes Bacigalupo, Marzella Schlein, MD  traZODone (DESYREL) 50 MG tablet Take 0.5-1 tablets (25-50 mg total) by mouth at bedtime as  needed for sleep. 02/26/20  Yes Bacigalupo, Marzella Schlein, MD  melatonin 5 MG TABS Take 5 mg by mouth at bedtime.    [provider]  Omega-3 Fatty Acids (FISH OIL) 1000 MG CAPS Take by mouth.    [provider]    Family History Family History  Problem Relation Age of Onset  . Cancer Father        skin CA Basal cell died 16-May-2002  . Diabetes Mother   . Hypertension Mother   . Hyperlipidemia Mother   . Hypertension Maternal Grandmother   . Hyperlipidemia Maternal Grandmother   . Lung cancer Maternal Grandfather   . Breast cancer Neg Hx   . Colon cancer Neg Hx     Social History Social History   Tobacco Use  . Smoking status: Former Smoker    Packs/day: 0.25    Years: 15.00    Pack years: 3.75    Types: Cigarettes    Quit date: 12/28/2007    Years since quitting: 12.3  . Smokeless tobacco: Never Used  . Tobacco comment: quit about 5 years ago  Vaping Use  . Vaping Use: Never used  Substance Use Topics  . Alcohol use: No  . Drug use: No     Allergies   Morphine   Review of Systems Review of Systems  Constitutional: Negative for fatigue and fever.  HENT: Negative for facial swelling, trouble swallowing and voice change.   Respiratory: Negative for shortness of breath and wheezing.   Musculoskeletal: Negative for arthralgias and myalgias.  Skin: Positive for rash. Negative for wound.  Neurological: Negative for dizziness, weakness, numbness and headaches.     Physical Exam Triage Vital Signs ED Triage Vitals  Enc Vitals Group     BP 05/09/20 1538 (!) 147/80     Pulse Rate 05/09/20 1538 78     Resp 05/09/20 1538 18     Temp 05/09/20 1538 98.3 F (36.8 C)     Temp src --      SpO2 05/09/20 1538 100 %     Weight 05/09/20 1536 167 lb 1.7 oz (75.8 kg)     Height 05/09/20 1536 5\' 7"  (1.702 m)     Head Circumference --      Peak Flow --      Pain Score 05/09/20 1536 0     Pain Loc --      Pain Edu? --      Excl. in GC? --  No data  found.  Updated Vital Signs BP (!) 147/80 (BP Location: Left Arm)   Pulse 78   Temp 98.3 F (36.8 C)   Resp 18   Ht 5\' 7"  (1.702 m)   Wt 167 lb 1.7 oz (75.8 kg)   SpO2 100%   BMI 26.17 kg/m       Physical Exam Vitals and nursing note reviewed.  Constitutional:      General: She is not in acute distress.    Appearance: Normal appearance. She is not ill-appearing or toxic-appearing.  HENT:     Head: Normocephalic and atraumatic.     Nose: Nose normal.     Mouth/Throat:     Mouth: Mucous membranes are moist.     Pharynx: Oropharynx is clear.  Eyes:     General: No scleral icterus.       Right eye: No discharge.        Left eye: No discharge.     Conjunctiva/sclera: Conjunctivae normal.  Cardiovascular:     Rate and Rhythm: Normal rate and regular rhythm.     Heart sounds: Normal heart sounds.  Pulmonary:     Effort: Pulmonary effort is normal. No respiratory distress.     Breath sounds: Normal breath sounds.  Musculoskeletal:     Cervical back: Neck supple.  Skin:    General: Skin is dry.     Findings: Rash (dry erythematous macular and papular rash affecting bilateral upper exts, chest and back) present.  Neurological:     General: No focal deficit present.     Mental Status: She is alert. Mental status is at baseline.     Motor: No weakness.     Gait: Gait normal.  Psychiatric:        Mood and Affect: Mood normal.        Behavior: Behavior normal.        Thought Content: Thought content normal.      UC Treatments / Results  Labs (all labs ordered are listed, but only abnormal results are displayed) Labs Reviewed - No data to display  EKG   Radiology No results found.  Procedures Procedures (including critical care time)  Medications Ordered in UC Medications - No data to display  Initial Impression / Assessment and Plan / UC Course  I have reviewed the triage vital signs and the nursing notes.  Pertinent labs & imaging results that were  available during my care of the patient were reviewed by me and considered in my medical decision making (see chart for details).   50 year old female presenting for itchy rash is been coming and going for a week.  It does respond to Benadryl.  Suspect possible allergy and she does have allergy testing next week.  Advised her to continue with the Benadryl at this time and start prednisone.  Advised her to keep a journal to see if she can identify any potential triggers.  Reviewed return and ED precautions with patient.   Final Clinical Impressions(s) / UC Diagnoses   Final diagnoses:  Rash and nonspecific skin eruption  Pruritus     Discharge Instructions     Take 1 to 2 tablets of Benadryl every 4-6 hours as long as the rash continues to start the prednisone today.  Try to keep a journal of any possible triggers and follow-up with the allergist next week when you get allergy testing.  Please call or return here if any symptoms worsen.  Go to ED for any  facial swelling, chest tightness or breathing difficulty.    ED Prescriptions    Medication Sig Dispense Auth. Provider   predniSONE (DELTASONE) 10 MG tablet Take 6 tabs PO today and decrease by 1 tab daily x 5 days 21 tablet Shirlee LatchEaves, Lizandra Zakrzewski B, PA-C     PDMP not reviewed this encounter.   Shirlee Latchaves, Kyle Luppino B, PA-C 05/09/20 1630

## 2020-05-09 NOTE — ED Triage Notes (Signed)
Pt c/o rash, and itching. She states it worse in the corners of his arms, and wrist. Started about a week ago. She has been taking benadryl and helped. She is concerned for scabies.

## 2020-05-09 NOTE — Discharge Instructions (Addendum)
Take 1 to 2 tablets of Benadryl every 4-6 hours as long as the rash continues to start the prednisone today.  Try to keep a journal of any possible triggers and follow-up with the allergist next week when you get allergy testing.  Please call or return here if any symptoms worsen.  Go to ED for any facial swelling, chest tightness or breathing difficulty.

## 2020-06-12 IMAGING — MG MM DIGITAL SCREENING BILAT W/ TOMO W/ CAD
6 of 12 series · 6 of 36 positions shown · non-contrast
Comparison: Previous exam(s).

CLINICAL DATA: Screening.

EXAM:
DIGITAL SCREENING BILATERAL MAMMOGRAM WITH TOMO AND CAD

[L MLO synth-2D (1 of 2)]
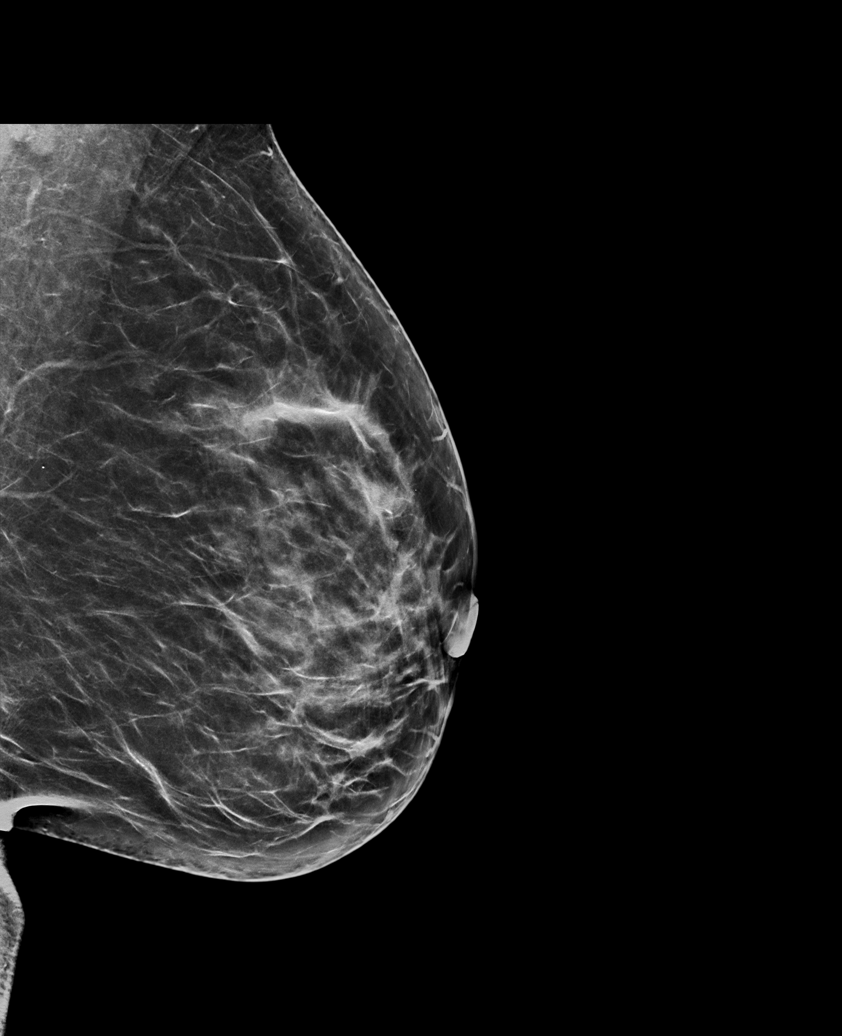

[L MLO synth-2D (2 of 2)]
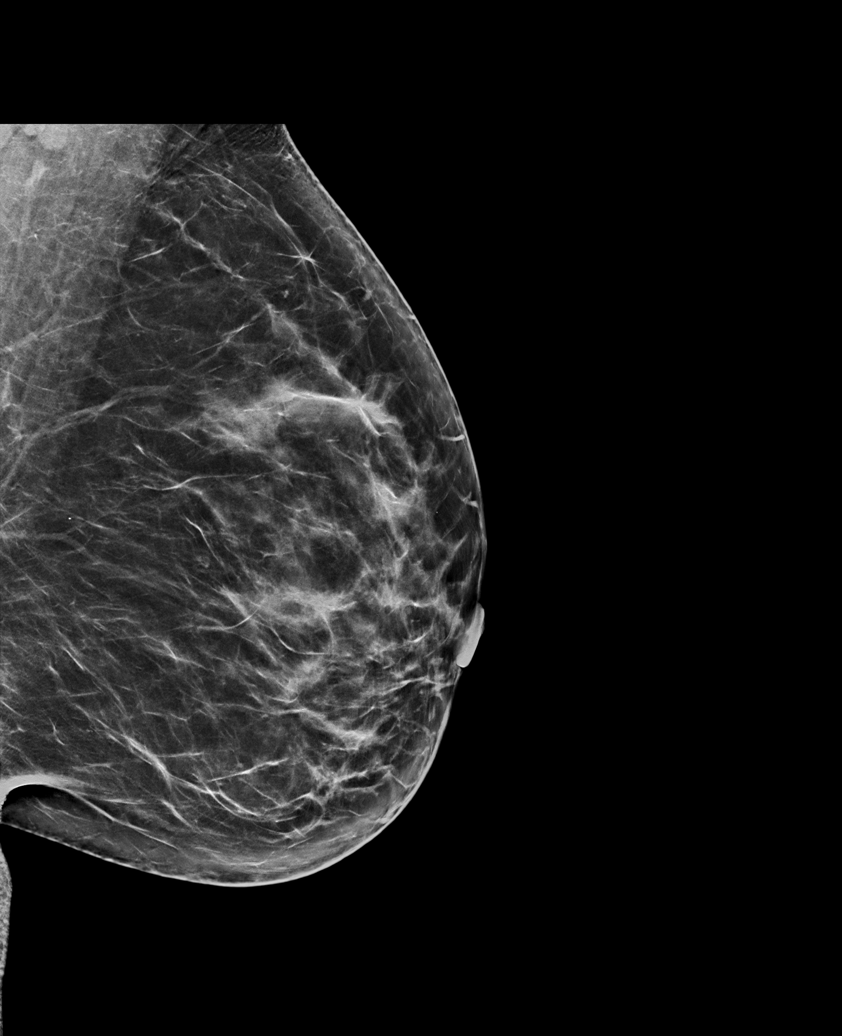

[L CC synth-2D]
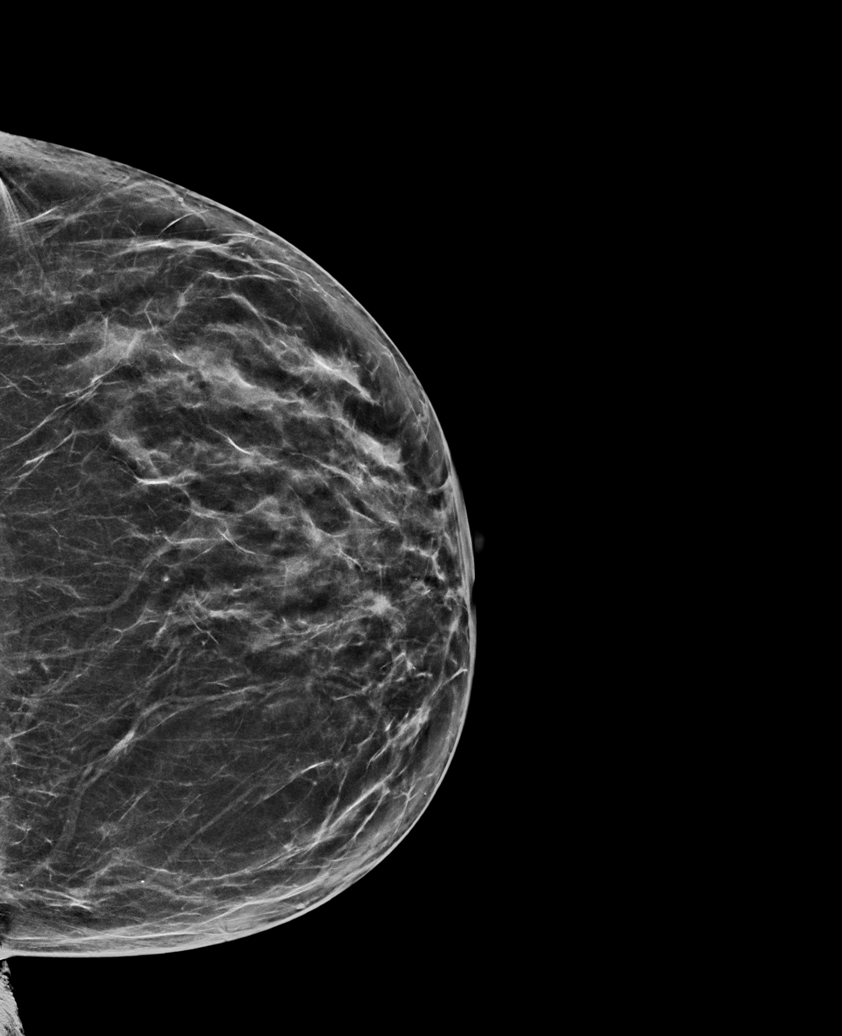

[R MLO synth-2D (1 of 2)]
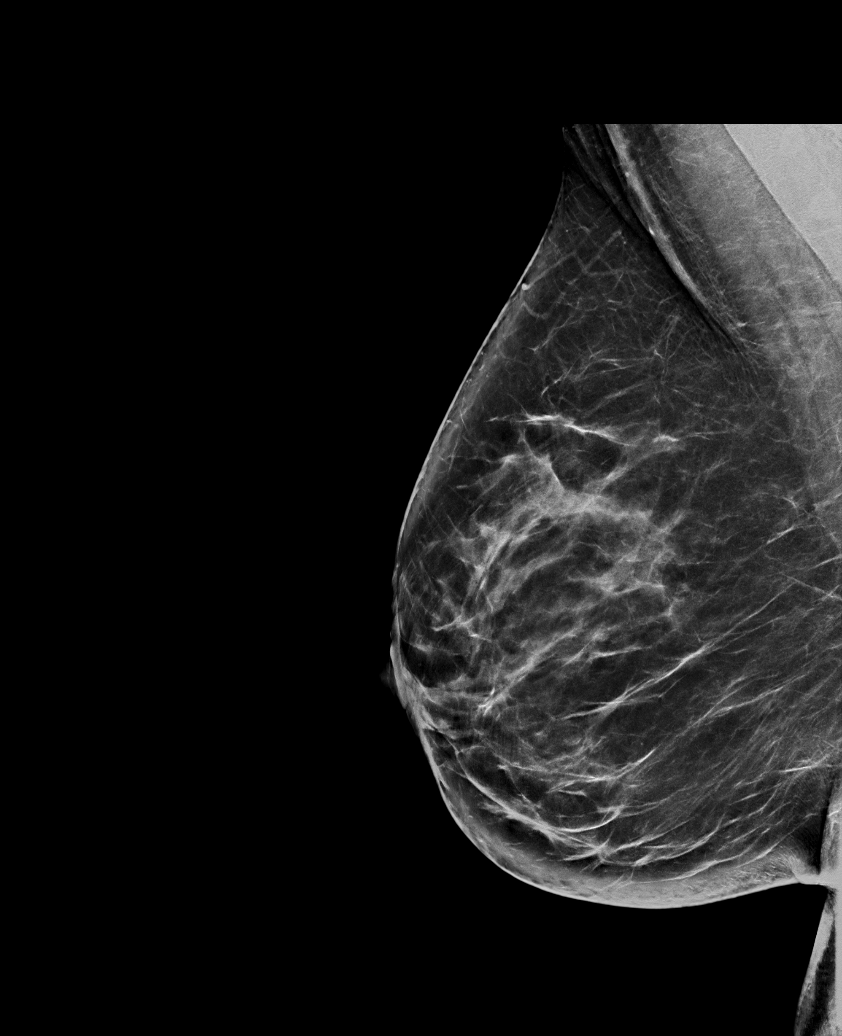

[R CC synth-2D]
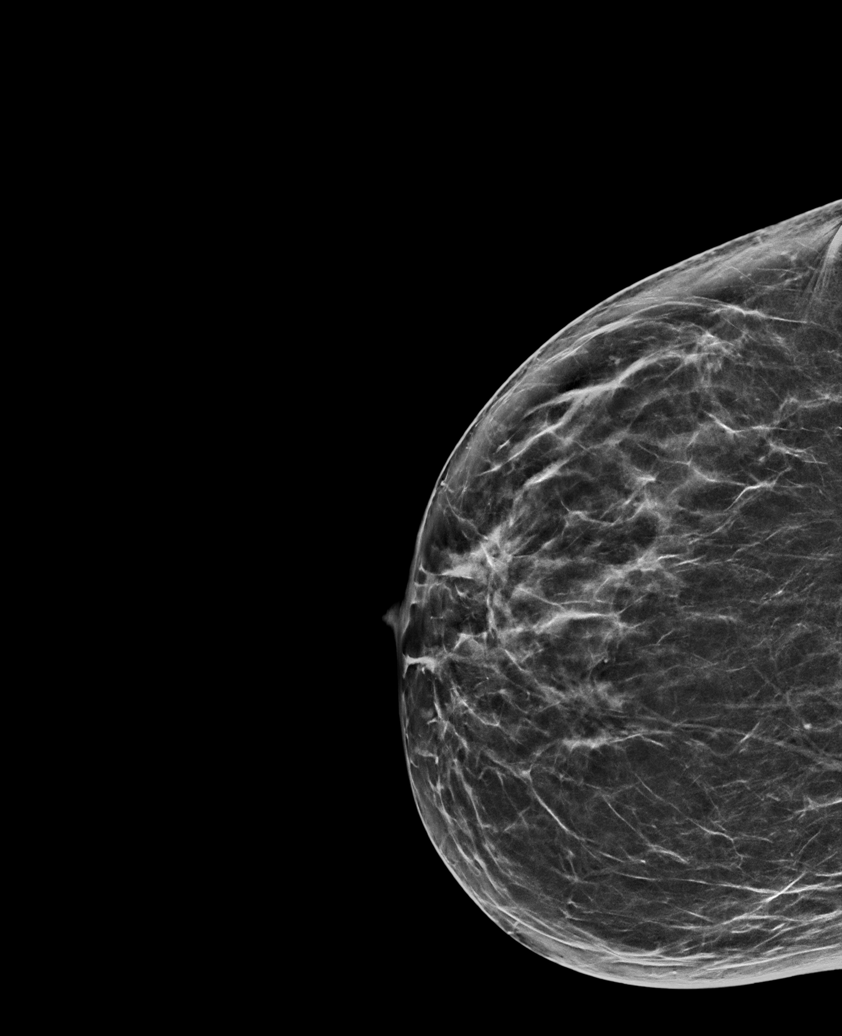

[R MLO synth-2D (2 of 2)]
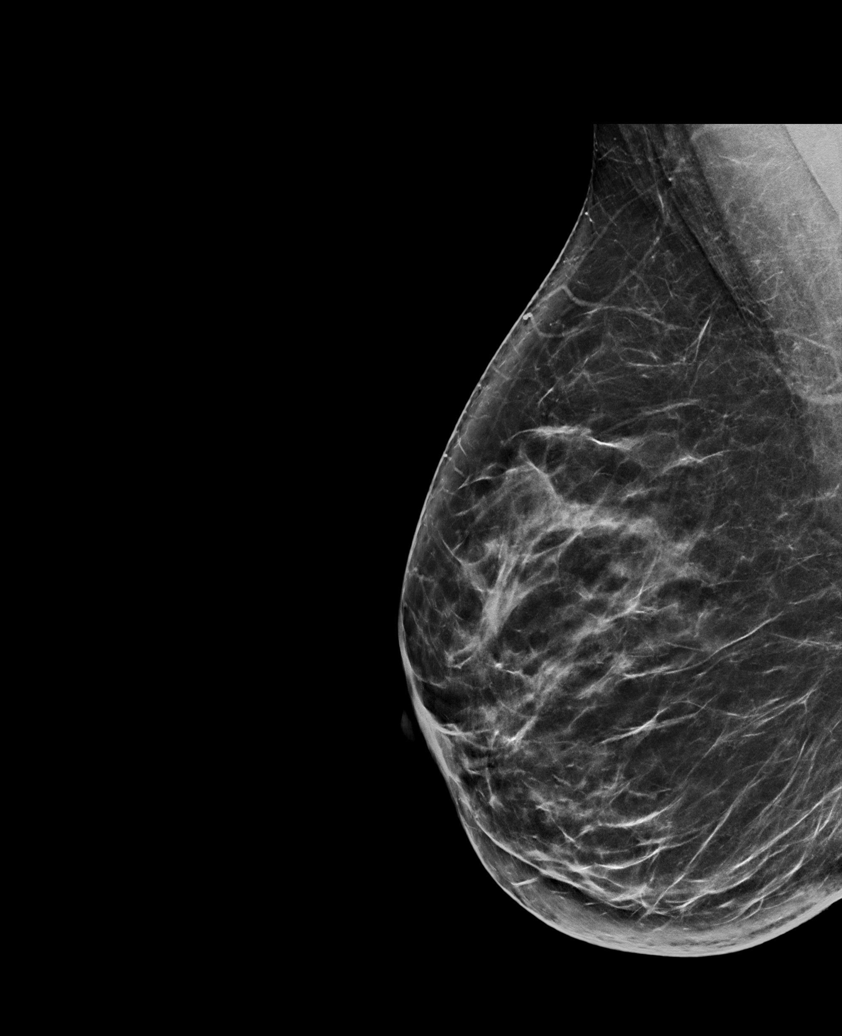

[6 of 36 positions shown; findings below may reference images not displayed]

ACR Breast Density Category b: There are scattered areas of
fibroglandular density.
FINDINGS: There are no findings suspicious for malignancy. Images were
processed with CAD.
IMPRESSION: No mammographic evidence of malignancy. A result letter of this
screening mammogram will be mailed directly to the patient.

RECOMMENDATION:
Screening mammogram in one year. (Code:[id number])

BI-RADS CATEGORY  1: Negative.

## 2020-08-08 ENCOUNTER — Ambulatory Visit: Payer: Self-pay | Admitting: Family Medicine

## 2021-02-08 NOTE — Progress Notes (Signed)
Complete physical exam   [name]   DOB: 02-25-1970   51 y.o. Female  MRN: [id number] Visit Date: 02/09/2021  Today's healthcare provider: Shirlee LatchAngela Kingsley Farace, MD   Chief Complaint  Patient presents with   Annual Exam   I,Sulibeya S Dimas,acting as a scribe for Shirlee LatchAngela Estreya Clay, MD.,have documented all relevant documentation on the behalf of Shirlee LatchAngela Tanith Dagostino, MD,as directed by  Shirlee LatchAngela Rudie Rikard, MD while in the presence of Shirlee LatchAngela Quintara Bost, MD.  Subjective    Caroline LaineMelissa T Picco is a 51 y.o. female who presents today for a complete physical exam.  She reports consuming a general diet. Home exercise routine includes stretching. She generally feels fairly well. She reports sleeping fairly well. She does have additional problems to discuss today.  HPI  02/08/20 Pap/HPV-negative 03/08/20 Mammogram-BI-RADS 1 04/07/19 Cologuard negative  Declines Shingrix  Takes trazodone infrequently, but last week took it several nights in a row and had one short episode of palpitations  Has been f/b Endocrinology for her hypothyroidism now that her ENT is retiring. She has had to have multiple med changes this year.   Past Medical History:  Diagnosis Date   Anxiety    Connective tissue disease (HCC)    on Plaquenil    NO LONGER ON MEDS NO PROBLEMS X 6 YEARS   Graves disease    IRRITABLE BOWEL SYNDROME, HX OF 04/22/2006   Qualifier: History of  By: Tower MD, Colon FlatteryMarne Ann    Lupus [facility](HCC)    Migraine    Thyroid nodule 02/19/2011   Past Surgical History:  Procedure Laterality Date   ABDOMINAL HYSTERECTOMY     fibroid uterine tumors supracervical   CESAREAN SECTION     HEMORRHOID BANDING     KNEE ARTHROSCOPY     SHOULDER SURGERY  1998   THYROIDECTOMY N/A 08/08/2015   Procedure: THYROIDECTOMY;  Surgeon: Vernie MurdersPaul Juengel, MD;  Location: [facility] ORS;  Service: ENT;  Laterality: N/A;   TUBAL LIGATION     Social History   Socioeconomic History   Marital status: Married    Spouse  name: Not on file   Number of children: 2   Years of education: Not on file   Highest education level: Not on file  Occupational History   Not on file  Tobacco Use   Smoking status: Former    Packs/day: 0.25    Years: 15.00    Pack years: 3.75    Types: Cigarettes    Quit date: 12/28/2007    Years since quitting: 13.1   Smokeless tobacco: Never  Vaping Use   Vaping Use: Never used  Substance and Sexual Activity   Alcohol use: No   Drug use: No   Sexual activity: Yes    Partners: Male    Birth control/protection: Surgical  Other Topics Concern   Not on file  Social History Narrative   Not on file   Social Determinants of Health   Financial Resource Strain: Not on file  Food Insecurity: Not on file  Transportation Needs: Not on file  Physical Activity: Not on file  Stress: Not on file  Social Connections: Not on file  Intimate Partner Violence: Not on file   Family Status  Relation Name Status   Father  Deceased at age 53       skin CA Basal cell   Mother  (Not Specified)   MGM  Deceased   MGF  Deceased   Neg Hx  (Not Specified)  Family History  Problem Relation Age of Onset   Cancer Father        skin CA Basal cell died 06-02-02   Diabetes Mother    Hypertension Mother    Hyperlipidemia Mother    Hypertension Maternal Grandmother    Hyperlipidemia Maternal Grandmother    Lung cancer Maternal Grandfather    Breast cancer Neg Hx    Colon cancer Neg Hx    Allergies  Allergen Reactions   Morphine Hives    REACTION: rash    Patient Care Team: Erasmo Downer, MD as PCP - General (Family Medicine)   Medications: Outpatient Medications Prior to Visit  Medication Sig   hydroxychloroquine (PLAQUENIL) 200 MG tablet TAKE 1 AND 1/2 TABLETS(300 MG) BY MOUTH DAILY   ibuprofen (ADVIL,MOTRIN) 200 MG tablet Take 600 mg by mouth every 6 (six) hours as needed for moderate pain.   leflunomide (ARAVA) 10 MG tablet TAKE 1 TABLET(10 MG) BY MOUTH DAILY    levothyroxine (SYNTHROID) 125 MCG tablet Take 1 tablet (125 mcg total) by mouth daily before breakfast. (Patient taking differently: Take 100 mcg by mouth daily before breakfast.)   Multiple Vitamin (MULTIVITAMIN) capsule Take by mouth.   Omega-3 Fatty Acids (FISH OIL) 1000 MG CAPS Take by mouth.   SUMAtriptan (IMITREX) 25 MG tablet Take 1 tablet (25 mg total) by mouth every 2 (two) hours as needed for migraine.   [DISCONTINUED] ALPRAZolam (XANAX) 0.5 MG tablet Take 1 tablet (0.5 mg total) by mouth 2 (two) times daily as needed. for anxiety   [DISCONTINUED] rizatriptan (MAXALT-MLT) 10 MG disintegrating tablet Take 1 tablet (10 mg total) by mouth as needed for migraine. May repeat in 2 hours if needed   [DISCONTINUED] traZODone (DESYREL) 50 MG tablet Take 0.5-1 tablets (25-50 mg total) by mouth at bedtime as needed for sleep.   [DISCONTINUED] melatonin 5 MG TABS Take 5 mg by mouth at bedtime. (Patient not taking: Reported on 02/09/2021)   [DISCONTINUED] predniSONE (DELTASONE) 10 MG tablet Take 6 tabs PO today and decrease by 1 tab daily x 5 days (Patient not taking: Reported on 02/09/2021)   No facility-administered medications prior to visit.    Review of Systems  Constitutional:  Positive for diaphoresis.  Cardiovascular:  Positive for palpitations.  Gastrointestinal:  Positive for abdominal distention.  Genitourinary:  Positive for frequency.  Musculoskeletal:  Positive for neck pain and neck stiffness.  Allergic/Immunologic: Positive for immunocompromised state.  Psychiatric/Behavioral:  The patient is nervous/anxious.    Last CBC Lab Results  Component Value Date   WBC 7.5 12/22/2018   HGB 14.3 12/22/2018   HCT 41.0 12/22/2018   MCV 87.2 12/22/2018   MCH 30.4 12/22/2018   RDW 12.4 12/22/2018   PLT 263 12/22/2018   Last metabolic panel Lab Results  Component Value Date   GLUCOSE 95 03/26/2019   NA 140 03/26/2019   K 4.0 03/26/2019   CL 104 03/26/2019   CO2 22 03/26/2019    BUN 14 03/26/2019   CREATININE 0.77 03/26/2019   GFRNONAA 90 03/26/2019   CALCIUM 8.6 (L) 03/26/2019   PROT 6.4 03/26/2019   ALBUMIN 4.3 03/26/2019   LABGLOB 2.1 03/26/2019   AGRATIO 2.0 03/26/2019   BILITOT 0.8 03/26/2019   ALKPHOS 41 03/26/2019   AST 17 03/26/2019   ALT 15 03/26/2019   ANIONGAP 8 12/22/2018   Last lipids Lab Results  Component Value Date   CHOL 190 02/16/2020   HDL 34 (L) 02/16/2020   LDLCALC 126 (H)  02/16/2020   TRIG 168 (H) 02/16/2020   CHOLHDL 5.6 (H) 02/16/2020   Last hemoglobin A1c Lab Results  Component Value Date   HGBA1C 5.5 02/16/2020   Last thyroid functions Lab Results  Component Value Date   TSH 0.396 (L) 02/16/2020   T4TOTAL 9.2 10/28/2018      Objective    BP 138/69    Pulse 72    Temp 98 F (36.7 C) (Temporal)    Resp 16    Ht 5\' 7"  (1.702 m)    Wt 163 lb 12.8 oz (74.3 kg)    SpO2 100%    BMI 25.65 kg/m  BP Readings from Last 3 Encounters:  02/09/21 138/69  05/09/20 (!) 147/80  02/08/20 108/72   Wt Readings from Last 3 Encounters:  02/09/21 163 lb 12.8 oz (74.3 kg)  05/09/20 167 lb 1.7 oz (75.8 kg)  02/26/20 167 lb (75.8 kg)      Physical Exam Vitals reviewed.  Constitutional:      General: She is not in acute distress.    Appearance: Normal appearance. She is well-developed. She is not diaphoretic.  HENT:     Head: Normocephalic and atraumatic.     Right Ear: Tympanic membrane, ear canal and external ear normal.     Left Ear: Tympanic membrane, ear canal and external ear normal.     Nose: Nose normal.     Mouth/Throat:     Mouth: Mucous membranes are moist.     Pharynx: Oropharynx is clear. No oropharyngeal exudate.  Eyes:     General: No scleral icterus.    Conjunctiva/sclera: Conjunctivae normal.     Pupils: Pupils are equal, round, and reactive to light.  Neck:     Thyroid: No thyromegaly.  Cardiovascular:     Rate and Rhythm: Normal rate and regular rhythm.     Pulses: Normal pulses.     Heart sounds:  Normal heart sounds. No murmur heard. Pulmonary:     Effort: Pulmonary effort is normal. No respiratory distress.     Breath sounds: Normal breath sounds. No wheezing or rales.  Chest:     Comments: Breasts: breasts appear normal, no suspicious masses, no skin or nipple changes or axillary nodes  Abdominal:     General: There is no distension.     Palpations: Abdomen is soft.     Tenderness: There is no abdominal tenderness.  Musculoskeletal:        General: No deformity.     Cervical back: Neck supple.     Right lower leg: No edema.     Left lower leg: No edema.  Lymphadenopathy:     Cervical: No cervical adenopathy.  Skin:    General: Skin is warm and dry.     Findings: No rash.  Neurological:     Mental Status: She is alert and oriented to person, place, and time. Mental status is at baseline.     Sensory: No sensory deficit.     Motor: No weakness.     Gait: Gait normal.  Psychiatric:        Mood and Affect: Mood normal.        Behavior: Behavior normal.        Thought Content: Thought content normal.      Last depression screening scores PHQ 2/9 Scores 02/09/2021 02/08/2020 03/20/2019  PHQ - 2 Score 0 0 0  PHQ- 9 Score 2 4 4    Last fall risk screening Fall Risk  02/09/2021  Falls in the past year? 0  Number falls in past yr: 0  Injury with Fall? 0  Risk for fall due to : No Fall Risks  Follow up Falls evaluation completed   Last Audit-C alcohol use screening Alcohol Use Disorder Test (AUDIT) 02/09/2021  1. How often do you have a drink containing alcohol? 0  2. How many drinks containing alcohol do you have on a typical day when you are drinking? 0  3. How often do you have six or more drinks on one occasion? 0  AUDIT-C Score 0   A score of 3 or more in women, and 4 or more in men indicates increased risk for alcohol abuse, EXCEPT if all of the points are from question 1   Results for orders placed or performed in visit on 02/09/21  POCT Urinalysis Dipstick   Result Value Ref Range   Color, UA yellow    Clarity, UA clear    Glucose, UA Negative Negative   Bilirubin, UA Negative    Ketones, UA Negative    Spec Grav, UA 1.010 1.010 - 1.025   Blood, UA Negative    pH, UA 6.0 5.0 - 8.0   Protein, UA Negative Negative   Urobilinogen, UA 0.2 0.2 or 1.0 E.U./dL   Nitrite, UA Negative    Leukocytes, UA Negative Negative    Assessment & Plan    Routine Health Maintenance and Physical Exam  Exercise Activities and Dietary recommendations  Goals   None     Immunization History  Administered Date(s) Administered   Influenza Whole 10/30/2007, 03/01/2009   Td 09/29/2004    Health Maintenance  Topic Date Due   Pneumococcal Vaccine 6619-53 Years old (1 - PCV) Never done   HIV Screening  Never done   Hepatitis C Screening  Never done   Zoster Vaccines- Shingrix (1 of 2) Never done   TETANUS/TDAP  09/30/2014   COVID-19 Vaccine (1) 02/25/2021 (Originally 07/17/1971)   MAMMOGRAM  03/08/2022   Fecal DNA (Cologuard)  04/07/2022   PAP SMEAR-Modifier  02/08/2023   HPV VACCINES  Aged Out   INFLUENZA VACCINE  Discontinued   COLONOSCOPY (Pts [id number] Insurance coverage will need to be confirmed)  Discontinued    Discussed health benefits of physical activity, and encouraged her to engage in regular exercise appropriate for her age and condition.  Problem List Items Addressed This Visit       Endocrine   Postoperative hypothyroidism    History of Graves' disease, now with postop hypothyroidism Status post total thyroidectomy Followed by endocrinology No changes to set her Synthroid dose today, but we will recheck her TSH      Relevant Orders   TSH     Other   Avitaminosis D    Continue supplement Repeat level      Relevant Orders   VITAMIN D 25 Hydroxy (Vit-D Deficiency, Fractures)   HYPERLIPIDEMIA    Reviewed last lipid panel Not currently on a statin Recheck FLP and CMP Discussed diet and exercise       Relevant Orders    Comprehensive metabolic panel   Lipid Panel With LDL/HDL Ratio   GAD (generalized anxiety disorder)    Chronic and fairly well controlled Patient has been hesitant to try SSRI/SNRI Only using low-dose Xanax very sparingly, so we will continue      Relevant Medications   ALPRAZolam (XANAX) 0.5 MG tablet   traZODone (DESYREL) 50 MG tablet   SLE (systemic lupus erythematosus) (HCC)  Followed by rheumatology No changes to medications      B12 deficiency    Continue supplement Repeat level      Relevant Orders   Vitamin B12   Prediabetes    Recommend low carb diet Recheck A1c      Relevant Orders   Hemoglobin A1c   Insomnia    Chronic and stable It is well controlled when she takes her trazodone 25 mg nightly, but she is hesitant to take this See discussion below about palpitations Encouraged her to take the trazodone since it does help her sleep significantly better      Palpitations    New problem Only 1 short episode Normal exam today Check labs to ensure no underlying issue Discussed with patient that I would expect this to be more likely related to her thyroid which has required multiple medication changes this year rather than her trazodone We are rechecking TSH today as above If this recurs or persists, we will consider Zio patch at that time      Macromastia    Patient with recurrent intertrigo under her breasts, chronic neck and back pain, chronic indentations from bra straps She wears a G cup bra and is interested in breast reduction surgery Referral to plastic surgery      Relevant Orders   Ambulatory referral to Plastic Surgery   Other Visit Diagnoses     Encounter for annual physical exam    -  Primary   Relevant Orders   Comprehensive metabolic panel   Lipid Panel With LDL/HDL Ratio   TSH   Encounter for screening mammogram for malignant neoplasm of breast       Relevant Orders   MM 3D SCREEN BREAST BILATERAL   Urinary frequency        Relevant Orders   POCT Urinalysis Dipstick (Completed)        Return in about 1 year (around 02/09/2022) for CPE.     I, Shirlee Latch, MD, have reviewed all documentation for this visit. The documentation on 02/09/21 for the exam, diagnosis, procedures, and orders are all accurate and complete.   Florena Kozma, Marzella Schlein, MD, MPH [facility]

## 2021-02-09 ENCOUNTER — Encounter: Payer: Self-pay | Admitting: Family Medicine

## 2021-02-09 ENCOUNTER — Other Ambulatory Visit: Payer: Self-pay

## 2021-02-09 ENCOUNTER — Ambulatory Visit (INDEPENDENT_AMBULATORY_CARE_PROVIDER_SITE_OTHER): Payer: Managed Care, Other (non HMO) | Admitting: Family Medicine

## 2021-02-09 VITALS — BP 138/69 | HR 72 | Temp 98.0°F | Resp 16 | Ht 67.0 in | Wt 163.8 lb

## 2021-02-09 DIAGNOSIS — R7303 Prediabetes: Secondary | ICD-10-CM

## 2021-02-09 DIAGNOSIS — Z0000 Encounter for general adult medical examination without abnormal findings: Secondary | ICD-10-CM | POA: Diagnosis not present

## 2021-02-09 DIAGNOSIS — Z1231 Encounter for screening mammogram for malignant neoplasm of breast: Secondary | ICD-10-CM

## 2021-02-09 DIAGNOSIS — E890 Postprocedural hypothyroidism: Secondary | ICD-10-CM | POA: Diagnosis not present

## 2021-02-09 DIAGNOSIS — F411 Generalized anxiety disorder: Secondary | ICD-10-CM

## 2021-02-09 DIAGNOSIS — R350 Frequency of micturition: Secondary | ICD-10-CM

## 2021-02-09 DIAGNOSIS — E538 Deficiency of other specified B group vitamins: Secondary | ICD-10-CM

## 2021-02-09 DIAGNOSIS — E782 Mixed hyperlipidemia: Secondary | ICD-10-CM

## 2021-02-09 DIAGNOSIS — G4700 Insomnia, unspecified: Secondary | ICD-10-CM

## 2021-02-09 DIAGNOSIS — N62 Hypertrophy of breast: Secondary | ICD-10-CM | POA: Insufficient documentation

## 2021-02-09 DIAGNOSIS — E559 Vitamin D deficiency, unspecified: Secondary | ICD-10-CM | POA: Diagnosis not present

## 2021-02-09 DIAGNOSIS — M329 Systemic lupus erythematosus, unspecified: Secondary | ICD-10-CM

## 2021-02-09 DIAGNOSIS — R002 Palpitations: Secondary | ICD-10-CM | POA: Insufficient documentation

## 2021-02-09 LAB — POCT URINALYSIS DIPSTICK
Bilirubin, UA: NEGATIVE
Blood, UA: NEGATIVE
Glucose, UA: NEGATIVE
Ketones, UA: NEGATIVE
Leukocytes, UA: NEGATIVE
Nitrite, UA: NEGATIVE
Protein, UA: NEGATIVE
Spec Grav, UA: 1.01 (ref 1.010–1.025)
Urobilinogen, UA: 0.2 E.U./dL
pH, UA: 6 (ref 5.0–8.0)

## 2021-02-09 MED ORDER — ALPRAZOLAM 0.5 MG PO TABS
0.5000 mg | ORAL_TABLET | Freq: Two times a day (BID) | ORAL | 1 refills | Status: DC | PRN
Start: 2021-02-09 — End: 2022-01-15

## 2021-02-09 MED ORDER — RIZATRIPTAN BENZOATE 10 MG PO TBDP: 10.0000 mg | ORAL_TABLET | ORAL | 3 refills | Status: DC | PRN

## 2021-02-09 MED ORDER — TRAZODONE HCL 50 MG PO TABS: 25.0000 mg | ORAL_TABLET | Freq: Every evening | ORAL | 3 refills | Status: DC | PRN

## 2021-02-09 NOTE — Patient Instructions (Signed)
   The CDC recommends two doses of Shingrix (the shingles vaccine) separated by 2 to 6 months for adults age 53 years and older. I recommend checking with your insurance plan regarding coverage for this vaccine.   

## 2021-02-09 NOTE — Assessment & Plan Note (Signed)
History of Graves' disease, now with postop hypothyroidism Status post total thyroidectomy Followed by endocrinology No changes to set her Synthroid dose today, but we will recheck her TSH

## 2021-02-09 NOTE — Assessment & Plan Note (Signed)
Reviewed last lipid panel Not currently on a statin Recheck FLP and CMP Discussed diet and exercise  

## 2021-02-09 NOTE — Assessment & Plan Note (Signed)
Patient with recurrent intertrigo under her breasts, chronic neck and back pain, chronic indentations from bra straps She wears a G cup bra and is interested in breast reduction surgery Referral to plastic surgery

## 2021-02-09 NOTE — Assessment & Plan Note (Signed)
Followed by rheumatology No changes to medications

## 2021-02-09 NOTE — Assessment & Plan Note (Signed)
Recommend low carb diet °Recheck A1c  °

## 2021-02-09 NOTE — Assessment & Plan Note (Signed)
New problem Only 1 short episode Normal exam today Check labs to ensure no underlying issue Discussed with patient that I would expect this to be more likely related to her thyroid which has required multiple medication changes this year rather than her trazodone We are rechecking TSH today as above If this recurs or persists, we will consider Zio patch at that time

## 2021-02-09 NOTE — Assessment & Plan Note (Signed)
Chronic and fairly well controlled Patient has been hesitant to try SSRI/SNRI Only using low-dose Xanax very sparingly, so we will continue 

## 2021-02-09 NOTE — Assessment & Plan Note (Signed)
Chronic and stable It is well controlled when she takes her trazodone 25 mg nightly, but she is hesitant to take this See discussion below about palpitations Encouraged her to take the trazodone since it does help her sleep significantly better

## 2021-02-09 NOTE — Assessment & Plan Note (Signed)
Continue supplement Repeat level 

## 2021-02-10 LAB — COMPREHENSIVE METABOLIC PANEL
ALT: 20 IU/L (ref 0–32)
AST: 18 IU/L (ref 0–40)
Albumin/Globulin Ratio: 1.7 (ref 1.2–2.2)
Albumin: 4.5 g/dL (ref 3.8–4.9)
Alkaline Phosphatase: 68 IU/L (ref 44–121)
BUN/Creatinine Ratio: 13 (ref 9–23)
BUN: 11 mg/dL (ref 6–24)
Bilirubin Total: 0.7 mg/dL (ref 0.0–1.2)
CO2: 25 mmol/L (ref 20–29)
Calcium: 9.5 mg/dL (ref 8.7–10.2)
Chloride: 99 mmol/L (ref 96–106)
Creatinine, Ser: 0.82 mg/dL (ref 0.57–1.00)
Globulin, Total: 2.6 g/dL (ref 1.5–4.5)
Glucose: 84 mg/dL (ref 70–99)
Potassium: 3.8 mmol/L (ref 3.5–5.2)
Sodium: 138 mmol/L (ref 134–144)
Total Protein: 7.1 g/dL (ref 6.0–8.5)
eGFR: 86 mL/min/{1.73_m2} (ref >59)

## 2021-02-10 LAB — LIPID PANEL WITH LDL/HDL RATIO
Cholesterol, Total: 187 mg/dL (ref 100–199)
HDL: 39 mg/dL — ABNORMAL LOW (ref >39)
LDL Chol Calc (NIH): 118 mg/dL — ABNORMAL HIGH (ref 0–99)
LDL/HDL Ratio: 3 ratio (ref 0.0–3.2)
Triglycerides: 169 mg/dL — ABNORMAL HIGH (ref 0–149)
VLDL Cholesterol Cal: 30 mg/dL (ref 5–40)

## 2021-02-10 LAB — TSH: TSH: 0.992 u[IU]/mL (ref 0.450–4.500)

## 2021-02-10 LAB — HEMOGLOBIN A1C
Est. average glucose Bld gHb Est-mCnc: 120 mg/dL
Hgb A1c MFr Bld: 5.8 % — ABNORMAL HIGH (ref 4.8–5.6)

## 2021-02-10 LAB — VITAMIN D 25 HYDROXY (VIT D DEFICIENCY, FRACTURES): Vit D, 25-Hydroxy: 38.5 ng/mL (ref 30.0–100.0)

## 2021-02-10 LAB — VITAMIN B12: Vitamin B-12: 405 pg/mL (ref 232–1245)

## 2021-03-08 ENCOUNTER — Other Ambulatory Visit: Payer: Self-pay | Admitting: Family Medicine

## 2021-03-08 NOTE — Telephone Encounter (Signed)
Requested Prescriptions  Pending Prescriptions Disp Refills   traZODone (DESYREL) 50 MG tablet [Pharmacy Med Name: TRAZODONE 50MG  TABLETS] 30 tablet 3    Sig: TAKE 1/2 TO 1 TABLET(25 TO 50 MG) BY MOUTH AT BEDTIME AS NEEDED FOR SLEEP     Psychiatry: Antidepressants - Serotonin Modulator Passed - 03/08/2021  8:23 AM      Passed - Valid encounter within last 6 months    Recent Outpatient Visits          3 weeks ago Encounter for annual physical exam   [facility] [place], [place], MD   1 year ago Encounter for annual physical exam   [facility] [place], Kenner, MD   1 year ago Acute non-recurrent pansinusitis   [facility] [place], [place], Alessandra Bevels   1 year ago Prediabetes   [facility] [place], Ken[place], MD      Future Appointments            In 12 months Bacigalupo, Marzella Schlein, MD [facility]

## 2021-03-20 ENCOUNTER — Ambulatory Visit
Admission: RE | Admit: 2021-03-20 | Discharge: 2021-03-20 | Disposition: A | Discharge status: Home / Self Care | Payer: Managed Care, Other (non HMO) | Source: Ambulatory Visit | Attending: Family Medicine | Admitting: Family Medicine

## 2021-03-20 ENCOUNTER — Other Ambulatory Visit: Payer: Self-pay

## 2021-03-20 DIAGNOSIS — Z1231 Encounter for screening mammogram for malignant neoplasm of breast: Secondary | ICD-10-CM | POA: Insufficient documentation

## 2021-05-09 ENCOUNTER — Encounter: Payer: Self-pay | Admitting: Plastic Surgery

## 2021-05-09 ENCOUNTER — Ambulatory Visit: Payer: Managed Care, Other (non HMO) | Admitting: Plastic Surgery

## 2021-05-09 VITALS — BP 155/79 | HR 84 | Ht 67.0 in | Wt 165.0 lb

## 2021-05-09 DIAGNOSIS — G8929 Other chronic pain: Secondary | ICD-10-CM | POA: Diagnosis not present

## 2021-05-09 DIAGNOSIS — N62 Hypertrophy of breast: Secondary | ICD-10-CM | POA: Diagnosis not present

## 2021-05-09 DIAGNOSIS — M546 Pain in thoracic spine: Secondary | ICD-10-CM | POA: Diagnosis not present

## 2021-05-09 DIAGNOSIS — M549 Dorsalgia, unspecified: Secondary | ICD-10-CM | POA: Insufficient documentation

## 2021-05-09 NOTE — Progress Notes (Signed)
? ?  Patient ID: Caroline Mullen, female    DOB: [birth year], 51 y.o.   MRN: [id number] ? ? ?Chief Complaint  ?Patient presents with  ? Consult  ?   ?  ? ? ?Mammary Hyperplasia: ?The patient is a 51 y.o. female with a history of mammary hyperplasia for several years.  She has extremely large breasts causing symptoms that include the following: ?Back pain in the upper and lower back, including neck pain. She pulls or pins her bra straps to provide better lift and relief of the pressure and pain. She notices relief by holding her breast up manually.  Her shoulder straps cause grooves and pain and pressure that requires padding for relief. Pain medication is sometimes required with motrin and tylenol.  Activities that are hindered by enlarged breasts include: exercise and running.  She has tried supportive clothing as well as fitted bras without improvement. ? ?Her breasts are extremely large and fairly symmetric.  She has hyperpigmentation of the inframammary area on both sides.  The sternal to nipple distance on the right is 30 cm and the left is 30 cm.  The IMF distance is 16 cm.  She is 5 feet 7 inches tall and weighs 165 pounds.  The BMI = 25.8 kg/m?Marland Kitchen  Preoperative bra size = I/DDD cup.the patient would like to be a B/C cup.  The estimated excess breast tissue to be removed at the time of surgery = 550 grams on the left and 550 grams on the right.  Mammogram history: 1/23 negative.  Family history of breast cancer:  no.  Tobacco use:  no.   The patient expresses the desire to pursue surgical intervention. The patient had a hysterectomy, thyroid surgery and shoulder/knee surgery. She has lupus and is on two medications for it. ? ? ? ?Review of Systems  ?Constitutional:  Positive for activity change. Negative for appetite change.  ?Eyes: Negative.   ?Respiratory:  Negative for chest tightness and shortness of breath.   ?Cardiovascular:  Negative for leg swelling.  ?Gastrointestinal: Negative.  Negative for abdominal  distention.  ?Endocrine: Negative.   ?Genitourinary: Negative.   ?Musculoskeletal:  Positive for back pain and neck pain.  ?Skin:  Positive for rash.  ?Hematological: Negative.   ?Psychiatric/Behavioral: Negative.    ? ?Past Medical History:  ?Diagnosis Date  ? Anxiety   ? Connective tissue disease (HCC)   ? on Plaquenil    NO LONGER ON MEDS NO PROBLEMS X 6 YEARS  ? Graves disease   ? IRRITABLE BOWEL SYNDROME, HX OF 04/22/2006  ? Qualifier: History of  By: Milinda Antis MD, Colon Flattery   ? Lupus (HCC)   ? Migraine   ? Thyroid nodule 02/19/2011  ?  ?Past Surgical History:  ?Procedure Laterality Date  ? ABDOMINAL HYSTERECTOMY    ? fibroid uterine tumors supracervical  ? CESAREAN SECTION    ? HEMORRHOID BANDING    ? KNEE ARTHROSCOPY    ? SHOULDER SURGERY  1998  ? THYROIDECTOMY N/A 08/08/2015  ? Procedure: THYROIDECTOMY;  Surgeon: Vernie Murders, MD;  Location: [facility] ORS;  Service: ENT;  Laterality: N/A;  ? TUBAL LIGATION    ?  ? ? ?Current Outpatient Medications:  ?  ALPRAZolam (XANAX) 0.5 MG tablet, Take 1 tablet (0.5 mg total) by mouth 2 (two) times daily as needed. for anxiety, Disp: 15 tablet, Rfl: 1 ?  hydroxychloroquine (PLAQUENIL) 200 MG tablet, TAKE 1 AND 1/2 TABLETS(300 MG) BY MOUTH DAILY, Disp: , Rfl:  ?  ibuprofen (ADVIL,MOTRIN) 200 MG tablet, Take 600 mg by mouth every 6 (six) hours as needed for moderate pain., Disp: , Rfl:  ?  leflunomide (ARAVA) 10 MG tablet, TAKE 1 TABLET(10 MG) BY MOUTH DAILY, Disp: , Rfl:  ?  levothyroxine (SYNTHROID) 125 MCG tablet, Take 1 tablet (125 mcg total) by mouth daily before breakfast. (Patient taking differently: Take 100 mcg by mouth daily before breakfast.), Disp: 90 tablet, Rfl: 3 ?  Multiple Vitamin (MULTIVITAMIN) capsule, Take by mouth., Disp: , Rfl:  ?  Omega-3 Fatty Acids (FISH OIL) 1000 MG CAPS, Take by mouth., Disp: , Rfl:  ?  rizatriptan (MAXALT-MLT) 10 MG disintegrating tablet, Take 1 tablet (10 mg total) by mouth as needed for migraine. May repeat in 2 hours if needed, Disp: 10  tablet, Rfl: 3 ?  SUMAtriptan (IMITREX) 25 MG tablet, Take 1 tablet (25 mg total) by mouth every 2 (two) hours as needed for migraine., Disp: 10 tablet, Rfl: 3 ?  traZODone (DESYREL) 50 MG tablet, TAKE 1/2 TO 1 TABLET(25 TO 50 MG) BY MOUTH AT BEDTIME AS NEEDED FOR SLEEP, Disp: 30 tablet, Rfl: 3  ? ?Objective:  ? ?Vitals:  ? 05/09/21 1444  ?BP: (!) 155/79  ?Pulse: 84  ?SpO2: 98%  ? ? ?Physical Exam ?Vitals reviewed.  ?Constitutional:   ?   Appearance: Normal appearance.  ?HENT:  ?   Head: Normocephalic and atraumatic.  ?Cardiovascular:  ?   Rate and Rhythm: Normal rate.  ?   Pulses: Normal pulses.  ?Pulmonary:  ?   Effort: Pulmonary effort is normal.  ?Abdominal:  ?   General: There is no distension.  ?   Palpations: Abdomen is soft.  ?   Tenderness: There is no abdominal tenderness.  ?Musculoskeletal:     ?   General: No swelling or deformity.  ?   Cervical back: Normal range of motion.  ?Skin: ?   General: Skin is warm.  ?   Capillary Refill: Capillary refill takes less than 2 seconds.  ?   Coloration: Skin is not jaundiced.  ?   Findings: No bruising.  ?Neurological:  ?   Mental Status: She is alert and oriented to person, place, and time.  ?Psychiatric:     ?   Mood and Affect: Mood normal.     ?   Thought Content: Thought content normal.     ?   Judgment: Judgment normal.  ? ? ?Assessment & Plan:  ?Macromastia ? ?Chronic bilateral thoracic back pain ? ?The procedure the patient selected and that was best for the patient was discussed. The risk were discussed and include but not limited to the following:  Breast asymmetry, fluid accumulation, firmness of the breast, inability to breast feed, loss of nipple or areola, skin loss, change in skin and nipple sensation, fat necrosis of the breast tissue, bleeding, infection and healing delay.  There are risks of anesthesia and injury to nerves or blood vessels.  Allergic reaction to tape, suture and skin glue are possible.  There will be swelling.  Any of these can  lead to the need for revisional surgery.  A breast reduction has potential to interfere with diagnostic procedures in the future.  This procedure is best done when the breast is fully developed.  Changes in the breast will continue to occur over time: pregnancy, weight gain or weigh loss. ?   ?Total time: 45 minutes. This includes time spent with the patient during the visit as well as time spent  before and after the visit reviewing the chart, documenting the encounter, ordering pertinent studies and literature for the patient.   ?Physical therapy:  ordered ?Mammogram:  done ? ?The patient is a good candidate for bilateral breast reduction with possible lateral liposuction.  The patient knows she will have to come off of her lupus medications 1 month before and 1 month after the surgery.  ? ?Pictures were obtained of the patient and placed in the chart with the patient's or guardian's permission. ? ? ?Alena Billslaire S Sahvannah Rieser, DO ?

## 2021-05-16 ENCOUNTER — Encounter: Payer: Self-pay | Admitting: Family Medicine

## 2021-05-18 MED ORDER — LEVOCETIRIZINE DIHYDROCHLORIDE 5 MG PO TABS: 5.0000 mg | ORAL_TABLET | Freq: Every day | ORAL | 3 refills | Status: DC | PRN

## 2021-05-18 NOTE — Telephone Encounter (Signed)
Ok to refill Levocetrizine 5mg  daily #90 r3

## 2021-05-24 ENCOUNTER — Telehealth: Payer: Self-pay

## 2021-05-24 NOTE — Telephone Encounter (Signed)
Called patient, advised her surgery has been submitted to [facility] for pre-determination. ?

## 2021-05-24 NOTE — Telephone Encounter (Signed)
Faxed pre-determination to [place] ?

## 2021-05-30 ENCOUNTER — Ambulatory Visit: Payer: Managed Care, Other (non HMO) | Attending: Plastic Surgery | Admitting: Physical Therapy

## 2021-05-30 DIAGNOSIS — M62838 Other muscle spasm: Secondary | ICD-10-CM | POA: Insufficient documentation

## 2021-05-30 DIAGNOSIS — N62 Hypertrophy of breast: Secondary | ICD-10-CM | POA: Diagnosis not present

## 2021-05-30 DIAGNOSIS — M546 Pain in thoracic spine: Secondary | ICD-10-CM | POA: Insufficient documentation

## 2021-05-30 DIAGNOSIS — G8929 Other chronic pain: Secondary | ICD-10-CM | POA: Insufficient documentation

## 2021-05-30 NOTE — Therapy (Signed)
?OUTPATIENT PHYSICAL THERAPY EVALUATION ? ? ?Patient Name: Caroline Mullen ?MRN: [id number] ?DOB:Feb 11, 1970, 51 y.o., female ?Today's Date: 05/31/2021 ? ? PT End of Session - 05/30/21 1937   ? ? Visit Number 1   ? Number of Visits 6   ? Date for PT Re-Evaluation 07/25/21   ? Authorization Type [employer] reporting period from 05/30/2021   ? Authorization Time Period 30 per calendar year   ? Authorization - Number of Visits 30   ? Progress Note Due on Visit 10   ? PT Start Time 1650   ? PT Stop Time 1730   ? PT Time Calculation (min) 40 min   ? Activity Tolerance Patient tolerated treatment well   ? Behavior During Therapy [facility] for tasks assessed/performed   ? ?  ?  ? ?  ? ? ?Past Medical History:  ?Diagnosis Date  ? Anxiety   ? Connective tissue disease (HCC)   ? on Plaquenil    NO LONGER ON MEDS NO PROBLEMS X 6 YEARS  ? Graves disease   ? IRRITABLE BOWEL SYNDROME, HX OF 04/22/2006  ? Qualifier: History of  By: Milinda Antis MD, Colon Flattery   ? Lupus (HCC)   ? Migraine   ? Thyroid nodule 02/19/2011  ? ?Past Surgical History:  ?Procedure Laterality Date  ? ABDOMINAL HYSTERECTOMY    ? fibroid uterine tumors supracervical  ? CESAREAN SECTION    ? HEMORRHOID BANDING    ? KNEE ARTHROSCOPY    ? SHOULDER SURGERY  1998  ? THYROIDECTOMY N/A 08/08/2015  ? Procedure: THYROIDECTOMY;  Surgeon: Vernie Murders, MD;  Location: [facility] ORS;  Service: ENT;  Laterality: N/A;  ? TUBAL LIGATION    ? ?Patient Active Problem List  ? Diagnosis Date Noted  ? Back pain 05/09/2021  ? Insomnia 02/09/2021  ? Palpitations 02/09/2021  ? Macromastia 02/09/2021  ? Prediabetes 03/20/2019  ? Postoperative hypothyroidism 08/08/2015  ? B12 deficiency 11/17/2010  ? Migraine 04/04/2010  ? Avitaminosis D 02/25/2008  ? HYPERLIPIDEMIA 11/22/2006  ? GAD (generalized anxiety disorder) 04/22/2006  ? SLE (systemic lupus erythematosus) (HCC) 04/22/2006  ? ? ?PCP:  Erasmo Downer, MD ? ?REFERRING PROVIDER: Peggye Form, DO   ? ?REFERRING DIAG: macromastia, chronic bilateral  thoracic back pain ? ?THERAPY DIAG:  ?Pain in thoracic spine ? ?Other muscle spasm ? ?ONSET DATE: years ? ?SUBJECTIVE:                                                                                                                                                                                          ? ?SUBJECTIVE STATEMENT: ?Patient reports she is coming to  PT because hse has continual stress over the top of her shoulders and and over her upper back and base of neck. She has this pain no matter what type of bra she wears. She used to see a chiropractor to help with it but he retired. Chiropractic helped temporarily but not for a long time. She does have tingling in the top of her shoulders lateral to her bra-strap. On her worst days her pain is so bad that when she gets home from work she takes her bra off and avoids going out even if someone wants to go out to dinner or if her daughter calls her to go shopping. She has permanent indentations in the tops of her shoulders where her bra straps rest. Patient states she feels that as she has gotten older her ability to ignore the pain and work through it has gotten less. She currently wears and I sized bra.  ? ?PERTINENT HISTORY:  ?Patient is a 51 y.o. female who presents to outpatient physical therapy with a referral for medical diagnosis macromastia, chronic bilateral thoracic back pain. This patient's chief complaints consist of chronic thoracic spine pain and pain over the base of the neck, B UT, and top of shoulders associated with wearing her bra that supports her large breasts leading to the following functional deficits: macromastia, chronic bilateral thoracic back pain. Relevant past medical history and comorbidities include lupus, graves disease, migraines, thyroidectomy, hx of knee arthroscopy (no problems now), hx of shoulder surgery (does not give her problems now), generalized anxiety, former smoker.   Patient denies hx of cancer, stroke, seizures, lung  problems, heart problems, diabetes, unexplained weight loss, unexplained changes in bowel or bladder problems, unexplained stumbling or dropping things, osteoporosis, and spinal surgery ?macromastia, chronic bilateral thoracic back pain ?PAIN:  ?Are you having pain? Not currently: NPRS scale: Current: 0/10,  Best: 0/10, Worst: 6/10. ?Pain location: upper traps, upper back, base of spine, tops of shoulders.  ?Pain description: tight, paresthesia lateral to bra straps at shoulders, "it just hurts," digging pain into shoulders.  ?Aggravating factors: wearing a bra, any type of physical activity, jogging, running, any high impact activity, increases the length of time she is wearing a bra, evenings.  ?Relieving factors: taking off the bra, chiropractic (mild improvement and temporary).  ? ?FUNCTIONAL LIMITATIONS: difficulty with work, social activities, anything that requires prolonged wearing of her bra, exercise for health and fitness, running, high impact movements ? ?PRECAUTIONS: None ? ?WEIGHT BEARING RESTRICTIONS No ? ?FALLS:  ?Has patient fallen in last 6 months? No ? ?LIVING ENVIRONMENT: ?No concerns about getting around home safely ? ?OCCUPATION: full time preschool assistant ? ?LEISURE: go shopping, spend time with daughter, go out to eat, camping.  ? ?PLOF: Independent ? ?PATIENT GOALS "to feel better" "pain management" to exhaust conservative care prior to getting surgical intervention.  ? ?OBJECTIVE ? ?SELF- REPORTED FUNCTION ?FOTO score: 60/100 (thoracic spine questionnaire) ? ?OBSERVATION/INSPECTION ?Posture ?Posture (seated): slightly forward head, upright sitting. Deep indentations noted under each bra strap. Noted for large breasts for frame.  ?Anthropometrics ?Tremor: none ?Body composition: BMI: 25.8 ?Muscle bulk: gross assessment reveals no gross asymmetry.  ?Skin: Deep indentations noted under each bra strap.  ?Edema: none ?Functional Mobility ?Bed mobility: supine <> sit and rolling I ?Transfers:  sit <> stand I ?Gait: grossly WFL for household and short community ambulation. More detailed gait analysis deferred to later date as needed.  ? ? ?SPINE MOTION (feels stretching of tightness for all motions but  no pain) ? ?Cervical Spine AROM (inclinometer at T1) ?*Indicates pain ?Flexion: 30 ?Extension: 15 ?Side Flexion:  ? R 45 ? L 40 ?Rotation:  ?R 60 ?L 56 ? ?Thoracic Spine AROM ?*Indicates pain ?Flexion: 60 ?Extension: 70 ?Side Flexion:  ? R 47 ? L 45 ?Rotation:  ?R 70 ?L 76 ? ?PERIPHERAL JOINT MOTION (in degrees) ?Active Range of Motion (AROM) ?B UE grossly WNL ? ?MUSCLE PERFORMANCE (MMT):  ?*Indicates pain 05/30/21 Date Date  ?Joint/Motion R/L R/L R/L  ?Shoulder     ?Flexion 5/5 / /  ?Abduction (C5) 5/5 / /  ?External rotation 4/4 / /  ?Internal rotation 5/5 / /  ?Elbow     ?Flexion (C6) 5/5 / /  ?Extension (C7) 5/5 / /  ?Hand     ?Thumb extension (C8) 4+/4+ / /  ?Finger abduction (T1) B WFL / /  ?Comments:  ? ?SPECIAL TESTS: ?CERVICAL SPINE ?Cervical spine axial compression: negative ?Spurling's part B:  ?R = negative, L = negative ?Cervical spine axial distraction: negative ? ? ?ACCESSORY MOTION:  ?CPA to upper to mid thoracic spine tender with mild concordant upper back pain.  ? ?PALPATION: ?TTP and tight at bilateral thoracic paraspinals, rhomboids, upper traps, right posterior rotator cuff region.  ? ?TODAY'S TREATMENT  ?Therapeutic exercise: to centralize symptoms and improve ROM, strength, muscular endurance, and activity tolerance required for successful completion of functional activities.  ?- Sidelying open book (thoracic rotation) to improve thoracic, shoulder girdle, and upper trunk mobility. 1x10 each side.  ?- standing B shoulder ER with palms up and scapular retraction, red then green theraband, 1x10 ?- scapular row with green theraband, 1x5-10 ?- Education on diagnosis, prognosis, POC, anatomy and physiology of current condition.  ?- Education on HEP including handout  ? ?Pt required multimodal  cuing for proper technique and to facilitate improved neuromuscular control, strength, range of motion, and functional ability resulting in improved performance and form. ? ? ?PATIENT EDUCATION:  ?Ed

## 2021-05-31 ENCOUNTER — Encounter: Payer: Self-pay | Admitting: Physical Therapy

## 2021-06-05 NOTE — Therapy (Incomplete)
?OUTPATIENT PHYSICAL THERAPY TREATMENT NOTE ? ? ?Patient Name: Caroline Mullen ?MRN: [id number] ?DOB:[birth year], 51 y.o., female ?Today's Date: 06/05/2021 ? ?PCP: Virginia Crews, MD ?REFERRING PROVIDER: Wallace Going, DO   ? ?END OF SESSION:  ? ? ?Past Medical History:  ?Diagnosis Date  ? Anxiety   ? Connective tissue disease ([place])   ? on Plaquenil    NO LONGER ON MEDS NO PROBLEMS X 6 YEARS  ? Graves disease   ? IRRITABLE BOWEL SYNDROME, HX OF 04/22/2006  ? Qualifier: History of  By: Glori Bickers MD, Carmell Austria   ? Lupus ([place])   ? Migraine   ? Thyroid nodule 02/19/2011  ? ?Past Surgical History:  ?Procedure Laterality Date  ? ABDOMINAL HYSTERECTOMY    ? fibroid uterine tumors supracervical  ? CESAREAN SECTION    ? HEMORRHOID BANDING    ? KNEE ARTHROSCOPY    ? SHOULDER SURGERY  1998  ? THYROIDECTOMY N/A 08/08/2015  ? Procedure: THYROIDECTOMY;  Surgeon: Margaretha Sheffield, MD;  Location: [facility] ORS;  Service: ENT;  Laterality: N/A;  ? TUBAL LIGATION    ? ?Patient Active Problem List  ? Diagnosis Date Noted  ? Back pain 05/09/2021  ? Insomnia 02/09/2021  ? Palpitations 02/09/2021  ? Macromastia 02/09/2021  ? Prediabetes 03/20/2019  ? Postoperative hypothyroidism 08/08/2015  ? B12 deficiency 11/17/2010  ? Migraine 04/04/2010  ? Avitaminosis D 02/25/2008  ? HYPERLIPIDEMIA 11/22/2006  ? GAD (generalized anxiety disorder) 04/22/2006  ? SLE (systemic lupus erythematosus) ([place]) 04/22/2006  ? ? ?REFERRING DIAG: macromastia, chronic bilateral thoracic back pain ? ?THERAPY DIAG:  ?No diagnosis found. ? ?ONSET DATE: years ? ?PERTINENT HISTORY: Patient is a 51 y.o. female who presents to outpatient physical therapy with a referral for medical diagnosis macromastia, chronic bilateral thoracic back pain. This patient's chief complaints consist of chronic thoracic spine pain and pain over the base of the neck, B UT, and top of shoulders associated with wearing her bra that supports her large breasts leading to the following functional  deficits: macromastia, chronic bilateral thoracic back pain. Relevant past medical history and comorbidities include lupus, graves disease, migraines, thyroidectomy, hx of knee arthroscopy (no problems now), hx of shoulder surgery (does not give her problems now), generalized anxiety, former smoker.   Patient denies hx of cancer, stroke, seizures, lung problems, heart problems, diabetes, unexplained weight loss, unexplained changes in bowel or bladder problems, unexplained stumbling or dropping things, osteoporosis, and spinal surgery. ? ? ?PRECAUTIONS: none ? ?SUBJECTIVE: *** ? ?PAIN:  ?Are you having pain? Yes: NPRS scale:  ? ? ? ?OBJECTIVE:  ? ?TODAY'S TREATMENT  ?Therapeutic exercise: to centralize symptoms and improve ROM, strength, muscular endurance, and activity tolerance required for successful completion of functional activities.  ?- Sidelying open book (thoracic rotation) to improve thoracic, shoulder girdle, and upper trunk mobility. 1x10 each side.  ?- standing B shoulder ER with palms up and scapular retraction, red then green theraband, 1x10 ?- scapular row with green theraband, 1x5-10 ?- Education on diagnosis, prognosis, POC, anatomy and physiology of current condition.  ?- Education on HEP including handout  ?  ?Pt required multimodal cuing for proper technique and to facilitate improved neuromuscular control, strength, range of motion, and functional ability resulting in improved performance and form. ?  ?  ?PATIENT EDUCATION:  ?Education details: Exercise purpose/form. Self management techniques. Education on diagnosis, prognosis, POC, anatomy and physiology of current condition. Education on HEP including handout  ?Person educated: Patient ?Education method: Explanation, Demonstration, Tactile cues,  Verbal cues, and Handouts ?Education comprehension: verbalized understanding, returned demonstration, verbal cues required, tactile cues required, and needs further education ?  ?  ?HOME EXERCISE  PROGRAM: ?Access Code: [email or web address] ?URL: [email or web address] ?Date: 05/30/2021 ?Prepared by: Rosita Kea ?  ?Exercises ?- Sidelying Thoracic Rotation with Open Book  - 1 x daily - 1 sets - 20 reps - 5 seconds hold ?- Row with band/cable  - 1 x daily - 3 sets - 10 reps - 2 seconds hold ?- Shoulder External Rotation and Scapular Retraction with Resistance  - 1 x daily - 3 sets - 10 reps ?  ?ASSESSMENT: ?  ?CLINICAL IMPRESSION: ?Patient is a 51 y.o. female referred to outpatient physical therapy with a medical diagnosis of macromastia, chronic bilateral thoracic back pain who presents with signs and symptoms consistent with chronic thoracic spine pain, lower cervical spine pain and proximal upper quarter pain associated with abnormal strain placed on these regions due to excessive breast size.  Patient presents with significant pain, muscle tension, joint stiffness, headache impairments that are limiting ability to complete her usual activities such as work, social activities, anything that requires prolonged wearing of her bra, exercise for health and fitness, running, high impact movements without difficulty. Examination reveals pain and muscle tension pattern with result of chronic mechanical force of large breasts in transferred through bra as the primary contributor to symptoms. Patient may benefit from postural strengthening and interventions to decrease muscle tension and pain, but relief is likely to be short term without addressing the underlying mechanical stress from abnormally large breasts which PT is unable to do. Patient will benefit from short term trial of PT for symptom relief and improved postural strength, but plan to refer back to MD for further medical assessment if no lasting improvement is seen after 4-6 weeks of participation in PT. Patient will benefit from skilled physical therapy intervention to address current body structure impairments and activity limitations to improve  function and work towards goals set in current POC in order to return to prior level of function or maximal functional improvement.  ?  ?OBJECTIVE IMPAIRMENTS decreased activity tolerance, decreased endurance, hypomobility, increased muscle spasms, and pain.  ?  ?ACTIVITY LIMITATIONS community activity, occupation, and    usual activities including work, social activities, anything that requires prolonged wearing of her bra, exercise for health and fitness, running, high impact movements.   ?  ?PERSONAL FACTORS Past/current experiences, Time since onset of injury/illness/exacerbation, and 3+ comorbidities:   lupus, graves disease, migraines, thyroidectomy, hx of knee arthroscopy (no problems now), hx of shoulder surgery (does not give her problems now), generalized anxiety, former smoker are also affecting patient's functional outcome.  ?  ?  ?REHAB POTENTIAL: Fair , PT is unable to address underlying mechanical stress from excessive size of breasts.  ?  ?CLINICAL DECISION MAKING: Stable/uncomplicated ?  ?EVALUATION COMPLEXITY: Low ?  ?  ?GOALS: ?Goals reviewed with patient? No ?  ?SHORT TERM GOALS: Target date: 06/13/2021 ?  ?Patient will be independent with initial home exercise program for self-management of symptoms. ?Baseline: Initial HEP provided at IE (05/30/21); ?Goal status: In-progress ?  ?  ?LONG TERM GOALS: Target date: 07/25/2021 ?  ?Patient will be independent with a long-term home exercise program for self-management of symptoms.  ?Baseline: Initial HEP provided at IE (05/31/21); ?Goal status: In-progress ?  ?2.  Patient will demonstrate improved FOTO to equal or greater than 66 to demonstrate improvement in overall condition and self-reported functional ability.  ?Baseline: 60 (  05/30/21); ?Goal status: In-progress ?  ?3.  Patient will report pain of equal or less than 1/10 during functional activities to improve her ability to socialize in the evenings and complete work activities.  ?Baseline: 6/10  (05/30/21); ?Goal status: In-progress ?  ?4.  Patient will be able to jog for 10 min without increased pain to improve her ability to jog for exercise and complete high impact workouts as desired.  ?Baseline: report

## 2021-06-06 ENCOUNTER — Ambulatory Visit: Payer: Managed Care, Other (non HMO) | Admitting: Physical Therapy

## 2021-06-13 ENCOUNTER — Encounter: Payer: Managed Care, Other (non HMO) | Admitting: Physical Therapy

## 2021-06-20 ENCOUNTER — Encounter: Payer: Managed Care, Other (non HMO) | Admitting: Physical Therapy

## 2021-06-23 ENCOUNTER — Encounter: Payer: Self-pay | Admitting: Plastic Surgery

## 2021-07-29 HISTORY — PX: BREAST REDUCTION SURGERY: SHX8

## 2021-08-08 ENCOUNTER — Encounter: Payer: Self-pay | Admitting: Student

## 2021-08-08 ENCOUNTER — Ambulatory Visit (INDEPENDENT_AMBULATORY_CARE_PROVIDER_SITE_OTHER): Payer: Managed Care, Other (non HMO) | Admitting: Student

## 2021-08-08 VITALS — BP 123/75 | HR 71 | Ht 67.0 in | Wt 161.0 lb

## 2021-08-08 DIAGNOSIS — N62 Hypertrophy of breast: Secondary | ICD-10-CM

## 2021-08-08 MED ORDER — VITAMIN A 3 MG (10000 UNIT) PO CAPS: 10000.0000 [IU] | ORAL_CAPSULE | Freq: Every day | ORAL | 0 refills | Status: AC

## 2021-08-08 NOTE — Progress Notes (Signed)
Patient ID: [name], female    DOB: 1970-03-08, 51 y.o.   MRN: [id number]  No chief complaint on file.   No diagnosis found.   History of Present Illness: Caroline Mullen is a 51 y.o.  female  with a history of macromastia.  She presents for preoperative evaluation for upcoming procedure, Bilateral Breast Reduction with possible liposuction, scheduled for 08/14/2021 with Dr.  Ulice Bold  The patient has had problems with anesthesia.  Patient reports she gets nauseous with anesthesia.  Patient denies any personal or family history of breast cancer.  She denies any history of cardiac disease.  She denies taking any blood thinners.  Patient states she is not a smoker.  Patient denies being on any birth control or hormone replacement.  She denies any history of miscarriages.  She denies any personal or family history of blood clots or clotting diseases.  She denies any recent traumas, surgeries, infections, strokes or heart attacks.  She denies any ulcerative colitis, Crohn's disease, COPD or asthma.  Summary of Previous Visit: Patient was seen in the clinic on 05/09/2021 with Dr. Ulice Bold.  At this visit, she was complaining of back pain, neck pain and her activities were inhibited by her enlarged breasts such as exercise and running.  Patient's STN was 30 cm bilaterally.  The IMF distance is 16 centimeters.  Her BMI is 25.8 kg/m.  Her preoperative bra size was a DDD cup.  Patient reports she would like to be a B/C cup, preferably a B cup.   Estimated excess breast tissue to be removed at time of surgery: 550 g grams  Job: Manufacturing systems engineer, currently on summer break  PMH Significant for: Hypothyroid, lupus, migraine  Patient reports she no longer takes her leflunomide.  She states she is only taking her Plaquenil for her lupus.    Past Medical History: Allergies: Allergies  Allergen Reactions   Morphine Hives    REACTION: rash    Current Medications:  Current  Outpatient Medications:    ALPRAZolam (XANAX) 0.5 MG tablet, Take 1 tablet (0.5 mg total) by mouth 2 (two) times daily as needed. for anxiety, Disp: 15 tablet, Rfl: 1   hydroxychloroquine (PLAQUENIL) 200 MG tablet, TAKE 1 AND 1/2 TABLETS(300 MG) BY MOUTH DAILY, Disp: , Rfl:    ibuprofen (ADVIL,MOTRIN) 200 MG tablet, Take 600 mg by mouth every 6 (six) hours as needed for moderate pain., Disp: , Rfl:    leflunomide (ARAVA) 10 MG tablet, TAKE 1 TABLET(10 MG) BY MOUTH DAILY, Disp: , Rfl:    levocetirizine (XYZAL) 5 MG tablet, Take 1 tablet (5 mg total) by mouth daily as needed., Disp: 90 tablet, Rfl: 3   levothyroxine (SYNTHROID) 125 MCG tablet, Take 1 tablet (125 mcg total) by mouth daily before breakfast. (Patient taking differently: Take 100 mcg by mouth daily before breakfast.), Disp: 90 tablet, Rfl: 3   Multiple Vitamin (MULTIVITAMIN) capsule, Take by mouth., Disp: , Rfl:    Omega-3 Fatty Acids (FISH OIL) 1000 MG CAPS, Take by mouth., Disp: , Rfl:    rizatriptan (MAXALT-MLT) 10 MG disintegrating tablet, Take 1 tablet (10 mg total) by mouth as needed for migraine. May repeat in 2 hours if needed, Disp: 10 tablet, Rfl: 3   SUMAtriptan (IMITREX) 25 MG tablet, Take 1 tablet (25 mg total) by mouth every 2 (two) hours as needed for migraine., Disp: 10 tablet, Rfl: 3   traZODone (DESYREL) 50 MG tablet, TAKE 1/2 TO 1 TABLET(25 TO 50  MG) BY MOUTH AT BEDTIME AS NEEDED FOR SLEEP, Disp: 30 tablet, Rfl: 3  Past Medical Problems: Past Medical History:  Diagnosis Date   Anxiety    Connective tissue disease (HCC)    on Plaquenil    NO LONGER ON MEDS NO PROBLEMS X 6 YEARS   Graves disease    IRRITABLE BOWEL SYNDROME, HX OF 04/22/2006   Qualifier: History of  By: Tower MD, Colon Flattery    Lupus [facility])    Migraine    Thyroid nodule 02/19/2011    Past Surgical History: Past Surgical History:  Procedure Laterality Date   ABDOMINAL HYSTERECTOMY     fibroid uterine tumors supracervical   CESAREAN SECTION      HEMORRHOID BANDING     KNEE ARTHROSCOPY     SHOULDER SURGERY  1998   THYROIDECTOMY N/A 08/08/2015   Procedure: THYROIDECTOMY;  Surgeon: Vernie Murders, MD;  Location: [facility] ORS;  Service: ENT;  Laterality: N/A;   TUBAL LIGATION      Social History: Social History   Socioeconomic History   Marital status: Married    Spouse name: Not on file   Number of children: 2   Years of education: Not on file   Highest education level: Not on file  Occupational History   Not on file  Tobacco Use   Smoking status: Former    Packs/day: 0.25    Years: 15.00    Total pack years: 3.75    Types: Cigarettes    Quit date: 12/28/2007    Years since quitting: 13.6   Smokeless tobacco: Never  Vaping Use   Vaping Use: Never used  Substance and Sexual Activity   Alcohol use: No   Drug use: No   Sexual activity: Yes    Partners: Male    Birth control/protection: Surgical  Other Topics Concern   Not on file  Social History Narrative   Not on file   Social Determinants of Health   Financial Resource Strain: Not on file  Food Insecurity: Not on file  Transportation Needs: Not on file  Physical Activity: Not on file  Stress: Not on file  Social Connections: Not on file  Intimate Partner Violence: Not on file    Family History: Family History  Problem Relation Age of Onset   Cancer Father        skin CA Basal cell died 2002-06-13   Diabetes Mother    Hypertension Mother    Hyperlipidemia Mother    Hypertension Maternal Grandmother    Hyperlipidemia Maternal Grandmother    Lung cancer Maternal Grandfather    Breast cancer Neg Hx    Colon cancer Neg Hx     Review of Systems: Denies fevers, chills or changes in interim health  Physical Exam: Vital Signs There were no vitals taken for this visit.  Physical Exam  Constitutional:      General: Not in acute distress.    Appearance: Normal appearance. Not ill-appearing.  HENT:     Head: Normocephalic and atraumatic.  Eyes:     Pupils:  Pupils are equal, round Neck:     Musculoskeletal: Normal range of motion.  Cardiovascular:     Rate and Rhythm: Normal rate Pulmonary:     Effort: Pulmonary effort is normal. No respiratory distress.  Musculoskeletal: Normal range of motion.  Lower extremities: No varicose veins noted, lower extremities are nonedematous bilaterally. Skin:    General: Skin is warm and dry.     Findings: No erythema or  rash.  Neurological:     Mental Status: Alert and oriented to person, place, and time. Mental status is at baseline.  Psychiatric:        Mood and Affect: Mood normal.        Behavior: Behavior normal.    Assessment/Plan: The patient is scheduled for bilateral breast reduction with Dr. Ulice Bold.  Risks, benefits, and alternatives of procedure discussed, questions answered and consent obtained.    Smoking Status: Nonsmoker; Counseling Given?  N/A Last Mammogram: 03/20/2021; Results: Negative, BI-RADS Category 1  Caroline Mullen: 4; Risk Factors include: age, , BMI > 25, and length of planned surgery. Recommendation for mechanical prophylaxis. Encourage early ambulation.   Pictures obtained: 05/09/21  Post-op Rx sent to pharmacy:  Vitamin A  I discussed the patient's situation with Dr. Ulice Bold.  I discussed that the patient has been taking Plaquenil, and that her surgery is scheduled for less than a week from now.  Plan is to start patient on vitamin A and have her hold the Plaquenil from now until 2 weeks after surgery.  I discussed the plan with the patient.  I discussed with the patient that being on the Plaquenil this close to surgery increases her chances of wound healing issues greatly postoperatively.  After discussing the risks regarding her wound healing and her Plaquenil, patient is considering rescheduling her surgery for another time to hold her Plaquenil for longer prior to surgery.  Patient states that she will give [place] a decision by tomorrow.  I sent the vitamin A in the  event that the patient still wants to have her surgery, so that she may start the vitamin A immediately.  I will send the other postoperative medications if she decides to have her surgery at this time.  I instructed the patient to also hold ibuprofen and any multivitamins, supplements or herbal teas from now until surgery.  Patient was provided with the breast reduction and General Surgical Risk consent document and Pain Medication Agreement prior to their appointment.  They had adequate time to read through the risk consent documents and Pain Medication Agreement. We also discussed them in person together during this preop appointment. All of their questions were answered to their satisfaction.  Recommended calling if they have any further questions.  Risk consent form and Pain Medication Agreement to be scanned into patient's chart.  The risk that can be encountered with breast reduction were discussed and include the following but not limited to these:  Breast asymmetry, fluid accumulation, firmness of the breast, inability to breast feed, loss of nipple or areola, skin loss, decrease or no nipple sensation, fat necrosis of the breast tissue, bleeding, infection, healing delay.  There are risks of anesthesia, changes to skin sensation and injury to nerves or blood vessels.  The muscle can be temporarily or permanently injured.  You may have an allergic reaction to tape, suture, glue, blood products which can result in skin discoloration, swelling, pain, skin lesions, poor healing.  Any of these can lead to the need for revisonal surgery or stage procedures.  A reduction has potential to interfere with diagnostic procedures.  Nipple or breast piercing can increase risks of infection.  This procedure is best done when the breast is fully developed.  Changes in the breast will continue to occur over time.  Pregnancy can alter the outcomes of previous breast reduction surgery, weight gain and weigh loss can  also effect the long term appearance.     Electronically signed  by: Laurena Spies, PA-C 08/08/2021 7:59 AM

## 2021-08-09 ENCOUNTER — Telehealth: Payer: Self-pay | Admitting: Student

## 2021-08-09 NOTE — Telephone Encounter (Signed)
Called patient to discuss her decision regarding surgery.  Patient reports she would like to move forward with surgery at this time on the scheduled date.  She states she has been holding her Plaquenil.  She reports she will start taking the vitamin A today.  I instructed the patient to call if she has any questions or concerns.  I discussed this with Dr. Ulice Bold.

## 2021-08-10 ENCOUNTER — Telehealth: Payer: Self-pay | Admitting: *Deleted

## 2021-08-10 ENCOUNTER — Other Ambulatory Visit: Payer: Self-pay | Admitting: Student

## 2021-08-10 MED ORDER — OXYCODONE HCL 5 MG PO TABS: 5.0000 mg | ORAL_TABLET | Freq: Three times a day (TID) | ORAL | 0 refills | Status: DC | PRN

## 2021-08-10 MED ORDER — ONDANSETRON HCL 4 MG PO TABS: 4.0000 mg | ORAL_TABLET | Freq: Three times a day (TID) | ORAL | 0 refills | Status: DC | PRN

## 2021-08-10 MED ORDER — CEPHALEXIN 500 MG PO CAPS: 500.0000 mg | ORAL_CAPSULE | Freq: Four times a day (QID) | ORAL | 0 refills | Status: AC

## 2021-08-10 NOTE — Telephone Encounter (Signed)
Pt called to check to see if provider had seen mychart message sent by pt regarding pre-op medication prescriptions. Advised pt that provider was not in clinic but I would forward message to her. Message forwarded to Caroline More, PA-C.

## 2021-08-14 DIAGNOSIS — Z719 Counseling, unspecified: Secondary | ICD-10-CM

## 2021-08-25 ENCOUNTER — Encounter: Payer: Managed Care, Other (non HMO) | Admitting: Student

## 2021-08-29 ENCOUNTER — Ambulatory Visit (INDEPENDENT_AMBULATORY_CARE_PROVIDER_SITE_OTHER): Payer: Self-pay | Admitting: Student

## 2021-08-29 DIAGNOSIS — N62 Hypertrophy of breast: Secondary | ICD-10-CM

## 2021-08-29 NOTE — Progress Notes (Cosign Needed Addendum)
Patient is a 51 year old female with history of mammary hyperplasia.  Patient underwent bilateral breast reduction on 08/14/2021 with Dr. Ulice Bold.  Patient presents to the clinic for postoperative follow-up.  Today, patient reports she is doing well.  She reports the dressings over the incisions are bothering her.  Otherwise patient has no new complaints or concerns.  She denies any fevers or chills.  Patient reports she is overall happy with her result so far.  On exam, patient is sitting upright in no acute distress.  Her breasts are symmetric and soft.  There is some mild bruising noted medially and near the NAC's bilaterally.  There is some minor swelling noted to the breast bilaterally.  There is no overlying erythema.  NAC's appear viable bilaterally.  Aquacel Ag in place.  This was removed, and the Steri-Strips came off with it.  Incision is intact.  There are no wounds noted over the incision.  There are no cellulitic changes to the skin or surrounding erythema.  Steri-Strips were replaced with new Steri Strips.   I discussed with the patient that she can transition from her breast binder to a compressive sports bra.  I discussed with the patient that she should leave the Steri-Strips in place, and these will fall off on their own.  I discussed with the patient to avoid any strenuous activities or heavy lifting.  Patient knowledge.  Instructed patient to call if she has any questions or concerns.  Patient to follow-up next week for her scheduled appointment.

## 2021-09-01 ENCOUNTER — Ambulatory Visit (INDEPENDENT_AMBULATORY_CARE_PROVIDER_SITE_OTHER): Payer: Self-pay | Admitting: Student

## 2021-09-01 ENCOUNTER — Telehealth: Payer: Self-pay

## 2021-09-01 DIAGNOSIS — N62 Hypertrophy of breast: Secondary | ICD-10-CM

## 2021-09-01 NOTE — Progress Notes (Signed)
Patient is a 51 year old female with history of mammary hyperplasia.  Patient underwent bilateral breast reduction on 08/14/2021 with Dr. Ulice Bold.  Patient presents to the clinic for concern for drainage.   Patient called the office this morning with concerns of drainage to her left breast.  I called the patient this morning and she stated that last night the Steri-Strip to her inferior aspect of the left breast incision was saturated with a clearish and a little bit of blood.  She denied any purulent drainage or swelling in the area.  She denied any fevers or chills.  Patient expressed that she would like to be seen in the clinic.  Patient seen in the clinic later this afternoon.  She states she has been doing well since we spoke on the phone this morning.  She continues to deny fevers or chills.  She denies any change in her symptoms since we talked on the phone.  Patient also reports some dry skin near her NAC's bilaterally.  Chaperone present on exam.  On exam, patient sitting upright in no acute distress.  Her breasts are symmetric and soft bilaterally.  There is some minimal ecchymosis to the skin between the breast.  This is similar to her exam on Tuesday.  NAC's appear viable bilaterally.  Incisions are intact with Steri-Strips to the right breast.  To the left breast, there is a small 1 cm x 0.25 cm wound to the inferior aspect of her incision.  There was very minimal blood noted on the gauze.  There are no cellulitic changes to the skin, purulent drainage, or swelling.  Incision is otherwise intact with Steri-Strips.  I discussed with the patient that she may put a small amount of Vaseline and gauze daily over the wound.  I recommended that she should avoid tapes or adhesives, as these may irritate her skin.  Also discussed she can change the gauze as needed for drainage.  I discussed with the patient that she may put a small amount of Vaseline around her NAC's bilaterally.  Discussed with  patient to continue compression.  Instructed patient to call back with any questions or concerns.  Instructed her to call back if she notices any redness, purulent drainage, swelling or worsening symptoms.  Patient expressed understanding.  Patient to follow-up next Friday.

## 2021-09-01 NOTE — Telephone Encounter (Signed)
Patient was seen on Monday and had her big bandages removed, but noticed that its leaking a little and feels like its going to open. Wondering what she needs to do.

## 2021-09-05 ENCOUNTER — Encounter: Payer: Managed Care, Other (non HMO) | Admitting: Plastic Surgery

## 2021-09-07 NOTE — Progress Notes (Signed)
Patient is a 51 year old female with history of mammary hyperplasia.  Patient underwent bilateral breast reduction on 08/14/2021 with Dr. Ulice Bold.  Patient presents to the clinic for postoperative follow up.   Patient was last seen in the clinic on 09/01/2021.  At this visit, she had concerns regarding some drainage she was having to the inferior aspect of her left breast incision.  Patient reports she has been applying Vaseline and gauze to the left breast wound, feels that this has been going well.  She does report having a little bit of pinpoint drainage on the gauze every a.m.  She has some questions about her breast size, she reports her goal was to be about a B cup.  She asked for my opinion about cup size, I think at this time she is probably a small D cup based on appearance, we did not measure though.  We discussed that she is still early in her postop recovery and may have some additional swelling still present.  Chaperone present on exam On exam bilateral NAC's are viable, bilateral breast incisions appear intact.  She does have some Steri-Strips still in place.  She has bruising along the right medial breast.  I do not appreciate any open wounds or active drainage at this time.  No erythema or cellulitic changes noted.  No subcutaneous fluid collection noted palpation.  Recommend continue with compressive garments for approximately 3 more weeks.  Continue to avoid heavy lifting or strenuous activities for 3 more weeks.  We discussed Steri-Strips to be removed at any point during this week.  I do not see any signs of infection on exam.  All of her questions were answered to her content.  We will plan to see her back in 3 to 4 weeks for reevaluation.  We will plan to take pictures at that time.  She can restart her Plaquenil.

## 2021-09-08 ENCOUNTER — Encounter: Payer: Self-pay | Admitting: Student

## 2021-09-08 ENCOUNTER — Ambulatory Visit (INDEPENDENT_AMBULATORY_CARE_PROVIDER_SITE_OTHER): Payer: Self-pay | Admitting: Surgical

## 2021-09-08 DIAGNOSIS — M546 Pain in thoracic spine: Secondary | ICD-10-CM

## 2021-09-08 DIAGNOSIS — N62 Hypertrophy of breast: Secondary | ICD-10-CM

## 2021-09-08 DIAGNOSIS — G8929 Other chronic pain: Secondary | ICD-10-CM

## 2021-09-20 ENCOUNTER — Encounter: Payer: Self-pay | Admitting: Physician Assistant

## 2021-09-20 ENCOUNTER — Ambulatory Visit
Admission: RE | Admit: 2021-09-20 | Discharge: 2021-09-20 | Disposition: A | Discharge status: Home / Self Care | Payer: Managed Care, Other (non HMO) | Attending: Physician Assistant | Admitting: Physician Assistant

## 2021-09-20 ENCOUNTER — Ambulatory Visit
Admission: RE | Admit: 2021-09-20 | Discharge: 2021-09-20 | Disposition: A | Discharge status: Home / Self Care | Payer: Managed Care, Other (non HMO) | Source: Ambulatory Visit | Attending: Physician Assistant | Admitting: *Deleted

## 2021-09-20 ENCOUNTER — Ambulatory Visit: Payer: Managed Care, Other (non HMO) | Admitting: Physician Assistant

## 2021-09-20 ENCOUNTER — Ambulatory Visit: Payer: Self-pay

## 2021-09-20 VITALS — BP 127/71 | HR 76 | Temp 98.2°F | Resp 16 | Wt 154.0 lb

## 2021-09-20 DIAGNOSIS — R1012 Left upper quadrant pain: Secondary | ICD-10-CM

## 2021-09-20 DIAGNOSIS — R091 Pleurisy: Secondary | ICD-10-CM | POA: Insufficient documentation

## 2021-09-20 MED ORDER — OMEPRAZOLE 20 MG PO CPDR: 20.0000 mg | DELAYED_RELEASE_CAPSULE | Freq: Every day | ORAL | 3 refills | Status: DC

## 2021-09-20 NOTE — Telephone Encounter (Signed)
    Chief Complaint: Left side rib pain, in one are. Hurts with a deep breath Symptoms: Pain Frequency: Started after surgery - 08/14/21 Pertinent Negatives: Patient denies SOB, no chest oain Disposition: [] ED /[] Urgent Care (no appt availability in office) / [x] Appointment(In office/virtual)/ []  [place] [facility]/ [] Home Care/ [] Refused Recommended Disposition /[] [place] [facility]/ []  Follow-up with PCP Additional Notes: Go to ED for worsening of symptoms.  Reason for Disposition  [1] Chest pain lasts < 5 minutes AND [2] NO chest pain or cardiac symptoms (e.g., breathing difficulty, sweating) now  (Exception: Chest pains that last only a few seconds.)  Answer Assessment - Initial Assessment Questions 1. LOCATION: "Where does it hurt?"       Left ribs, below breast 2. RADIATION: "Does the pain go anywhere else?" (e.g., into neck, jaw, arms, back)     No 3. ONSET: "When did the chest pain begin?" (Minutes, hours or days)      Started after surgery 4. PATTERN: "Does the pain come and go, or has it been constant since it started?"  "Does it get worse with exertion?"      Comes and goes 5. DURATION: "How long does it last" (e.g., seconds, minutes, hours)     With a deep breath 6. SEVERITY: "How bad is the pain?"  (e.g., Scale 1-10; mild, moderate, or severe)    - MILD (1-3): doesn't interfere with normal activities     - MODERATE (4-7): interferes with normal activities or awakens from sleep    - SEVERE (8-10): excruciating pain, unable to do any normal activities       8 7. CARDIAC RISK FACTORS: "Do you have any history of heart problems or risk factors for heart disease?" (e.g., angina, prior heart attack; diabetes, high blood pressure, high cholesterol, smoker, or strong family history of heart disease)     No 8. PULMONARY RISK FACTORS: "Do you have any history of lung disease?"  (e.g., blood clots in lung, asthma, emphysema, birth control pills)     No 9. CAUSE: "What do you  think is causing the chest pain?"     Unsure 10. OTHER SYMPTOMS: "Do you have any other symptoms?" (e.g., dizziness, nausea, vomiting, sweating, fever, difficulty breathing, cough)       With a deep breath 11. PREGNANCY: "Is there any chance you are pregnant?" "When was your last menstrual period?"       No  Protocols used: Chest Pain-A-AH

## 2021-09-20 NOTE — Progress Notes (Signed)
I,Fareeda Downard Robinson,acting as a Neurosurgeon for [employer], PA-C.,have documented all relevant documentation on the behalf of Debera Lat, PA-C,as directed by  [employer], PA-C while in the presence of [employer], PA-C.    Established patient visit   [name]   DOB: Jul 23, 1970   51 y.o. Female  MRN: [id number] Visit Date: 09/20/2021  Today's healthcare provider: Debera Lat, PA-C   No chief complaint on file.  Subjective    Patient presents for pain just below left rib cage that's almost constant. Pain was coming and going and has become more frequent. Hurts to take deep breath.  Onset was about 2 weeks after surgery on 08-14-21 / breast reduction.  Rates pain at 8 with deep breath. Taking nothing for pain other than Ibuprofen post surgical.      Medications: Outpatient Medications Prior to Visit  Medication Sig   ALPRAZolam (XANAX) 0.5 MG tablet Take 1 tablet (0.5 mg total) by mouth 2 (two) times daily as needed. for anxiety   hydroxychloroquine (PLAQUENIL) 200 MG tablet TAKE 1 AND 1/2 TABLETS(300 MG) BY MOUTH DAILY   ibuprofen (ADVIL,MOTRIN) 200 MG tablet Take 600 mg by mouth every 6 (six) hours as needed for moderate pain.   levocetirizine (XYZAL) 5 MG tablet Take 1 tablet (5 mg total) by mouth daily as needed.   levothyroxine (SYNTHROID) 125 MCG tablet Take 1 tablet (125 mcg total) by mouth daily before breakfast. (Patient taking differently: Take 100 mcg by mouth daily before breakfast.)   Multiple Vitamin (MULTIVITAMIN) capsule Take by mouth.   oxyCODONE (ROXICODONE) 5 MG immediate release tablet Take 1 tablet (5 mg total) by mouth every 8 (eight) hours as needed for up to 20 doses for severe pain.   rizatriptan (MAXALT-MLT) 10 MG disintegrating tablet Take 1 tablet (10 mg total) by mouth as needed for migraine. May repeat in 2 hours if needed   SUMAtriptan (IMITREX) 25 MG tablet Take 1 tablet (25 mg total) by mouth every 2 (two) hours as needed for  migraine.   traZODone (DESYREL) 50 MG tablet TAKE 1/2 TO 1 TABLET(25 TO 50 MG) BY MOUTH AT BEDTIME AS NEEDED FOR SLEEP   leflunomide (ARAVA) 10 MG tablet TAKE 1 TABLET(10 MG) BY MOUTH DAILY   Omega-3 Fatty Acids (FISH OIL) 1000 MG CAPS Take by mouth. (Patient not taking: Reported on 09/20/2021)   ondansetron (ZOFRAN) 4 MG tablet Take 1 tablet (4 mg total) by mouth every 8 (eight) hours as needed for up to 20 doses for nausea or vomiting. (Patient not taking: Reported on 09/20/2021)   No facility-administered medications prior to visit.    Review of Systems  All other systems reviewed and are negative.  Except See HPI    Objective    BP 127/71 (BP Location: Left Arm, Patient Position: Sitting, Cuff Size: Normal)   Pulse 76   Temp 98.2 F (36.8 C) (Oral)   Resp 16   Wt 154 lb (69.9 kg)   SpO2 98%   BMI 24.12 kg/m    Physical Exam Vitals reviewed.  Constitutional:      General: She is not in acute distress.    Appearance: Normal appearance. She is well-developed. She is not diaphoretic.  HENT:     Head: Normocephalic and atraumatic.     Nose: Nose normal.  Eyes:     General: No scleral icterus.    Conjunctiva/sclera: Conjunctivae normal.  Neck:     Thyroid: No thyromegaly.  Cardiovascular:  Rate and Rhythm: Normal rate and regular rhythm.     Pulses: Normal pulses.     Heart sounds: Normal heart sounds. No murmur heard. Pulmonary:     Effort: Pulmonary effort is normal. No respiratory distress.     Breath sounds: Normal breath sounds. No wheezing, rhonchi or rales.  Abdominal:     General: Bowel sounds are normal. There is distension (mild).     Palpations: There is no mass.     Tenderness: There is abdominal tenderness (LUQ). There is no right CVA tenderness, left CVA tenderness, guarding or rebound.     Hernia: No hernia is present.  Musculoskeletal:        General: Tenderness present. No swelling or deformity. Normal range of motion.     Cervical back: Normal  range of motion and neck supple.     Right lower leg: No edema.     Left lower leg: No edema.  Lymphadenopathy:     Cervical: No cervical adenopathy.  Skin:    General: Skin is warm and dry.     Capillary Refill: Capillary refill takes less than 2 seconds.     Findings: No bruising, erythema, lesion or rash.  Neurological:     General: No focal deficit present.     Mental Status: She is alert and oriented to person, place, and time. Mental status is at baseline.  Psychiatric:        Behavior: Behavior normal.        Thought Content: Thought content normal.        Judgment: Judgment normal.     No results found for any visits on 09/20/21.  Assessment & Plan     1. Left upper quadrant abdominal pain Recent hx of taking daily NSAIDS for a month  - omeprazole (PRILOSEC) 20 MG capsule; Take 1 capsule (20 mg total) by mouth daily.  Dispense: 30 capsule; Refill: 3 - Ambulatory referral to Gastroenterology - CBC w/Diff/Platelet - Comprehensive Metabolic Panel (CMET)  2. Pleurisy Pain with deep breathing - DG Chest 2 View; Future - CBC w/Diff/Platelet - Comprehensive Metabolic Panel (CMET)  FU PRN     The patient was advised to call back or seek an in-person evaluation if the symptoms worsen or if the condition fails to improve as anticipated.  I discussed the assessment and treatment plan with the patient. The patient was provided an opportunity to ask questions and all were answered. The patient agreed with the plan and demonstrated an understanding of the instructions.  The entirety of the information documented in the History of Present Illness, Review of Systems and Physical Exam were personally obtained by me. Portions of this information were initially documented by the CMA and reviewed by me for thoroughness and accuracy.  Portions of this note were created using dictation software and may contain typographical errors.        Total encounter time more than 30 minutes   Greater than 50% was spent in counseling and coordination of care with the patient   Cherlynn Polo  [facility] [phone] (phone) [phone] (fax)  [facility]

## 2021-09-21 ENCOUNTER — Telehealth: Payer: Self-pay

## 2021-09-21 LAB — CBC WITH DIFFERENTIAL/PLATELET
Basophils Absolute: 0 10*3/uL (ref 0.0–0.2)
Basos: 0 %
EOS (ABSOLUTE): 0.1 10*3/uL (ref 0.0–0.4)
Eos: 2 %
Hematocrit: 39.3 % (ref 34.0–46.6)
Hemoglobin: 13.5 g/dL (ref 11.1–15.9)
Immature Grans (Abs): 0 10*3/uL (ref 0.0–0.1)
Immature Granulocytes: 0 %
Lymphocytes Absolute: 2.2 10*3/uL (ref 0.7–3.1)
Lymphs: 28 %
MCH: 31.3 pg (ref 26.6–33.0)
MCHC: 34.4 g/dL (ref 31.5–35.7)
MCV: 91 fL (ref 79–97)
Monocytes Absolute: 0.6 10*3/uL (ref 0.1–0.9)
Monocytes: 7 %
Neutrophils Absolute: 4.9 10*3/uL (ref 1.4–7.0)
Neutrophils: 63 %
Platelets: 276 10*3/uL (ref 150–450)
RBC: 4.32 x10E6/uL (ref 3.77–5.28)
RDW: 12.4 % (ref 11.7–15.4)
WBC: 7.9 10*3/uL (ref 3.4–10.8)

## 2021-09-21 LAB — COMPREHENSIVE METABOLIC PANEL
ALT: 13 IU/L (ref 0–32)
AST: 17 IU/L (ref 0–40)
Albumin/Globulin Ratio: 1.9 (ref 1.2–2.2)
Albumin: 4.4 g/dL (ref 3.8–4.9)
Alkaline Phosphatase: 68 IU/L (ref 44–121)
BUN/Creatinine Ratio: 25 — ABNORMAL HIGH (ref 9–23)
BUN: 19 mg/dL (ref 6–24)
Bilirubin Total: 0.3 mg/dL (ref 0.0–1.2)
CO2: 23 mmol/L (ref 20–29)
Calcium: 9.5 mg/dL (ref 8.7–10.2)
Chloride: 100 mmol/L (ref 96–106)
Creatinine, Ser: 0.76 mg/dL (ref 0.57–1.00)
Globulin, Total: 2.3 g/dL (ref 1.5–4.5)
Glucose: 89 mg/dL (ref 70–99)
Potassium: 3.9 mmol/L (ref 3.5–5.2)
Sodium: 142 mmol/L (ref 134–144)
Total Protein: 6.7 g/dL (ref 6.0–8.5)
eGFR: 94 mL/min/{1.73_m2} (ref >59)

## 2021-09-21 NOTE — Progress Notes (Signed)
Hello Caroline Mullen ,   Your labwork results all are within normal limits.   Any questions please reach out to the office or message me on MyChart!  Best, Debera Lat, PA-C

## 2021-09-21 NOTE — Telephone Encounter (Signed)
Copied from CRM [id number]. Topic: General - Other >> Sep 21, 2021  9:10 AM Eleonore Chiquito wrote: Reason for CRM: Pt is requesting a nurse call her to discuss lab results. She states some of her levels are higher than normal

## 2021-09-21 NOTE — Telephone Encounter (Signed)
Pt informed and agreeable to more liquid intake.  She is asking about her chest x-rays.  Please review and advise.

## 2021-09-21 NOTE — Telephone Encounter (Signed)
Pt has questions about labs with elevated values. Please advise.

## 2021-09-22 NOTE — Progress Notes (Signed)
Good news, Caroline Mullen,  You CXR results are back and showed no active cardiopulmonary disease This is reassuring. If you have any questions, please contact me via mychart  Best, Debera Lat

## 2021-10-05 ENCOUNTER — Ambulatory Visit (INDEPENDENT_AMBULATORY_CARE_PROVIDER_SITE_OTHER): Payer: Self-pay | Admitting: Surgical

## 2021-10-05 DIAGNOSIS — G8929 Other chronic pain: Secondary | ICD-10-CM

## 2021-10-05 DIAGNOSIS — N62 Hypertrophy of breast: Secondary | ICD-10-CM

## 2021-10-05 NOTE — Progress Notes (Signed)
Subjective:     Patient ID: Caroline Mullen, female    DOB: 1970-08-14, 51 y.o.   MRN: [id number]  Chief Complaint  Patient presents with   Post-op Follow-up    HPI: Patient is a very pleasant 51 year old female here for follow-up after bilateral breast reduction on 08/14/2021 with Dr. Ulice Bold.  She is approximately 7 weeks postop.  She reports that overall she is healing well in regards to surgery.    She does have significant concerns about the size of her breasts.  She reports that she wanted to be quite small after surgery (approximately a B cup, or C cup if necessary).  She reports that she has tried on multiple bras and the only size that fits her are D cups.  She is tearful when talking about this.  She reports she has been wondering if something went wrong in surgery that caused less tissue to be taken than discussed.  She asks if I can review the operative note and provide her with any information about any issues that may have come up in surgery.  She reports that she has reviewed her chart and noticed that significantly less tissue was removed than initially discussed.  She reports that she understands that liposuction is a part of this volume, but has some questions about this.  She reports that she is still having back pain, was hopeful to be able to wear normal bras after surgery without issues but reports that she only feels comfortable wearing sports bras due to the size of her breasts.  If she does not wear a sports bra she reports back pain.  She reports that she has also noticed some pulling of the left breast towards her armpit which is causing some shape changes.  She reports she is not bothered by this as much as she is by the size.  She was hopeful that the size will continue to improve as swelling went down, but reports she has not noticed much changes, if any over the past 3 weeks.  Review of Systems  Constitutional: Negative.   Skin:  Negative for wound.      Objective:   Vital Signs There were no vitals taken for this visit. Vital Signs and Nursing Note Reviewed Chaperone present Physical Exam Constitutional:      General: She is not in acute distress.    Appearance: Normal appearance. She is normal weight.  HENT:     Head: Normocephalic and atraumatic.  Chest:       Comments: Bilateral breast incisions are intact, CDI, no erythema or cellulitic changes noted.  No subcutaneous fluid collections noted with palpation.  She does have a small suture that you can feel beneath the skin along the right vertical limb. Neurological:     Mental Status: She is alert.  Psychiatric:     Comments: Tearful     Assessment/Plan:     ICD-10-CM   1. Macromastia  N62     2. Chronic bilateral thoracic back pain  M54.6    G89.29       Patient is a 51 year old female status post bilateral breast reduction on 08/14/2021 with Dr. Ulice Bold.  She is upset about her breast size, she reports that she wanted to be preferably a B cup postoperatively, even a C cup if necessary, but reports she is consistently fitting into D cup bras.  She is bothered by this because she wanted to be much smaller so she did not have to  wear sports bras.  She reports ongoing back pain.  I did review her operative report and discussed with her that I did not note any significant abnormalities notated.  We did discuss that I recommend evaluation and a discussion with Dr. Ulice Bold in regards to her surgery.  She would like to set up an appointment with Dr. Ulice Bold to further discuss.  Recommend calling with questions or concerns. Follow up scheduled for 10/10/2021 with Dr. Ulice Bold.  We discussed taking pictures today, but she preferred not to take pictures.   Kermit Balo Kyliyah Stirn, PA-C 10/05/2021, 4:14 PM

## 2021-10-10 ENCOUNTER — Ambulatory Visit: Payer: Managed Care, Other (non HMO) | Admitting: Plastic Surgery

## 2021-10-13 ENCOUNTER — Ambulatory Visit (INDEPENDENT_AMBULATORY_CARE_PROVIDER_SITE_OTHER): Payer: Self-pay | Admitting: Plastic Surgery

## 2021-10-13 DIAGNOSIS — N62 Hypertrophy of breast: Secondary | ICD-10-CM

## 2021-11-16 NOTE — Progress Notes (Signed)
   Subjective:    Patient ID: Caroline Mullen, female    DOB: March 29, 1970, 51 y.o.   MRN: [id number]  HPI The patient is a 51 yrs old female here for follow up after a breast reduction in July.  She did well with the surgery and had mild skin break down that has improved.  She was hoping to be smaller.  We talked about the possibility of the breasts getting slightly smaller or surgical intervention.  She has good symmetry and size for her build  Review of Systems  Constitutional: Negative.   Eyes: Negative.   Respiratory: Negative.    Cardiovascular: Negative.   Gastrointestinal: Negative.   Endocrine: Negative.   Genitourinary: Negative.   Musculoskeletal: Negative.        Objective:   Physical Exam Constitutional:      Appearance: Normal appearance.  Cardiovascular:     Rate and Rhythm: Normal rate.     Pulses: Normal pulses.  Pulmonary:     Effort: Pulmonary effort is normal.  Skin:    General: Skin is warm.     Capillary Refill: Capillary refill takes less than 2 seconds.     Coloration: Skin is not jaundiced.     Findings: No bruising.  Neurological:     Mental Status: She is alert and oriented to person, place, and time.  Psychiatric:        Mood and Affect: Mood normal.        Behavior: Behavior normal.        Thought Content: Thought content normal.        Judgment: Judgment normal.      Assessment & Plan:     ICD-10-CM   1. Macromastia  N62       Patient want more time to think about things.  Pictures were obtained of the patient and placed in the chart with the patient's or guardian's permission.

## 2022-01-14 ENCOUNTER — Other Ambulatory Visit: Payer: Self-pay | Admitting: Family Medicine

## 2022-01-16 ENCOUNTER — Encounter: Payer: Self-pay | Admitting: Plastic Surgery

## 2022-01-16 ENCOUNTER — Ambulatory Visit (INDEPENDENT_AMBULATORY_CARE_PROVIDER_SITE_OTHER): Payer: Self-pay | Admitting: Plastic Surgery

## 2022-01-16 VITALS — BP 148/84

## 2022-01-16 DIAGNOSIS — N62 Hypertrophy of breast: Secondary | ICD-10-CM

## 2022-01-16 DIAGNOSIS — R7303 Prediabetes: Secondary | ICD-10-CM

## 2022-01-16 DIAGNOSIS — M329 Systemic lupus erythematosus, unspecified: Secondary | ICD-10-CM

## 2022-01-16 NOTE — Progress Notes (Signed)
   Subjective:    Patient ID: Caroline Mullen, female    DOB: 1970-08-22, 51 y.o.   MRN: [id number]  The patient is a 51 year old female here with her husband for evaluation of her breasts.  Approximately 5 months ago she underwent bilateral breast reduction.  She is unhappy because she wanted to be much smaller.  We have talked about options.  We could do liposuction so she would have to be off of her lupus medications for as long.  She does not want to be put to sleep or go back to the operating room.  The last visit she had some dimpling of the left breast and I thought I might be able to make that better with the procedure here in the office.  Today that is completely resolved.  She has good symmetry and good contour the main issue is that she is not as small as she wanted to be.      Review of Systems  Constitutional: Negative.   Eyes: Negative.   Respiratory: Negative.    Cardiovascular: Negative.   Endocrine: Negative.   Genitourinary: Negative.        Objective:   Physical Exam Constitutional:      Appearance: Normal appearance.  Cardiovascular:     Rate and Rhythm: Normal rate.     Pulses: Normal pulses.  Pulmonary:     Effort: Pulmonary effort is normal.  Skin:    General: Skin is warm.     Capillary Refill: Capillary refill takes less than 2 seconds.  Neurological:     Mental Status: She is alert and oriented to person, place, and time.  Psychiatric:        Mood and Affect: Mood normal.        Behavior: Behavior normal.        Thought Content: Thought content normal.        Judgment: Judgment normal.        Assessment & Plan:     ICD-10-CM   1. Systemic lupus erythematosus, unspecified SLE type, unspecified organ involvement status (HCC)  M32.9     2. Prediabetes  R73.03     3. Symptomatic mammary hypertrophy  N62       I would like very much to do what ever I can to make this patient happy.  I am not able to do a revision in the office.  I can  certainly be willing to take her to the operating room if she decides she wants to do that.  Follow-up as needed.

## 2022-02-06 ENCOUNTER — Telehealth: Payer: Self-pay | Admitting: Plastic Surgery

## 2022-02-06 NOTE — Telephone Encounter (Signed)
Ptn is upset with her size after surgery.  Explained the reason often size cannot be the level patient wants due to loss of capillary/tissue/blood causing nipple loss. Patient does not want surgeon to revise. Wants 1/2 of $ refund. Reaching out to [employer] experience to determine next steps.

## 2022-02-06 NOTE — Telephone Encounter (Signed)
Reported to [employer]

## 2022-02-06 NOTE — Telephone Encounter (Signed)
TRLVM - HAVE a message on my desk to return call

## 2022-02-12 ENCOUNTER — Ambulatory Visit (INDEPENDENT_AMBULATORY_CARE_PROVIDER_SITE_OTHER): Payer: Self-pay | Admitting: Student

## 2022-02-12 ENCOUNTER — Encounter: Payer: Self-pay | Admitting: Student

## 2022-02-12 VITALS — BP 154/82 | HR 82

## 2022-02-12 DIAGNOSIS — N62 Hypertrophy of breast: Secondary | ICD-10-CM

## 2022-02-12 NOTE — Progress Notes (Signed)
Patient presents to the clinic today for updated photos for her chart.  Chaperone present in the room. Pictures were obtained of the patient and placed in the chart with the patient's or guardian's permission.

## 2022-02-27 LAB — HEPATIC FUNCTION PANEL
ALT: 11 U/L (ref 7–35)
AST: 13 (ref 13–35)
Alkaline Phosphatase: 60 (ref 25–125)

## 2022-02-27 LAB — COMPREHENSIVE METABOLIC PANEL
Calcium: 9.3 (ref 8.7–10.7)
eGFR: 107

## 2022-02-27 LAB — BASIC METABOLIC PANEL
BUN: 17 (ref 4–21)
CO2: 30 — AB (ref 13–22)
Chloride: 107 (ref 99–108)
Creatinine: 0.6 (ref 0.5–1.1)
Glucose: 94
Potassium: 4.1 mEq/L (ref 3.5–5.1)
Sodium: 140 (ref 137–147)

## 2022-03-02 NOTE — Progress Notes (Unsigned)
I,Zamiah Tollett S Aryam Zhan,acting as a Neurosurgeon for Shirlee Latch, MD.,have documented all relevant documentation on the behalf of Shirlee Latch, MD,as directed by  Shirlee Latch, MD while in the presence of Shirlee Latch, MD.    Complete physical exam   [name]   DOB: [birth year]   52 y.o. Female  MRN: [id number] Visit Date: 03/05/2022  Today's healthcare provider: Shirlee Latch, MD   Chief Complaint  Patient presents with   Annual Exam   URI   Subjective    Caroline Mullen is a 52 y.o. female who presents today for a complete physical exam.   She reports consuming a general diet. Gym/ health club routine includes cardio, mod to heavy weightlifting, and treadmill. She generally feels well. She reports sleeping well. She does have additional problems to discuss today.   HPI   Tdap-[facility] 05-22-14? Shingles vaccine-refused today.  Upper respiratory symptoms She complains of bilateral ear pressure/pain, congestion, nasal congestion, post nasal drip, purulent nasal discharge, sinus pressure, and sore throat.with no fever, chills, night sweats or weight loss. Onset of symptoms was  4 days ago and gradually worsening.She is drinking plenty of fluids.  Past history is significant for no history of pneumonia or bronchitis. Patient is non-smoker. Declined to be tested for COVID.   ---------------------------------------------------------------------------------------------------  Past Medical History:  Diagnosis Date   Anxiety    Connective tissue disease (HCC)    on Plaquenil    NO LONGER ON MEDS NO PROBLEMS X 6 YEARS   Graves disease    IRRITABLE BOWEL SYNDROME, HX OF 04/22/2006   Qualifier: History of  By: Tower MD, Colon Flattery    Lupus [facility])    Migraine    Thyroid nodule 02/19/2011   Past Surgical History:  Procedure Laterality Date   ABDOMINAL HYSTERECTOMY     fibroid uterine tumors supracervical   CESAREAN SECTION     HEMORRHOID BANDING      KNEE ARTHROSCOPY     SHOULDER SURGERY  1998   THYROIDECTOMY N/A 08/08/2015   Procedure: THYROIDECTOMY;  Surgeon: Vernie Murders, MD;  Location: [facility] ORS;  Service: ENT;  Laterality: N/A;   TUBAL LIGATION     Social History   Socioeconomic History   Marital status: Married    Spouse name: Not on file   Number of children: 2   Years of education: Not on file   Highest education level: Not on file  Occupational History   Not on file  Tobacco Use   Smoking status: Former    Packs/day: 0.25    Years: 15.00    Total pack years: 3.75    Types: Cigarettes    Quit date: 12/28/2007    Years since quitting: 14.1   Smokeless tobacco: Never  Vaping Use   Vaping Use: Never used  Substance and Sexual Activity   Alcohol use: No   Drug use: No   Sexual activity: Yes    Partners: Male    Birth control/protection: Surgical  Other Topics Concern   Not on file  Social History Narrative   Not on file   Social Determinants of Health   Financial Resource Strain: Not on file  Food Insecurity: Not on file  Transportation Needs: Not on file  Physical Activity: Not on file  Stress: Not on file  Social Connections: Not on file  Intimate Partner Violence: Not on file   Family Status  Relation Name Status   Father  Deceased at age 22-May-2002  skin CA Basal cell   Mother  (Not Specified)   MGM  Deceased   MGF  Deceased   Neg Hx  (Not Specified)   Family History  Problem Relation Age of Onset   Cancer Father        skin CA Basal cell died 27-May-2002   Diabetes Mother    Hypertension Mother    Hyperlipidemia Mother    Hypertension Maternal Grandmother    Hyperlipidemia Maternal Grandmother    Lung cancer Maternal Grandfather    Breast cancer Neg Hx    Colon cancer Neg Hx    Allergies  Allergen Reactions   Morphine Hives    REACTION: rash    Patient Care Team: Virginia Crews, MD as PCP - General (Family Medicine)   Medications: Outpatient Medications Prior to Visit   Medication Sig   ALPRAZolam (XANAX) 0.5 MG tablet TAKE 1 TABLET(0.5 MG) BY MOUTH TWICE DAILY AS NEEDED FOR ANXIETY   hydroxychloroquine (PLAQUENIL) 200 MG tablet TAKE 1 AND 1/2 TABLETS(300 MG) BY MOUTH DAILY   ibuprofen (ADVIL,MOTRIN) 200 MG tablet Take 600 mg by mouth every 6 (six) hours as needed for moderate pain.   leflunomide (ARAVA) 20 MG tablet Take 20 mg by mouth daily.   levocetirizine (XYZAL) 5 MG tablet Take 1 tablet (5 mg total) by mouth daily as needed.   levothyroxine (SYNTHROID) 100 MCG tablet Take by mouth.   Multiple Vitamin (MULTIVITAMIN) capsule Take by mouth.   rizatriptan (MAXALT-MLT) 10 MG disintegrating tablet Take 1 tablet (10 mg total) by mouth as needed for migraine. May repeat in 2 hours if needed   SUMAtriptan (IMITREX) 25 MG tablet Take 1 tablet (25 mg total) by mouth every 2 (two) hours as needed for migraine.   traZODone (DESYREL) 50 MG tablet TAKE 1/2 TO 1 TABLET(25 TO 50 MG) BY MOUTH AT BEDTIME AS NEEDED FOR SLEEP   No facility-administered medications prior to visit.    Review of Systems  HENT:  Positive for congestion, ear pain, postnasal drip, rhinorrhea, sinus pain and sore throat.   Allergic/Immunologic: Positive for immunocompromised state.  All other systems reviewed and are negative.     Objective    BP 130/70 (BP Location: Right Arm, Patient Position: Sitting, Cuff Size: Large)   Pulse 75   Temp 98 F (36.7 C) (Temporal)   Resp 16   Wt 160 lb (72.6 kg)   SpO2 97%   BMI 25.06 kg/m  BP Readings from Last 3 Encounters:  03/05/22 130/70  02/12/22 (!) 154/82  01/16/22 (!) 148/84   Wt Readings from Last 3 Encounters:  03/05/22 160 lb (72.6 kg)  09/20/21 154 lb (69.9 kg)  08/08/21 161 lb (73 kg)       Physical Exam Vitals reviewed.  Constitutional:      General: She is not in acute distress.    Appearance: Normal appearance. She is well-developed. She is not diaphoretic.  HENT:     Head: Normocephalic and atraumatic.     Right  Ear: Tympanic membrane, ear canal and external ear normal.     Left Ear: Tympanic membrane, ear canal and external ear normal.     Nose: Nose normal.     Mouth/Throat:     Mouth: Mucous membranes are moist.     Pharynx: Oropharynx is clear. No oropharyngeal exudate.  Eyes:     General: No scleral icterus.    Conjunctiva/sclera: Conjunctivae normal.     Pupils: Pupils are equal, round, and reactive  to light.  Neck:     Thyroid: No thyromegaly.  Cardiovascular:     Rate and Rhythm: Normal rate and regular rhythm.     Pulses: Normal pulses.     Heart sounds: Normal heart sounds. No murmur heard. Pulmonary:     Effort: Pulmonary effort is normal. No respiratory distress.     Breath sounds: Normal breath sounds. No wheezing or rales.  Abdominal:     General: There is no distension.     Palpations: Abdomen is soft.     Tenderness: There is no abdominal tenderness.  Musculoskeletal:        General: No deformity.     Cervical back: Neck supple.     Right lower leg: No edema.     Left lower leg: No edema.  Lymphadenopathy:     Cervical: No cervical adenopathy.  Skin:    General: Skin is warm and dry.     Findings: No rash.  Neurological:     Mental Status: She is alert and oriented to person, place, and time. Mental status is at baseline.     Gait: Gait normal.  Psychiatric:        Mood and Affect: Mood normal.        Behavior: Behavior normal.        Thought Content: Thought content normal.       Last depression screening scores    03/05/2022    3:18 PM 02/09/2021    3:22 PM 02/08/2020    3:51 PM  PHQ 2/9 Scores  PHQ - 2 Score 0 0 0  PHQ- 9 Score 0 2 4   Last fall risk screening    03/05/2022    3:18 PM  [place] in the past year? 0  Number falls in past yr: 0  Injury with Fall? 0  Risk for fall due to : No Fall Risks  Follow up Falls evaluation completed   Last Audit-C alcohol use screening    03/05/2022    3:19 PM  Alcohol Use Disorder Test (AUDIT)  1.  How often do you have a drink containing alcohol? 0  2. How many drinks containing alcohol do you have on a typical day when you are drinking? 0  3. How often do you have six or more drinks on one occasion? 0  AUDIT-C Score 0   A score of 3 or more in women, and 4 or more in men indicates increased risk for alcohol abuse, EXCEPT if all of the points are from question 1   Results for orders placed or performed in visit on 03/05/22  HM HEPATITIS C SCREENING LAB  Result Value Ref Range   HM Hepatitis Screen Negative-Validated   Basic metabolic panel  Result Value Ref Range   Glucose 94    BUN 17 4 - 21   CO2 30 (A) 13 - 22   Creatinine 0.6 0.5 - 1.1   Potassium 4.1 3.5 - 5.1 mEq/L   Sodium 140 137 - 147   Chloride 107 99 - 108  Comprehensive metabolic panel  Result Value Ref Range   eGFR 107    Calcium 9.3 8.7 - 10.7  Hepatic function panel  Result Value Ref Range   Alkaline Phosphatase 60 25 - 125   ALT 11 7 - 35 U/L   AST 13 13 - 35    Assessment & Plan    Routine Health Maintenance and Physical Exam  Exercise Activities and  Dietary recommendations  Goals   None     Immunization History  Administered Date(s) Administered   Influenza Whole 10/30/2007, 03/01/2009   Td 09/29/2004    Health Maintenance  Topic Date Due   COVID-19 Vaccine (1) Never done   HIV Screening  Never done   Zoster Vaccines- Shingrix (1 of 2) Never done   DTaP/Tdap/Td (2 - Tdap) 09/30/2014   Fecal DNA (Cologuard)  04/07/2022   MAMMOGRAM  03/21/2023   PAP SMEAR-Modifier  02/07/2025   Hepatitis C Screening  Completed   HPV VACCINES  Aged Out   INFLUENZA VACCINE  Discontinued   COLONOSCOPY (Pts 45-45yrs Insurance coverage will need to be confirmed)  Discontinued    Discussed health benefits of physical activity, and encouraged her to engage in regular exercise appropriate for her age and condition.  Problem List Items Addressed This Visit       Endocrine   Postoperative hypothyroidism     Previously well controlled Continue Synthroid at current dose  Recheck TSH and adjust Synthroid as indicated        Relevant Orders   TSH     Other   Avitaminosis D    Continue supplement Recheck level       Relevant Orders   VITAMIN D 25 Hydroxy (Vit-D Deficiency, Fractures)   HYPERLIPIDEMIA    Reviewed last lipid panel Not currently on a statin Recheck FLP and CMP Discussed diet and exercise       Relevant Orders   Lipid panel   GAD (generalized anxiety disorder)    Chronic and fairly well controlled Patient has been hesitant to try SSRI/SNRI Only using low-dose Xanax very sparingly, so we will continue      Prediabetes    Recommend low carb diet Recheck A1c       Relevant Orders   Hemoglobin A1c   Other Visit Diagnoses     Encounter for annual physical exam    -  Primary   Relevant Orders   TSH   Hemoglobin A1c   Lipid panel   VITAMIN D 25 Hydroxy (Vit-D Deficiency, Fractures)   Colon cancer screening       Relevant Orders   Cologuard   Breast cancer screening by mammogram       Relevant Orders   MM 3D SCREEN BREAST BILATERAL        Return in about 1 year (around 03/06/2023) for CPE.     I, Lavon Paganini, MD, have reviewed all documentation for this visit. The documentation on 03/05/22 for the exam, diagnosis, procedures, and orders are all accurate and complete.   Bacigalupo, Dionne Bucy, MD, MPH [facility]

## 2022-03-05 ENCOUNTER — Ambulatory Visit (INDEPENDENT_AMBULATORY_CARE_PROVIDER_SITE_OTHER): Payer: Managed Care, Other (non HMO) | Admitting: Family Medicine

## 2022-03-05 ENCOUNTER — Encounter: Payer: Self-pay | Admitting: Family Medicine

## 2022-03-05 VITALS — BP 130/70 | HR 75 | Temp 98.0°F | Resp 16 | Wt 160.0 lb

## 2022-03-05 DIAGNOSIS — E559 Vitamin D deficiency, unspecified: Secondary | ICD-10-CM

## 2022-03-05 DIAGNOSIS — E782 Mixed hyperlipidemia: Secondary | ICD-10-CM | POA: Diagnosis not present

## 2022-03-05 DIAGNOSIS — F411 Generalized anxiety disorder: Secondary | ICD-10-CM

## 2022-03-05 DIAGNOSIS — Z0000 Encounter for general adult medical examination without abnormal findings: Secondary | ICD-10-CM | POA: Diagnosis not present

## 2022-03-05 DIAGNOSIS — R7303 Prediabetes: Secondary | ICD-10-CM

## 2022-03-05 DIAGNOSIS — E890 Postprocedural hypothyroidism: Secondary | ICD-10-CM

## 2022-03-05 DIAGNOSIS — Z1211 Encounter for screening for malignant neoplasm of colon: Secondary | ICD-10-CM

## 2022-03-05 DIAGNOSIS — Z1231 Encounter for screening mammogram for malignant neoplasm of breast: Secondary | ICD-10-CM

## 2022-03-05 NOTE — Assessment & Plan Note (Signed)
Previously well controlled Continue Synthroid at current dose  Recheck TSH and adjust Synthroid as indicated   

## 2022-03-05 NOTE — Assessment & Plan Note (Signed)
Reviewed last lipid panel Not currently on a statin Recheck FLP and CMP Discussed diet and exercise  

## 2022-03-05 NOTE — Assessment & Plan Note (Signed)
Recommend low carb diet °Recheck A1c  °

## 2022-03-05 NOTE — Assessment & Plan Note (Signed)
Chronic and fairly well controlled Patient has been hesitant to try SSRI/SNRI Only using low-dose Xanax very sparingly, so we will continue

## 2022-03-05 NOTE — Assessment & Plan Note (Signed)
Continue supplement Recheck level 

## 2022-03-17 LAB — LIPID PANEL
Chol/HDL Ratio: 5.5 ratio — ABNORMAL HIGH (ref 0.0–4.4)
Cholesterol, Total: 176 mg/dL (ref 100–199)
HDL: 32 mg/dL — ABNORMAL LOW (ref >39)
LDL Chol Calc (NIH): 113 mg/dL — ABNORMAL HIGH (ref 0–99)
Triglycerides: 172 mg/dL — ABNORMAL HIGH (ref 0–149)
VLDL Cholesterol Cal: 31 mg/dL (ref 5–40)

## 2022-03-17 LAB — TSH: TSH: 2.4 u[IU]/mL (ref 0.450–4.500)

## 2022-03-17 LAB — HEMOGLOBIN A1C
Est. average glucose Bld gHb Est-mCnc: 128 mg/dL
Hgb A1c MFr Bld: 6.1 % — ABNORMAL HIGH (ref 4.8–5.6)

## 2022-03-17 LAB — VITAMIN D 25 HYDROXY (VIT D DEFICIENCY, FRACTURES): Vit D, 25-Hydroxy: 30.4 ng/mL (ref 30.0–100.0)

## 2022-03-18 ENCOUNTER — Other Ambulatory Visit: Payer: Self-pay | Admitting: Family Medicine

## 2022-03-19 ENCOUNTER — Telehealth: Payer: Self-pay | Admitting: Family Medicine

## 2022-03-19 MED ORDER — TRAZODONE HCL 50 MG PO TABS: ORAL_TABLET | ORAL | 3 refills | Status: DC

## 2022-03-19 NOTE — Telephone Encounter (Signed)
[facility] faxed refill request for the following medications:   traZODone (DESYREL) 50 MG tablet    Please advise.

## 2022-03-29 ENCOUNTER — Encounter: Payer: Self-pay | Admitting: Emergency Medicine

## 2022-03-29 ENCOUNTER — Emergency Department: Payer: Managed Care, Other (non HMO)

## 2022-03-29 ENCOUNTER — Other Ambulatory Visit: Payer: Self-pay

## 2022-03-29 DIAGNOSIS — K5900 Constipation, unspecified: Secondary | ICD-10-CM | POA: Diagnosis not present

## 2022-03-29 DIAGNOSIS — R748 Abnormal levels of other serum enzymes: Secondary | ICD-10-CM | POA: Diagnosis not present

## 2022-03-29 DIAGNOSIS — R1011 Right upper quadrant pain: Secondary | ICD-10-CM | POA: Diagnosis present

## 2022-03-29 LAB — COMPREHENSIVE METABOLIC PANEL
ALT: 14 U/L (ref 0–44)
AST: 18 U/L (ref 15–41)
Albumin: 3.8 g/dL (ref 3.5–5.0)
Alkaline Phosphatase: 64 U/L (ref 38–126)
Anion gap: 9 (ref 5–15)
BUN: 22 mg/dL — ABNORMAL HIGH (ref 6–20)
CO2: 26 mmol/L (ref 22–32)
Calcium: 8.7 mg/dL — ABNORMAL LOW (ref 8.9–10.3)
Chloride: 104 mmol/L (ref 98–111)
Creatinine, Ser: 0.78 mg/dL (ref 0.44–1.00)
GFR, Estimated: >60 mL/min (ref >60)
Glucose, Bld: 132 mg/dL — ABNORMAL HIGH (ref 70–99)
Potassium: 3.5 mmol/L (ref 3.5–5.1)
Sodium: 139 mmol/L (ref 135–145)
Total Bilirubin: 0.6 mg/dL (ref 0.3–1.2)
Total Protein: 6.5 g/dL (ref 6.5–8.1)

## 2022-03-29 LAB — CBC WITH DIFFERENTIAL/PLATELET
Abs Immature Granulocytes: 0 10*3/uL (ref 0.00–0.07)
Basophils Absolute: 0 10*3/uL (ref 0.0–0.1)
Basophils Relative: 1 %
Eosinophils Absolute: 0.2 10*3/uL (ref 0.0–0.5)
Eosinophils Relative: 4 %
HCT: 39.3 % (ref 36.0–46.0)
Hemoglobin: 13.1 g/dL (ref 12.0–15.0)
Immature Granulocytes: 0 %
Lymphocytes Relative: 42 %
Lymphs Abs: 2.8 10*3/uL (ref 0.7–4.0)
MCH: 30 pg (ref 26.0–34.0)
MCHC: 33.3 g/dL (ref 30.0–36.0)
MCV: 89.9 fL (ref 80.0–100.0)
Monocytes Absolute: 0.6 10*3/uL (ref 0.1–1.0)
Monocytes Relative: 10 %
Neutro Abs: 2.8 10*3/uL (ref 1.7–7.7)
Neutrophils Relative %: 43 %
Platelets: 234 10*3/uL (ref 150–400)
RBC: 4.37 MIL/uL (ref 3.87–5.11)
RDW: 12.5 % (ref 11.5–15.5)
WBC: 6.4 10*3/uL (ref 4.0–10.5)
nRBC: 0 % (ref 0.0–0.2)

## 2022-03-29 LAB — LIPASE, BLOOD: Lipase: 53 U/L — ABNORMAL HIGH (ref 11–51)

## 2022-03-29 NOTE — ED Triage Notes (Signed)
  Patient comes in with RUQ pain that started earlier this morning.  Patient states when she woke up the area felt sore/tender.  Patient thought it was irritation due to her sports bra but noticed it was worse and the area was puffy when she laid down to go to bed.  No N/V.  Non-radiating pain 5/10, sharp.  Took ibuprofen earlier this morning.

## 2022-03-30 ENCOUNTER — Emergency Department: Payer: Managed Care, Other (non HMO)

## 2022-03-30 ENCOUNTER — Emergency Department
Admission: EM | Admit: 2022-03-30 | Discharge: 2022-03-30 | Disposition: A | Discharge status: Home / Self Care | Payer: Managed Care, Other (non HMO) | Attending: Emergency Medicine | Admitting: Emergency Medicine

## 2022-03-30 DIAGNOSIS — K5900 Constipation, unspecified: Secondary | ICD-10-CM

## 2022-03-30 DIAGNOSIS — R109 Unspecified abdominal pain: Secondary | ICD-10-CM

## 2022-03-30 MED ORDER — DOCUSATE SODIUM 100 MG PO CAPS: 100.0000 mg | ORAL_CAPSULE | Freq: Two times a day (BID) | ORAL | 2 refills | Status: DC

## 2022-03-30 MED ORDER — POLYETHYLENE GLYCOL 3350 17 GM/SCOOP PO POWD: 17.0000 g | Freq: Two times a day (BID) | ORAL | 0 refills | Status: DC | PRN

## 2022-03-30 NOTE — ED Provider Notes (Signed)
[facility] Provider Note    Event Date/Time   First MD Initiated Contact with Patient 03/30/22 0205     (approximate)  History   Chief Complaint: Abdominal Pain  HPI  Caroline Mullen is a 52 y.o. female with a past medical history of anxiety, lupus, presents to the emergency department for right-sided abdominal pain.  According to the patient earlier this morning she woke and with right upper quadrant abdominal pain.  Patient states she was wearing a sports bra overnight and thought it could have just been local irritation but the pain has continued although mild throughout the day.  Patient denies any nausea vomiting diarrhea.  No fever.  Denies alcohol use.  Physical Exam   Triage Vital Signs: ED Triage Vitals  Enc Vitals Group     BP 03/29/22 2302 (!) 161/73     Pulse Rate 03/29/22 2302 66     Resp 03/29/22 2302 18     Temp 03/29/22 2302 98 F (36.7 C)     Temp Source 03/29/22 2302 Oral     SpO2 03/29/22 2302 98 %     Weight 03/29/22 2303 157 lb (71.2 kg)     Height 03/29/22 2303 '5\' 7"'$  (1.702 m)     Head Circumference --      Peak Flow --      Pain Score 03/29/22 2302 5     Pain Loc --      Pain Edu? --      Excl. in [place]? --     Most recent vital signs: Vitals:   03/29/22 2302  BP: (!) 161/73  Pulse: 66  Resp: 18  Temp: 98 F (36.7 C)  SpO2: 98%    General: Awake, no distress.  CV:  Good peripheral perfusion.  Regular rate and rhythm  Resp:  Normal effort.  Equal breath sounds bilaterally.  Abd:  No distention.  Soft, slight right upper and right mid abdominal tenderness.  No rebound or guarding.   ED Results / Procedures / Treatments   EKG  EKG viewed and interpreted by myself shows a normal sinus rhythm at 67 bpm with a narrow QRS, normal axis, normal intervals, no concerning ST changes.  RADIOLOGY  I have reviewed and interpreted the CT images.  Patient appears to have significant constipation with stool seen on the right  colon. Radiology is read the CT scan is negative for significant finding. Chest x-ray is negative.   MEDICATIONS ORDERED IN ED: Medications - No data to display   IMPRESSION / MDM / [place] / ED COURSE  I reviewed the triage vital signs and the nursing notes.  Patient's presentation is most consistent with acute presentation with potential threat to life or bodily function.  Patient presents emergency department for essentially 24 hours of right-sided abdominal pain mostly in the right upper quadrant.  Patient has very minimal tenderness to this area.  States the pain has been somewhat intermittent coming and going.  Patient's workup shows a reassuring CBC with a normal white blood cell count, chemistry is reassuring including normal LFTs.  Lipase is slightly/borderline elevated.  Chest x-ray is clear, EKG reassuring.  Patient CT scan does appear to show constipation with right-sided stool noted.  Given otherwise reassuring workup I highly suspect this to be the cause of the patient's discomfort.  I discussed with the patient use of Colace as well as MiraLAX until she has multiple bowel movements.  Discussed return precautions for  any worsening pain any fever.  Patient agreeable to plan.  FINAL CLINICAL IMPRESSION(S) / ED DIAGNOSES   Right-sided abdominal pain Constipation   Note:  This document was prepared using Dragon voice recognition software and may include unintentional dictation errors.   Harvest Dark, MD 03/30/22 [phone]

## 2022-03-30 NOTE — ED Notes (Signed)
Pt declined DC VS, "I just want to get home."

## 2022-04-02 ENCOUNTER — Telehealth: Payer: Self-pay

## 2022-04-02 NOTE — Transitions of Care (Post Inpatient/ED Visit) (Signed)
   04/02/2022  Name: Caroline Mullen MRN: [id number] DOB: 1971-01-15  Today's TOC FU Call Status: Today's TOC FU Call Status:: Unsuccessul Call (1st Attempt)  Attempted to reach the patient regarding the most recent Inpatient/ED visit.  Follow Up Plan: Additional outreach attempts will be made to reach the patient to complete the Transitions of Care (Post Inpatient/ED visit) call.   Signature Stephan Minister, [place]

## 2022-04-28 LAB — COLOGUARD: COLOGUARD: NEGATIVE

## 2022-05-08 ENCOUNTER — Ambulatory Visit
Admission: RE | Admit: 2022-05-08 | Discharge: 2022-05-08 | Disposition: A | Discharge status: Home / Self Care | Payer: Managed Care, Other (non HMO) | Source: Ambulatory Visit | Attending: Family Medicine | Admitting: Family Medicine

## 2022-05-08 DIAGNOSIS — Z1231 Encounter for screening mammogram for malignant neoplasm of breast: Secondary | ICD-10-CM | POA: Diagnosis present

## 2022-05-21 ENCOUNTER — Telehealth: Payer: Self-pay | Admitting: Plastic Surgery

## 2022-05-21 NOTE — Telephone Encounter (Signed)
Ptn called and asked if her surgery had been reviewed.  I advised Dr. Everlene Farrier is chief of surgery he is the person who reviewed the chart.  She said he told her someone else would review it - I advised he found no issues with standard of care .  I will email Grievance and Dr. Everlene Farrier and advise that patient does not believe this request is closed.

## 2022-06-18 ENCOUNTER — Telehealth: Payer: Self-pay | Admitting: Family Medicine

## 2022-06-18 MED ORDER — LEVOCETIRIZINE DIHYDROCHLORIDE 5 MG PO TABS: 5.0000 mg | ORAL_TABLET | Freq: Every day | ORAL | 3 refills | Status: DC | PRN

## 2022-06-18 NOTE — Telephone Encounter (Signed)
[facility] pharmacy faxed refill request for the following medications:   levocetirizine (XYZAL) 5 MG tablet    Please advise

## 2022-08-05 ENCOUNTER — Other Ambulatory Visit: Payer: Self-pay

## 2022-08-05 ENCOUNTER — Encounter: Payer: Self-pay | Admitting: Emergency Medicine

## 2022-08-05 ENCOUNTER — Emergency Department
Admission: EM | Admit: 2022-08-05 | Discharge: 2022-08-05 | Disposition: A | Discharge status: Home / Self Care | Payer: Managed Care, Other (non HMO) | Attending: Emergency Medicine | Admitting: Emergency Medicine

## 2022-08-05 ENCOUNTER — Emergency Department: Payer: Managed Care, Other (non HMO)

## 2022-08-05 DIAGNOSIS — R110 Nausea: Secondary | ICD-10-CM | POA: Diagnosis not present

## 2022-08-05 DIAGNOSIS — R079 Chest pain, unspecified: Secondary | ICD-10-CM | POA: Insufficient documentation

## 2022-08-05 LAB — COMPREHENSIVE METABOLIC PANEL
ALT: 15 U/L (ref 0–44)
AST: 20 U/L (ref 15–41)
Albumin: 4.3 g/dL (ref 3.5–5.0)
Alkaline Phosphatase: 53 U/L (ref 38–126)
Anion gap: 11 (ref 5–15)
BUN: 7 mg/dL (ref 6–20)
CO2: 26 mmol/L (ref 22–32)
Calcium: 9.1 mg/dL (ref 8.9–10.3)
Chloride: 100 mmol/L (ref 98–111)
Creatinine, Ser: 0.73 mg/dL (ref 0.44–1.00)
GFR, Estimated: >60 mL/min (ref >60)
Glucose, Bld: 152 mg/dL — ABNORMAL HIGH (ref 70–99)
Potassium: 3.7 mmol/L (ref 3.5–5.1)
Sodium: 137 mmol/L (ref 135–145)
Total Bilirubin: 0.7 mg/dL (ref 0.3–1.2)
Total Protein: 7.4 g/dL (ref 6.5–8.1)

## 2022-08-05 LAB — CBC WITH DIFFERENTIAL/PLATELET
Abs Immature Granulocytes: 0.02 10*3/uL (ref 0.00–0.07)
Basophils Absolute: 0 10*3/uL (ref 0.0–0.1)
Basophils Relative: 0 %
Eosinophils Absolute: 0.1 10*3/uL (ref 0.0–0.5)
Eosinophils Relative: 2 %
HCT: 45.1 % (ref 36.0–46.0)
Hemoglobin: 15.2 g/dL — ABNORMAL HIGH (ref 12.0–15.0)
Immature Granulocytes: 0 %
Lymphocytes Relative: 34 %
Lymphs Abs: 2.7 10*3/uL (ref 0.7–4.0)
MCH: 30.2 pg (ref 26.0–34.0)
MCHC: 33.7 g/dL (ref 30.0–36.0)
MCV: 89.5 fL (ref 80.0–100.0)
Monocytes Absolute: 0.6 10*3/uL (ref 0.1–1.0)
Monocytes Relative: 7 %
Neutro Abs: 4.6 10*3/uL (ref 1.7–7.7)
Neutrophils Relative %: 57 %
Platelets: 279 10*3/uL (ref 150–400)
RBC: 5.04 MIL/uL (ref 3.87–5.11)
RDW: 12.2 % (ref 11.5–15.5)
WBC: 8.1 10*3/uL (ref 4.0–10.5)
nRBC: 0 % (ref 0.0–0.2)

## 2022-08-05 LAB — LIPASE, BLOOD: Lipase: 41 U/L (ref 11–51)

## 2022-08-05 LAB — MAGNESIUM: Magnesium: 1.9 mg/dL (ref 1.7–2.4)

## 2022-08-05 LAB — TROPONIN I (HIGH SENSITIVITY)
Troponin I (High Sensitivity): 3 ng/L (ref <18)
Troponin I (High Sensitivity): 2 ng/L (ref <18)

## 2022-08-05 LAB — TSH: TSH: 4.325 u[IU]/mL (ref 0.350–4.500)

## 2022-08-05 MED ORDER — POTASSIUM CHLORIDE CRYS ER 20 MEQ PO TBCR
20.0000 meq | EXTENDED_RELEASE_TABLET | Freq: Once | ORAL | Status: DC
  Filled 2022-08-05: qty 1

## 2022-08-05 MED ORDER — PANTOPRAZOLE SODIUM 40 MG IV SOLR
40.0000 mg | Freq: Once | INTRAVENOUS | Status: AC
  Administered 2022-08-05: 40 mg via INTRAVENOUS
  Filled 2022-08-05: qty 10

## 2022-08-05 MED ORDER — IOHEXOL 350 MG/ML SOLN
75.0000 mL | Freq: Once | INTRAVENOUS | Status: AC | PRN
  Administered 2022-08-05: 75 mL via INTRAVENOUS

## 2022-08-05 MED ORDER — FENTANYL CITRATE PF 50 MCG/ML IJ SOSY
50.0000 ug | PREFILLED_SYRINGE | Freq: Once | INTRAMUSCULAR | Status: DC
  Filled 2022-08-05: qty 1

## 2022-08-05 MED ORDER — KETOROLAC TROMETHAMINE 15 MG/ML IJ SOLN
15.0000 mg | Freq: Once | INTRAMUSCULAR | Status: AC
  Administered 2022-08-05: 15 mg via INTRAVENOUS
  Filled 2022-08-05: qty 1

## 2022-08-05 MED ORDER — OMEPRAZOLE MAGNESIUM 20 MG PO TBEC: 20.0000 mg | DELAYED_RELEASE_TABLET | Freq: Every day | ORAL | 2 refills | Status: DC

## 2022-08-05 MED ORDER — MAGNESIUM SULFATE IN D5W 1-5 GM/100ML-% IV SOLN
1.0000 g | Freq: Once | INTRAVENOUS | Status: DC
  Filled 2022-08-05: qty 100

## 2022-08-05 NOTE — ED Provider Notes (Addendum)
[facility] Provider Note    Event Date/Time   First MD Initiated Contact with Patient 08/05/22 0451     (approximate)   History   Chest Pain   HPI  Caroline Mullen is a 52 y.o. female past medical history significant for lupus, anxiety, presents to the emergency department with chest pain.  States that she has had intermittent episodes of left-sided chest pain for the past couple of weeks but it significantly worsened over the past 3 days.  Associated with nausea but no episodes of vomiting.  Endorses a constant pain.  Believed it was may be acid reflux that she has been taking Tums and over-the-counter acid reducing medications with no significant improvement.  Tonight felt like her left-sided chest pain radiated to her back.  Denies any dysuria, urinary urgency or frequency.  Denies any daily NSAID use or Goody powder use.  No history of peptic ulcer disease.  No blood in her stool or melena.  No falls or trauma.  No history of DVT or PE.  Feels like her chest pain is worse with deep inspiration.  No cough.  Denies tobacco use or alcohol use.  No family history of coronary artery disease at a young age.     Physical Exam   Triage Vital Signs: ED Triage Vitals [08/05/22 0407]  Enc Vitals Group     BP (!) 162/88     Pulse Rate (!) 102     Resp 18     Temp 97.9 F (36.6 C)     Temp Source Oral     SpO2 100 %     Weight 165 lb (74.8 kg)     Height 5\' 7"  (1.702 m)     Head Circumference      Peak Flow      Pain Score 8     Pain Loc      Pain Edu?      Excl. in [place]?     Most recent vital signs: Vitals:   08/05/22 0407  BP: (!) 162/88  Pulse: (!) 102  Resp: 18  Temp: 97.9 F (36.6 C)  SpO2: 100%    Physical Exam Constitutional:      Appearance: She is well-developed.  HENT:     Head: Atraumatic.  Eyes:     Conjunctiva/sclera: Conjunctivae normal.  Cardiovascular:     Rate and Rhythm: Regular rhythm.     Heart sounds: Normal heart  sounds.  Pulmonary:     Effort: Pulmonary effort is normal. No respiratory distress.  Abdominal:     General: There is no distension.     Palpations: Abdomen is soft.     Tenderness: There is no abdominal tenderness.  Musculoskeletal:        General: Normal range of motion.     Cervical back: Normal range of motion.     Right lower leg: No edema.     Left lower leg: No edema.  Skin:    General: Skin is warm.  Neurological:     Mental Status: She is alert. Mental status is at baseline.     IMPRESSION / MDM / ASSESSMENT AND PLAN / ED COURSE  I reviewed the triage vital signs and the nursing notes.  Past medical history significant for lupus, on chart review patient is on immunosuppressive medications and followed by rheumatology.  Differential diagnosis including gastritis/PUD, pulmonary embolism, pneumonia, constipation  EKG  I, Corena Herter, the attending physician, personally viewed and  interpreted this ECG.   Rate: Normal  Rhythm: Normal sinus  Axis: Normal  Intervals: Significantly prolonged QTc in the 600s  ST&T Change: None  No tachycardic or bradycardic dysrhythmias while on cardiac telemetry.  RADIOLOGY I independently reviewed imaging, my interpretation of imaging: Chest x-ray with no signs of pneumonia  LABS (all labs ordered are listed, but only abnormal results are displayed) Labs interpreted as -    Labs Reviewed  COMPREHENSIVE METABOLIC PANEL - Abnormal; Notable for the following components:      Result Value   Glucose, Bld 152 (*)    All other components within normal limits  CBC WITH DIFFERENTIAL/PLATELET - Abnormal; Notable for the following components:   Hemoglobin 15.2 (*)    All other components within normal limits  LIPASE, BLOOD  MAGNESIUM  TSH  TROPONIN I (HIGH SENSITIVITY)  TROPONIN I (HIGH SENSITIVITY)     MDM    EKG with significantly prolonged QTc.  Low risk heart score, serial troponins are negative, low suspicion for ACS.   Creatinine at baseline with no significant electrolyte abnormalities.  Clinical picture is not consistent with a pancreatitis.  Patient with no episodes of palpitations or syncope.  Will give IV magnesium and p.o. potassium.  Currently pain-free.  Will start the patient on a PPI.  Repeating EKG.  If EKG continues to have prolonged QTc will give a referral to cardiology and have discussed with her that she needs to discuss with her rheumatologist about Plaquenil.  No other medications that seem to be prolonging QT.  Discussed which medications to avoid as an outpatient that would be over-the-counter  Repeat EKG with normal QTC.    PROCEDURES:  Critical Care performed: No  Procedures  Patient's presentation is most consistent with acute presentation with potential threat to life or bodily function.   MEDICATIONS ORDERED IN ED: Medications  magnesium sulfate IVPB 1 g 100 mL (has no administration in time range)  potassium chloride SA (KLOR-CON M) CR tablet 20 mEq (has no administration in time range)  pantoprazole (PROTONIX) injection 40 mg (40 mg Intravenous Given 08/05/22 0533)  ketorolac (TORADOL) 15 MG/ML injection 15 mg (15 mg Intravenous Given 08/05/22 0533)  iohexol (OMNIPAQUE) 350 MG/ML injection 75 mL (75 mLs Intravenous Contrast Given 08/05/22 0546)    FINAL CLINICAL IMPRESSION(S) / ED DIAGNOSES   Final diagnoses:  Chest pain, unspecified type     Rx / DC Orders   ED Discharge Orders     None        Note:  This document was prepared using Dragon voice recognition software and may include unintentional dictation errors.   Corena Herter, MD 08/05/22 1478    Corena Herter, MD 08/05/22 [phone]

## 2022-08-05 NOTE — ED Triage Notes (Signed)
Pt presents ambulatory to triage via POV with complaints of L sided CP with associated nausea x 3 days. Pt has tried OTC tums with no improvement in her sx. Describes the pain as a heavy pressure to the L side of her chest with radiation to her back.  A&Ox4 at this time. Denies fevers, cough, chills, or SOB.

## 2022-08-05 NOTE — ED Notes (Signed)
Went over d/c paperwork at this time with patient. Pt had no questions, comments or concerns after review and verbally understood them.  

## 2022-08-07 ENCOUNTER — Encounter: Payer: Self-pay | Admitting: Family Medicine

## 2022-08-07 ENCOUNTER — Ambulatory Visit: Payer: Managed Care, Other (non HMO) | Admitting: Family Medicine

## 2022-08-07 VITALS — BP 114/77 | HR 90 | Temp 97.6°F | Resp 12 | Ht 67.0 in | Wt 161.7 lb

## 2022-08-07 DIAGNOSIS — R9431 Abnormal electrocardiogram [ECG] [EKG]: Secondary | ICD-10-CM | POA: Diagnosis not present

## 2022-08-07 DIAGNOSIS — R002 Palpitations: Secondary | ICD-10-CM

## 2022-08-07 DIAGNOSIS — R0609 Other forms of dyspnea: Secondary | ICD-10-CM

## 2022-08-07 DIAGNOSIS — R079 Chest pain, unspecified: Secondary | ICD-10-CM | POA: Diagnosis not present

## 2022-08-07 DIAGNOSIS — R195 Other fecal abnormalities: Secondary | ICD-10-CM

## 2022-08-07 MED ORDER — KETOROLAC TROMETHAMINE 10 MG PO TABS: 10.0000 mg | ORAL_TABLET | Freq: Four times a day (QID) | ORAL | 0 refills | Status: DC | PRN

## 2022-08-07 NOTE — Addendum Note (Signed)
Addended by: Erasmo Downer on: 08/07/2022 12:14 PM   Modules accepted: Orders

## 2022-08-07 NOTE — Progress Notes (Signed)
I,Caroline Mullen,acting as a Neurosurgeon for Caroline Latch, MD.,have documented all relevant documentation on the behalf of Caroline Latch, MD,as directed by  Caroline Latch, MD while in the presence of Caroline Latch, MD.     Established patient visit   [name]   DOB: 16-Oct-1970   51 y.o. Female  MRN: [id number] Visit Date: 08/07/2022  Today's healthcare provider: Shirlee Latch, MD   Chief Complaint  Patient presents with   Follow-up   Subjective    HPI Follow up ER visit  Patient was seen in ER for chest pain on 08/05/22. She was treated for unspecified chest pain. Treatment for this included Ct scan and Prilosec. She reports excellent compliance with treatment. She reports this condition is  improved. She reports chest pain last night. She reports rapid heart rate while waling up the stairs. Patient also had a black bowel movement this morning. She reports it could be caused by CT scan with contrast or Prilosec .  -----------------------------------------------------------------------------------------  Discussed the use of AI scribe software for clinical note transcription with the patient, who gave verbal consent to proceed.  History of Present Illness   The patient, with a history of chest pain, presented for a follow-up after an ER visit two days ago. The patient reported experiencing chest pain, which prompted the ER visit. An EKG performed in the ER showed a prolonged QT interval, but a repeat EKG showed normalization of the QT interval. The patient has since stopped taking Plaquenil, a medication known to cause QT prolongation.  Since the ER visit, the patient has been experiencing shortness of breath, palpitations, and chest pain that wakes them up at night. The patient reported that these symptoms are new and concerning, especially the shortness of breath with exertion, which was not present a few weeks ago.  In addition to these  symptoms, the patient also reported a recent episode of black stool. The patient has not been eating much since the ER visit due to fear of exacerbating the chest pain. The patient has been consuming Gatorade for electrolyte replenishment.        Medications: Outpatient Medications Prior to Visit  Medication Sig   ALPRAZolam (XANAX) 0.5 MG tablet TAKE 1 TABLET(0.5 MG) BY MOUTH TWICE DAILY AS NEEDED FOR ANXIETY   leflunomide (ARAVA) 20 MG tablet Take 20 mg by mouth daily.   levocetirizine (XYZAL) 5 MG tablet Take 1 tablet (5 mg total) by mouth daily as needed.   levothyroxine (SYNTHROID) 100 MCG tablet Take by mouth.   Multiple Vitamin (MULTIVITAMIN) capsule Take by mouth.   omeprazole (PRILOSEC OTC) 20 MG tablet Take 1 tablet (20 mg total) by mouth daily.   rizatriptan (MAXALT-MLT) 10 MG disintegrating tablet TAKE 1 TABLET BY MOUTH AS NEEDED FOR MIGRAINE, MAY REPEAT IN 2 HOURS IF NEEDED   SUMAtriptan (IMITREX) 25 MG tablet Take 1 tablet (25 mg total) by mouth every 2 (two) hours as needed for migraine.   traZODone (DESYREL) 50 MG tablet TAKE 1/2 TO 1 TABLET(25 TO 50 MG) BY MOUTH AT BEDTIME AS NEEDED FOR SLEEP   [DISCONTINUED] hydroxychloroquine (PLAQUENIL) 200 MG tablet TAKE 1 AND 1/2 TABLETS(300 MG) BY MOUTH DAILY   [DISCONTINUED] ibuprofen (ADVIL,MOTRIN) 200 MG tablet Take 600 mg by mouth every 6 (six) hours as needed for moderate pain.   [DISCONTINUED] docusate sodium (COLACE) 100 MG capsule Take 1 capsule (100 mg total) by mouth 2 (two) times daily.   [DISCONTINUED] polyethylene glycol powder (GLYCOLAX/MIRALAX) 17 GM/SCOOP  powder Take 17 g by mouth 2 (two) times daily as needed for mild constipation.   No facility-administered medications prior to visit.    Review of Systems per HPI     Objective    BP 114/77 (BP Location: Left Arm, Patient Position: Sitting, Cuff Size: Normal)   Pulse 90   Temp 97.6 F (36.4 C) (Temporal)   Resp 12   Ht 5\' 7"  (1.702 m)   Wt 161 lb 11.2 oz  (73.3 kg)   SpO2 98%   BMI 25.33 kg/m    Physical Exam Vitals reviewed.  Constitutional:      General: She is not in acute distress.    Appearance: Normal appearance. She is well-developed. She is not diaphoretic.  HENT:     Head: Normocephalic and atraumatic.  Eyes:     General: No scleral icterus.    Conjunctiva/sclera: Conjunctivae normal.  Neck:     Thyroid: No thyromegaly.  Cardiovascular:     Rate and Rhythm: Normal rate and regular rhythm.     Pulses: Normal pulses.     Heart sounds: Normal heart sounds. No murmur heard.    Comments: Mild TTP over L lower ribs Pulmonary:     Effort: Pulmonary effort is normal. No respiratory distress.     Breath sounds: Normal breath sounds. No wheezing, rhonchi or rales.  Abdominal:     General: There is no distension.     Palpations: Abdomen is soft.     Tenderness: There is no abdominal tenderness.  Musculoskeletal:     Cervical back: Neck supple.     Right lower leg: No edema.     Left lower leg: No edema.  Lymphadenopathy:     Cervical: No cervical adenopathy.  Skin:    General: Skin is warm and dry.     Findings: No rash.  Neurological:     Mental Status: She is alert and oriented to person, place, and time. Mental status is at baseline.  Psychiatric:        Mood and Affect: Mood normal.        Behavior: Behavior normal.     EKG: wnl, normal QT interval  No results found for any visits on 08/07/22.  Assessment & Plan     Problem List Items Addressed This Visit       Other   Palpitations   Relevant Orders   Ambulatory referral to Cardiology   Other Visit Diagnoses     Chest pain, unspecified type    -  Primary   Relevant Orders   Ambulatory referral to Cardiology   Prolonged Q-T interval on ECG       Relevant Orders   Ambulatory referral to Cardiology   Comprehensive metabolic panel   Magnesium   DOE (dyspnea on exertion)       Relevant Orders   Ambulatory referral to Cardiology   Dark stools        Relevant Orders   IFOBT POC (occult bld, rslt in office)          Prolonged QT Interval: Noted during ER visit on 08/05/2022, likely secondary to Plaquenil. Patient has since stopped Plaquenil. Recent EKG shows QT interval has returned to normal. -Discontinue Plaquenil. -Referral to cardiology for further evaluation. -Check electrolytes today.  Chest Pain and Shortness of Breath: New onset, possibly musculoskeletal in nature. Pain responsive to Toradol. -Start short course of oral Toradol. -Continue monitoring symptoms.  Possible Gastrointestinal Bleeding: Patient reports black stool. No current abdominal  pain. -Perform rectal exam and test stool for blood today. - negaitve - continue to monitor - possibly from elimination of contrast  Reflux-like Symptoms: Not responsive to omeprazole. -Discontinue omeprazole. -Consider alternative treatment options if symptoms persist.        Return if symptoms worsen or fail to improve.       Total time spent on today's visit was greater than 40 minutes, including both face-to-face time and nonface-to-face time personally spent on review of chart (labs and imaging), discussing labs and goals, discussing further work-up, treatment options, referrals to specialist, answering patient's questions, and coordinating care.   I, Caroline Latch, MD, have reviewed all documentation for this visit. The documentation on 08/07/22 for the exam, diagnosis, procedures, and orders are all accurate and complete.   Rola Lennon, Marzella Schlein, MD, MPH [facility]

## 2022-08-08 ENCOUNTER — Ambulatory Visit: Payer: Managed Care, Other (non HMO)

## 2022-08-08 ENCOUNTER — Ambulatory Visit: Admission: EM | Admit: 2022-08-08 | Payer: Managed Care, Other (non HMO)

## 2022-08-08 ENCOUNTER — Encounter: Payer: Self-pay | Admitting: Family Medicine

## 2022-08-08 LAB — COMPREHENSIVE METABOLIC PANEL
ALT: 11 IU/L (ref 0–32)
AST: 12 IU/L (ref 0–40)
Albumin: 4.1 g/dL (ref 3.8–4.9)
Alkaline Phosphatase: 55 IU/L (ref 44–121)
BUN/Creatinine Ratio: 32 — ABNORMAL HIGH (ref 9–23)
BUN: 24 mg/dL (ref 6–24)
Bilirubin Total: 0.8 mg/dL (ref 0.0–1.2)
CO2: 23 mmol/L (ref 20–29)
Calcium: 8.9 mg/dL (ref 8.7–10.2)
Chloride: 103 mmol/L (ref 96–106)
Creatinine, Ser: 0.74 mg/dL (ref 0.57–1.00)
Globulin, Total: 2.2 g/dL (ref 1.5–4.5)
Glucose: 109 mg/dL — ABNORMAL HIGH (ref 70–99)
Potassium: 4.4 mmol/L (ref 3.5–5.2)
Sodium: 139 mmol/L (ref 134–144)
Total Protein: 6.3 g/dL (ref 6.0–8.5)
eGFR: 97 mL/min/{1.73_m2} (ref >59)

## 2022-08-08 LAB — MAGNESIUM: Magnesium: 2 mg/dL (ref 1.6–2.3)

## 2022-08-13 ENCOUNTER — Telehealth: Payer: Self-pay

## 2022-08-13 NOTE — Telephone Encounter (Signed)
Pt confirmed that she is coming to appt

## 2022-08-13 NOTE — Telephone Encounter (Signed)
Looks like AM appt just opened for tomorrow.

## 2022-08-13 NOTE — Telephone Encounter (Signed)
Patient is wanting to be seen tomorrow morning with Dr. Leonard Schwartz before leaving town for a Hospital F/U.   Does not want a p.m. appt that was offered.

## 2022-08-13 NOTE — Transitions of Care (Post Inpatient/ED Visit) (Signed)
   08/13/2022  Name: Caroline Mullen MRN: [id number] DOB: [birth year]  Today's TOC FU Call Status: Today's TOC FU Call Status:: Successful TOC FU Call Competed TOC FU Call Complete Date: 08/13/22  Transition Care Management Follow-up Telephone Call Date of Discharge: 08/10/22 Discharge Facility: Other [facility]) Name of Other (Non-[facility]) Discharge Facility: [facility] Type of Discharge: Inpatient Admission Primary Inpatient Discharge Diagnosis:: GI Bleed Any questions or concerns?: No  Items Reviewed: Did you receive and understand the discharge instructions provided?: No Medications obtained,verified, and reconciled?: Yes (Medications Reviewed) Any new allergies since your discharge?: No Dietary orders reviewed?: NA Do you have support at home?: Yes People in Home: spouse  Medications Reviewed Today: Medications Reviewed Today     Reviewed by Karena Addison, LPN (Licensed Practical Nurse) on 08/13/22 at [phone]  Med List Status: <None>   Medication Order Taking? Sig Documenting Provider Last Dose Status Informant  ALPRAZolam (XANAX) 0.5 MG tablet [id number] No TAKE 1 TABLET(0.5 MG) BY MOUTH TWICE DAILY AS NEEDED FOR ANXIETY Bacigalupo, Marzella Schlein, MD Taking Active   ketorolac (TORADOL) 10 MG tablet [id number]  Take 1 tablet (10 mg total) by mouth every 6 (six) hours as needed. Erasmo Downer, MD  Active   leflunomide (ARAVA) 20 MG tablet [id number] No Take 20 mg by mouth daily. [provider] Taking Active   levocetirizine (XYZAL) 5 MG tablet [id number] No Take 1 tablet (5 mg total) by mouth daily as needed. Erasmo Downer, MD Taking Active   levothyroxine (SYNTHROID) 100 MCG tablet [id number] No Take by mouth. [provider] Taking Active   Multiple Vitamin (MULTIVITAMIN) capsule [id number] No Take by mouth. [provider] Taking Active   omeprazole (PRILOSEC OTC) 20 MG tablet [id number] No Take 1 tablet (20 mg total) by mouth daily. Corena Herter, MD Taking Active   rizatriptan (MAXALT-MLT) 10 MG disintegrating tablet [id number] No TAKE 1 TABLET BY MOUTH AS NEEDED FOR MIGRAINE, MAY REPEAT IN 2 HOURS IF NEEDED Bacigalupo, Marzella Schlein, MD Taking Active   SUMAtriptan (IMITREX) 25 MG tablet [id number] No Take 1 tablet (25 mg total) by mouth every 2 (two) hours as needed for migraine. Erasmo Downer, MD Taking Active   traZODone (DESYREL) 50 MG tablet [id number] No TAKE 1/2 TO 1 TABLET(25 TO 50 MG) BY MOUTH AT BEDTIME AS NEEDED FOR SLEEP Bacigalupo, Marzella Schlein, MD Taking Active             Home Care and Equipment/Supplies: Were Home Health Services Ordered?: NA Any new equipment or medical supplies ordered?: NA  Functional Questionnaire: Do you need assistance with bathing/showering or dressing?: No Do you need assistance with meal preparation?: No Do you need assistance with eating?: No Do you have difficulty maintaining continence: No Do you need assistance with getting out of bed/getting out of a chair/moving?: No Do you have difficulty managing or taking your medications?: No  Follow up appointments reviewed: PCP Follow-up appointment confirmed?: No (waiting on staff to call back with appt time) MD Provider Line Number:[phone] Given: No Specialist Hospital Follow-up appointment confirmed?: NA Do you need transportation to your follow-up appointment?: No Do you understand care options if your condition(s) worsen?: Yes-patient verbalized understanding    SIGNATURE Karena Addison, LPN [facility] Nurse Health Advisor Direct Dial [phone]

## 2022-08-13 NOTE — Telephone Encounter (Signed)
I put pt on the schedule and left a vm for her to call back and let us know if the can make this appointment.  I will also try to call pt back later to make sure.

## 2022-08-14 ENCOUNTER — Ambulatory Visit: Payer: Managed Care, Other (non HMO) | Admitting: Family Medicine

## 2022-08-14 ENCOUNTER — Encounter: Payer: Self-pay | Admitting: Family Medicine

## 2022-08-14 VITALS — BP 129/77 | HR 57 | Temp 97.9°F | Resp 13 | Ht 67.0 in | Wt 162.0 lb

## 2022-08-14 DIAGNOSIS — K253 Acute gastric ulcer without hemorrhage or perforation: Secondary | ICD-10-CM | POA: Diagnosis not present

## 2022-08-14 DIAGNOSIS — M329 Systemic lupus erythematosus, unspecified: Secondary | ICD-10-CM

## 2022-08-14 NOTE — Progress Notes (Signed)
Established Patient Office Visit  Subjective   Patient ID: Caroline Mullen, female    DOB: [birth year]  Age: 52 y.o. MRN: [id number]  Chief Complaint  Patient presents with   Follow-up    ED follow up, went to [facility] on 7/5 and [facility] on 7/10    HPI  Discussed the use of AI scribe software for clinical note transcription with the patient, who gave verbal consent to proceed.  History of Present Illness   The patient, with a history of lupus and hypothyroidism, was recently hospitalized (7/10-7/12) at [facility] for epigastric and left upper quadrant pain that had been ongoing for two weeks. The pain was associated with melanoma for forty-eight hours prior to admission, but no bright red blood was observed. The patient had been using meloxicam for hand arthritis. During hospitalization, an EGD revealed a non-bleeding gastric ulcer with a clean ulcer base, small erosions in the duodenal bulb, and congestive gastropathy. The patient was discharged on Protonix daily for twelve weeks and advised to avoid NSAIDs.   Reviewed discharge summary, H&P, labs, imaging from hospitalization. Phone call completed.         ROS per HPI    Objective:     BP 129/77 (BP Location: Left Arm, Patient Position: Sitting, Cuff Size: Normal)   Pulse (!) 57   Temp 97.9 F (36.6 C) (Oral)   Resp 13   Ht 5\' 7"  (1.702 m)   Wt 162 lb (73.5 kg)   SpO2 99%   BMI 25.37 kg/m    Physical Exam Vitals reviewed.  Constitutional:      General: She is not in acute distress.    Appearance: Normal appearance. She is well-developed. She is not diaphoretic.  HENT:     Head: Normocephalic and atraumatic.  Eyes:     General: No scleral icterus.    Conjunctiva/sclera: Conjunctivae normal.  Neck:     Thyroid: No thyromegaly.  Cardiovascular:     Rate and Rhythm: Normal rate and regular rhythm.     Heart sounds: Normal heart sounds. No murmur heard. Pulmonary:     Effort: Pulmonary effort is normal. No respiratory  distress.     Breath sounds: Normal breath sounds. No wheezing, rhonchi or rales.  Abdominal:     General: Bowel sounds are normal. There is no distension.     Palpations: Abdomen is soft.     Tenderness: There is no abdominal tenderness.  Musculoskeletal:     Cervical back: Neck supple.     Right lower leg: No edema.     Left lower leg: No edema.  Lymphadenopathy:     Cervical: No cervical adenopathy.  Skin:    General: Skin is warm and dry.     Findings: No rash.  Neurological:     Mental Status: She is alert and oriented to person, place, and time. Mental status is at baseline.  Psychiatric:        Mood and Affect: Mood normal.        Behavior: Behavior normal.      Results LABS Hb: 10.9 g/dL (40/98/1191)  DIAGNOSTIC EGD: Non-bleeding gastric ulcer with a clean ulcer base, small erosions in the duodenal bulb, congestive gastropathy (08/09/2022) EKG: QT interval normal (08/09/2022)  No results found for any visits on 08/14/22.    The 10-year ASCVD risk score (Arnett DK, et al., 2019) is: 2.6%    Assessment & Plan:   Problem List Items Addressed This Visit  Other   SLE (systemic lupus erythematosus) (HCC)   Relevant Orders   Comprehensive metabolic panel   CBC   Other Visit Diagnoses     Acute gastric ulcer without hemorrhage or perforation    -  Primary   Relevant Orders   Comprehensive metabolic panel   CBC          Gastric Ulcer: Recent hospitalization for epigastric pain and melena. EGD revealed non-bleeding gastric ulcer with a clean base, small erosions in the duodenal bulb, and congestive gastropathy. Patient reports improvement in symptoms. Likely secondary to NSAID use. -Continue Protonix for 12 weeks. -Avoid NSAIDs. -Check hemoglobin today to ensure stability.  Lupus: Patient has not been taking leflunomide due to recent gastric ulcer. Long QT noted on previous EKG, but subsequent EKGs have been normal. -Continue to hold Plaquenil and  discuss with rheumatologist. -Cardiology follow-up scheduled for September, but may cancel if patient continues to feel well.  General Health Maintenance: -Review pathology results from EGD when available. -Regular follow-up appointment scheduled for March.        No follow-ups on file.    Shirlee Latch, MD

## 2022-08-15 LAB — CBC
Hematocrit: 34.3 % (ref 34.0–46.6)
Hemoglobin: 11.3 g/dL (ref 11.1–15.9)
MCH: 30.2 pg (ref 26.6–33.0)
MCHC: 32.9 g/dL (ref 31.5–35.7)
MCV: 92 fL (ref 79–97)
Platelets: 312 10*3/uL (ref 150–450)
RBC: 3.74 x10E6/uL — ABNORMAL LOW (ref 3.77–5.28)
RDW: 13.2 % (ref 11.7–15.4)
WBC: 4 10*3/uL (ref 3.4–10.8)

## 2022-08-15 LAB — COMPREHENSIVE METABOLIC PANEL
ALT: 15 IU/L (ref 0–32)
AST: 18 IU/L (ref 0–40)
Albumin: 4.1 g/dL (ref 3.8–4.9)
Alkaline Phosphatase: 55 IU/L (ref 44–121)
BUN/Creatinine Ratio: 16 (ref 9–23)
BUN: 11 mg/dL (ref 6–24)
Bilirubin Total: 0.4 mg/dL (ref 0.0–1.2)
CO2: 25 mmol/L (ref 20–29)
Calcium: 8.8 mg/dL (ref 8.7–10.2)
Chloride: 103 mmol/L (ref 96–106)
Creatinine, Ser: 0.68 mg/dL (ref 0.57–1.00)
Globulin, Total: 1.9 g/dL (ref 1.5–4.5)
Glucose: 104 mg/dL — ABNORMAL HIGH (ref 70–99)
Potassium: 4.2 mmol/L (ref 3.5–5.2)
Sodium: 140 mmol/L (ref 134–144)
Total Protein: 6 g/dL (ref 6.0–8.5)
eGFR: 104 mL/min/{1.73_m2} (ref >59)

## 2022-08-17 ENCOUNTER — Inpatient Hospital Stay: Payer: Managed Care, Other (non HMO) | Admitting: Family Medicine

## 2022-08-27 ENCOUNTER — Ambulatory Visit: Payer: Self-pay

## 2022-08-27 NOTE — Telephone Encounter (Signed)
  Found lump in breast 2 days ago, please advise     Chief Complaint: Lump left breast. Has appointment next week. Asking if she can be worked in [employer] account [employer]. Will be out of town the rest of the week. Symptoms: Above Frequency: 2 days ago Pertinent Negatives: Patient denies any other symptoms. Disposition: [] ED /[] Urgent Care (no appt availability in office) / [] Appointment(In office/virtual)/ []  [place] [facility]/ [] Home Care/ [] Refused Recommended Disposition /[] [place] [facility]/ []  Follow-up with PCP Additional Notes: Please advise pt.  Reason for Disposition  Breast lump  Answer Assessment - Initial Assessment Questions 1. SYMPTOM: "What's the main symptom you're concerned about?"  (e.g., lump, pain, rash, nipple discharge)     Left 2. LOCATION: "Where is the  located?"     Left breast 3. ONSET: "When did   start?"     2 days ago 4. PRIOR HISTORY: "Do you have any history of prior problems with your breasts?" (e.g., lumps, cancer, fibrocystic breast disease)     No 5. CAUSE: "What do you think is causing this symptom?"     Unsure 6. OTHER SYMPTOMS: "Do you have any other symptoms?" (e.g., fever, breast pain, redness or rash, nipple discharge)     No 7. PREGNANCY-BREASTFEEDING: "Is there any chance you are pregnant?" "When was your last menstrual period?" "Are you breastfeeding?"     No  Protocols used: Breast Symptoms-A-AH

## 2022-08-28 NOTE — Telephone Encounter (Signed)
Noted  

## 2022-09-04 ENCOUNTER — Ambulatory Visit: Payer: Managed Care, Other (non HMO) | Admitting: Family Medicine

## 2022-09-04 ENCOUNTER — Encounter: Payer: Self-pay | Admitting: Family Medicine

## 2022-09-04 VITALS — BP 125/66 | HR 67 | Temp 97.8°F | Resp 12 | Ht 67.0 in | Wt 164.3 lb

## 2022-09-04 DIAGNOSIS — N6324 Unspecified lump in the left breast, lower inner quadrant: Secondary | ICD-10-CM | POA: Diagnosis not present

## 2022-09-04 NOTE — Assessment & Plan Note (Signed)
New  Likely related to scar tissue due to its location along incision sites  Referral for diagnostic mammogram and [place]

## 2022-09-04 NOTE — Progress Notes (Signed)
   Established Patient Office Visit  Subjective   Patient ID: Caroline Mullen, female    DOB: Sep 20, 1970  Age: 52 y.o. MRN: [id number]  Chief Complaint  Patient presents with   Breast Problem    Lump in left breast x 1 week.     Caroline Mullen is here w/ a one week history of a new lump in her L breast. She noticed this incidentally. She denies tenderness, warmth, or redness to the area. She denies nipple discharge. She endorses a recent breast reduction surgery (within the past year). She believes the lump may be related to scar tissue but wanted to come in just to be safe. She denies a family history of any breast disease or cancer. She is up to date on mammogram screenings.    Review of Systems  All other systems reviewed and are negative.    Objective:     BP 125/66 (BP Location: Right Arm, Patient Position: Sitting, Cuff Size: Normal)   Pulse 67   Temp 97.8 F (36.6 C) (Temporal)   Resp 12   Ht 5\' 7"  (1.702 m)   Wt 164 lb 4.8 oz (74.5 kg)   SpO2 99%   BMI 25.73 kg/m   Physical Exam Constitutional:      Appearance: Normal appearance.  Chest:  Breasts:    Right: Normal.     Left: Mass (Pepple like mass located at the 6 oclock position) present. No swelling, nipple discharge, skin change or tenderness.  Neurological:     Mental Status: She is alert.    No results found for any visits on 09/04/22.   The 10-year ASCVD risk score (Arnett DK, et al., 2019) is: 2.6%    Assessment & Plan:   Problem List Items Addressed This Visit       Other   Mass of lower inner quadrant of left breast - Primary    New  Likely related to scar tissue due to its location along incision sites  Referral for diagnostic mammogram and [place]          Relevant Orders   MM 3D DIAGNOSTIC MAMMOGRAM UNILATERAL LEFT BREAST   US LIMITED ULTRASOUND INCLUDING AXILLA LEFT BREAST     No follow-ups on file.    Caroline Mullen, Medical Student  Patient seen along with MS3 student Jodi Marble. I personally evaluated this patient along with the student, and verified all aspects of the history, physical exam, and medical decision making as documented by the student. I agree with the student's documentation and have made all necessary edits.  Hansika Leaming, Marzella Schlein, MD, MPH [facility]

## 2022-09-06 ENCOUNTER — Ambulatory Visit
Admission: RE | Admit: 2022-09-06 | Discharge: 2022-09-06 | Disposition: A | Discharge status: Home / Self Care | Payer: Managed Care, Other (non HMO) | Source: Ambulatory Visit | Attending: Family Medicine | Admitting: Family Medicine

## 2022-09-06 DIAGNOSIS — N6324 Unspecified lump in the left breast, lower inner quadrant: Secondary | ICD-10-CM | POA: Diagnosis present

## 2022-10-19 ENCOUNTER — Ambulatory Visit: Payer: Managed Care, Other (non HMO) | Admitting: Cardiology

## 2023-03-26 ENCOUNTER — Ambulatory Visit: Payer: Managed Care, Other (non HMO) | Admitting: Podiatry

## 2023-03-26 ENCOUNTER — Encounter: Payer: Self-pay | Admitting: Podiatry

## 2023-03-26 DIAGNOSIS — L600 Ingrowing nail: Secondary | ICD-10-CM | POA: Diagnosis not present

## 2023-03-26 NOTE — Progress Notes (Signed)
 Chief Complaint  Patient presents with   Nail Problem    "I have an toenail that I had removed years ago.  Since then, I have dropped several things on it and my nail has fallen off and grew back.  I want to make sure I don't have fungus." N - fungus L - 1st, 2nd right  D - 6 mos O - suddenly C - thick, breaks off, a little more yellow than the others A - none T - none    Subjective: Patient presents today for evaluation of pain to the medial border of the right great toe. Patient is concerned for possible ingrown nail.  It is very sensitive to touch.  She also has a history of trauma to the right great toenail plate.  She says that the toenail does not go normal anymore.  Patient presents today for further treatment and evaluation.  Past Medical History:  Diagnosis Date   Anxiety    Connective tissue disease (HCC)    on Plaquenil    NO LONGER ON MEDS NO PROBLEMS X 6 YEARS   Graves disease    IRRITABLE BOWEL SYNDROME, HX OF 04/22/2006   Qualifier: History of  By: Milinda Antis MD, Colon Flattery    Lupus    Migraine    Thyroid nodule 02/19/2011    Past Surgical History:  Procedure Laterality Date   ABDOMINAL HYSTERECTOMY     fibroid uterine tumors supracervical   BREAST REDUCTION SURGERY Bilateral 07/2021   CESAREAN SECTION     HEMORRHOID BANDING     KNEE ARTHROSCOPY     SHOULDER SURGERY  1998   THYROIDECTOMY N/A 08/08/2015   Procedure: THYROIDECTOMY;  Surgeon: Vernie Murders, MD;  Location: [facility] ORS;  Service: ENT;  Laterality: N/A;   TUBAL LIGATION      Allergies  Allergen Reactions   Morphine Hives    REACTION: rash    Objective:  General: Well developed, nourished, in no acute distress, alert and oriented x3   Dermatology: Skin is warm, dry and supple bilateral.  Medial border right great toe is tender with evidence of an ingrowing nail. Pain on palpation noted to the border of the nail fold.  Nail dystrophy noted to the right hallux  Vascular: DP and PT pulses palpable.   No clinical evidence of vascular compromise  Neruologic: Grossly intact via light touch bilateral.  Musculoskeletal: No pedal deformity noted  Assesement: #1 Paronychia with ingrowing nail border right great toe #2 dystrophic nail right great toe  Plan of Care:  -Patient evaluated.  -Discussed treatment alternatives and plan of care. Explained nail avulsion procedure and post procedure course to patient. -Patient opted for permanent partial nail avulsion of the ingrown portion of the nail.  -Prior to procedure, local anesthesia infiltration utilized using 3 ml of a 50:50 mixture of 2% plain lidocaine and 0.5% plain marcaine in a normal hallux block fashion and a betadine prep performed.  -Partial permanent nail avulsion with chemical matrixectomy performed using 3x30sec applications of phenol followed by alcohol flush.  -Light dressing applied.  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 Felecia Shelling, DPM    [address]  [place], [place] [address]                Office [phone]  Fax [phone]

## 2023-03-26 NOTE — Patient Instructions (Addendum)

## 2023-04-01 ENCOUNTER — Ambulatory Visit (INDEPENDENT_AMBULATORY_CARE_PROVIDER_SITE_OTHER): Payer: Managed Care, Other (non HMO) | Admitting: Family Medicine

## 2023-04-01 VITALS — BP 136/80 | HR 79 | Ht 67.0 in | Wt 171.8 lb

## 2023-04-01 DIAGNOSIS — F411 Generalized anxiety disorder: Secondary | ICD-10-CM

## 2023-04-01 DIAGNOSIS — E538 Deficiency of other specified B group vitamins: Secondary | ICD-10-CM

## 2023-04-01 DIAGNOSIS — Z0000 Encounter for general adult medical examination without abnormal findings: Secondary | ICD-10-CM | POA: Diagnosis not present

## 2023-04-01 DIAGNOSIS — E890 Postprocedural hypothyroidism: Secondary | ICD-10-CM

## 2023-04-01 DIAGNOSIS — R7303 Prediabetes: Secondary | ICD-10-CM

## 2023-04-01 DIAGNOSIS — Z1231 Encounter for screening mammogram for malignant neoplasm of breast: Secondary | ICD-10-CM

## 2023-04-01 DIAGNOSIS — E782 Mixed hyperlipidemia: Secondary | ICD-10-CM

## 2023-04-01 DIAGNOSIS — E559 Vitamin D deficiency, unspecified: Secondary | ICD-10-CM | POA: Diagnosis not present

## 2023-04-01 DIAGNOSIS — G4700 Insomnia, unspecified: Secondary | ICD-10-CM

## 2023-04-01 MED ORDER — ALPRAZOLAM 0.5 MG PO TABS: 0.5000 mg | ORAL_TABLET | Freq: Two times a day (BID) | ORAL | 1 refills | Status: AC | PRN

## 2023-04-01 MED ORDER — TRAZODONE HCL 50 MG PO TABS: ORAL_TABLET | ORAL | 3 refills | Status: DC

## 2023-04-01 NOTE — Patient Instructions (Addendum)
 Please have vaccine information faxed to our office at [phone]. Be sure lot # and date administered are available for proper documentation.   Call [facility] to schedule a mammogram [phone]

## 2023-04-01 NOTE — Progress Notes (Unsigned)
 Complete physical exam   [name]   DOB: [birth year]   53 y.o. Female  MRN: [id number] Visit Date: 04/01/2023  Today's healthcare provider: Shirlee Latch, MD   Chief Complaint  Patient presents with  . Annual Exam    Last completed 03/05/22 Diet -  General, well balanced diet consuming 3 meals a day Exercise - twice a week sometimes three for twenty minutes Feeling - well Sleeping - fairly well with difficulty going back to sleep Concerns - none   . Care Management    Tetanus Vaccine - will request records Zoster Vaccines - declined HIV Screening - declined   Subjective    Caroline Mullen is a 53 y.o. female who presents today for a complete physical exam.   Discussed the use of AI scribe software for clinical note transcription with the patient, who gave verbal consent to proceed.  History of Present Illness             08/07/2022   11:21 AM 03/20/2019    3:41 PM  GAD 7 : Generalized Anxiety Score  Nervous, Anxious, on Edge 1 1  Control/stop worrying 0 1  Worry too much - different things 1 1  Trouble relaxing 1 1  Restless 0 0  Easily annoyed or irritable 0 0  Afraid - awful might happen 1 0  Total GAD 7 Score 4 4  Anxiety Difficulty Not difficult at all Not difficult at all      Last depression screening scores    09/04/2022    9:09 AM 08/07/2022   10:08 AM 03/05/2022    3:18 PM  PHQ 2/9 Scores  PHQ - 2 Score 0 0 0  PHQ- 9 Score 0 3 0   Last fall risk screening    09/04/2022    9:08 AM  Fall Risk   Falls in the past year? 0  Number falls in past yr: 0  Injury with Fall? 0  Risk for fall due to : No Fall Risks  Follow up Falls evaluation completed    {VISON DENTAL STD PSA (Optional):27386}  {History (Optional):23778}  Medications: Outpatient Medications Prior to Visit  Medication Sig  . CLIMARA PRO 0.045-0.015 MG/DAY 1 patch once a week.  . leflunomide (ARAVA) 20 MG tablet Take 20 mg by mouth daily.  Marland Kitchen levocetirizine  (XYZAL) 5 MG tablet Take 1 tablet (5 mg total) by mouth daily as needed.  Marland Kitchen levothyroxine (SYNTHROID) 100 MCG tablet Take by mouth.  . Multiple Vitamin (MULTIVITAMIN) capsule Take by mouth.  . rizatriptan (MAXALT-MLT) 10 MG disintegrating tablet TAKE 1 TABLET BY MOUTH AS NEEDED FOR MIGRAINE, MAY REPEAT IN 2 HOURS IF NEEDED  . SUMAtriptan (IMITREX) 25 MG tablet Take 1 tablet (25 mg total) by mouth every 2 (two) hours as needed for migraine.  . [DISCONTINUED] ALPRAZolam (XANAX) 0.5 MG tablet TAKE 1 TABLET(0.5 MG) BY MOUTH TWICE DAILY AS NEEDED FOR ANXIETY  . [DISCONTINUED] traZODone (DESYREL) 50 MG tablet TAKE 1/2 TO 1 TABLET(25 TO 50 MG) BY MOUTH AT BEDTIME AS NEEDED FOR SLEEP   No facility-administered medications prior to visit.    Review of Systems {Insert previous labs (optional):23779} {See past labs  Heme  Chem  Endocrine  Serology  Results Review (optional):1}  Objective    BP 136/80 (BP Location: Right Arm, Patient Position: Sitting, Cuff Size: Normal)   Pulse 79   Ht 5\' 7"  (1.702 m)   Wt 171 lb 12.8 oz (77.9 kg)  BMI 26.91 kg/m  {Insert last BP/Wt (optional):23777}{See vitals history (optional):1}  Physical Exam   No results found for any visits on 04/01/23.  Assessment & Plan    Routine Health Maintenance and Physical Exam  Exercise Activities and Dietary recommendations  Goals   None     Immunization History  Administered Date(s) Administered  . Influenza Whole 10/30/2007, 03/01/2009  . Td 09/29/2004    Health Maintenance  Topic Date Due  . COVID-19 Vaccine (1) Never done  . HIV Screening  Never done  . Zoster Vaccines- Shingrix (1 of 2) Never done  . DTaP/Tdap/Td (2 - Tdap) 09/30/2014  . INFLUENZA VACCINE  04/29/2023 (Originally 08/30/2022)  . MAMMOGRAM  05/07/2024  . Cervical Cancer Screening (HPV/Pap Cotest)  02/07/2025  . Fecal DNA (Cologuard)  04/22/2025  . Hepatitis C Screening  Completed  . HPV VACCINES  Aged Out  . Colonoscopy  Discontinued     Discussed health benefits of physical activity, and encouraged her to engage in regular exercise appropriate for her age and condition.  Problem List Items Addressed This Visit       Endocrine   Postoperative hypothyroidism   Relevant Orders   TSH     Other   Avitaminosis D   Relevant Orders   VITAMIN D 25 Hydroxy (Vit-D Deficiency, Fractures)   HYPERLIPIDEMIA   Relevant Orders   Comprehensive metabolic panel   Lipid panel   GAD (generalized anxiety disorder)   Relevant Medications   ALPRAZolam (XANAX) 0.5 MG tablet   traZODone (DESYREL) 50 MG tablet   B12 deficiency   Relevant Orders   B12   Prediabetes   Relevant Orders   Hemoglobin A1c   Insomnia   Other Visit Diagnoses       Encounter for annual physical exam    -  Primary   Relevant Orders   TSH   Comprehensive metabolic panel   Lipid panel   Hemoglobin A1c   VITAMIN D 25 Hydroxy (Vit-D Deficiency, Fractures)   B12     Breast cancer screening by mammogram       Relevant Orders   MM 3D SCREENING MAMMOGRAM BILATERAL BREAST                   Return in about 1 year (around 03/31/2024) for CPE.     Shirlee Latch, MD  [facility] [phone] (phone) [phone] (fax)  [facility]

## 2023-04-02 ENCOUNTER — Encounter: Payer: Self-pay | Admitting: Family Medicine

## 2023-04-02 LAB — COMPREHENSIVE METABOLIC PANEL
ALT: 17 IU/L (ref 0–32)
AST: 17 IU/L (ref 0–40)
Albumin: 4.3 g/dL (ref 3.8–4.9)
Alkaline Phosphatase: 76 IU/L (ref 44–121)
BUN/Creatinine Ratio: 36 — ABNORMAL HIGH (ref 9–23)
BUN: 29 mg/dL — ABNORMAL HIGH (ref 6–24)
Bilirubin Total: 0.4 mg/dL (ref 0.0–1.2)
CO2: 25 mmol/L (ref 20–29)
Calcium: 9.6 mg/dL (ref 8.7–10.2)
Chloride: 102 mmol/L (ref 96–106)
Creatinine, Ser: 0.8 mg/dL (ref 0.57–1.00)
Globulin, Total: 2.4 g/dL (ref 1.5–4.5)
Glucose: 105 mg/dL — ABNORMAL HIGH (ref 70–99)
Potassium: 4.2 mmol/L (ref 3.5–5.2)
Sodium: 138 mmol/L (ref 134–144)
Total Protein: 6.7 g/dL (ref 6.0–8.5)
eGFR: 88 mL/min/{1.73_m2} (ref >59)

## 2023-04-02 LAB — HEMOGLOBIN A1C
Est. average glucose Bld gHb Est-mCnc: 134 mg/dL
Hgb A1c MFr Bld: 6.3 % — ABNORMAL HIGH (ref 4.8–5.6)

## 2023-04-02 LAB — VITAMIN B12: Vitamin B-12: 341 pg/mL (ref 232–1245)

## 2023-04-02 LAB — LIPID PANEL
Chol/HDL Ratio: 8 ratio — ABNORMAL HIGH (ref 0.0–4.4)
Cholesterol, Total: 201 mg/dL — ABNORMAL HIGH (ref 100–199)
HDL: 25 mg/dL — ABNORMAL LOW (ref >39)
LDL Chol Calc (NIH): 105 mg/dL — ABNORMAL HIGH (ref 0–99)
Triglycerides: 414 mg/dL — ABNORMAL HIGH (ref 0–149)
VLDL Cholesterol Cal: 71 mg/dL — ABNORMAL HIGH (ref 5–40)

## 2023-04-02 LAB — VITAMIN D 25 HYDROXY (VIT D DEFICIENCY, FRACTURES): Vit D, 25-Hydroxy: 26.3 ng/mL — ABNORMAL LOW (ref 30.0–100.0)

## 2023-04-02 LAB — TSH: TSH: 1.05 u[IU]/mL (ref 0.450–4.500)

## 2023-04-02 NOTE — Assessment & Plan Note (Signed)
 Managed with Trazodone as needed. Prefers as-needed use.

## 2023-04-02 NOTE — Assessment & Plan Note (Signed)
 Managed with Synthroid 100 mcg daily. Under specialist care. Blood work to include thyroid function test.

## 2023-04-02 NOTE — Assessment & Plan Note (Signed)
 Reviewed last lipid panel Not currently on a statin Recheck FLP and CMP Discussed diet and exercise

## 2023-04-02 NOTE — Assessment & Plan Note (Signed)
 Recommend low carb diet Recheck A1c

## 2023-04-02 NOTE — Assessment & Plan Note (Signed)
 Intermittent anxiety influenced by personal life stressors, not requiring daily medication. Prefers as-needed use of Xanax, last refilled in December 2023. - Refill Xanax prescription

## 2023-04-08 ENCOUNTER — Other Ambulatory Visit: Payer: Self-pay | Admitting: Family Medicine

## 2023-04-09 NOTE — Telephone Encounter (Signed)
 Requested Prescriptions  Pending Prescriptions Disp Refills   rizatriptan (MAXALT-MLT) 10 MG disintegrating tablet [Pharmacy Med Name: RIZATRIPTAN ODT 10MG  TABLETS] 10 tablet 3    Sig: DISSOLVE ONE TABLET BY MOUTH AS NEEDED FOR MIGRAINE. MAY REPEAT DOSE IN 2 HOURS IF NEEDED     Neurology:  Migraine Therapy - Triptan Passed - 04/09/2023  3:29 PM      Passed - Last BP in normal range    BP Readings from Last 1 Encounters:  04/01/23 136/80         Passed - Valid encounter within last 12 months    Recent Outpatient Visits           7 months ago Mass of lower inner quadrant of left breast   [facility] Erasmo Downer, MD   7 months ago Acute gastric ulcer without hemorrhage or perforation   [facility] [place], Marzella Schlein, MD   8 months ago Chest pain, unspecified type   [facility] [place], Marzella Schlein, MD   1 year ago Encounter for annual physical exam   [facility] [place], Marzella Schlein, MD   1 year ago Left upper quadrant abdominal pain    [facility] [place], [place], PA-C       Future Appointments             In 1 year Bac[place]alupo, Marzella Schlein, MD [facility]

## 2023-04-19 ENCOUNTER — Encounter: Payer: Self-pay | Admitting: Podiatry

## 2023-04-19 ENCOUNTER — Ambulatory Visit: Payer: Managed Care, Other (non HMO) | Admitting: Podiatry

## 2023-04-19 DIAGNOSIS — L600 Ingrowing nail: Secondary | ICD-10-CM

## 2023-04-19 NOTE — Progress Notes (Signed)
   Chief Complaint  Patient presents with   Nail Problem    "It's doing better now.  I think it's healing now.  I just want him to check it and make sure it's not infected."    Subjective: 53 y.o. female presents today status post permanent nail avulsion procedure of the medial border right great toe that was performed on 03/26/2023.  Patient doing well..   Past Medical History:  Diagnosis Date   Allergy    Morphine  & seasonal allergies   Anxiety    Connective tissue disease (HCC)    on Plaquenil    NO LONGER ON MEDS NO PROBLEMS X 6 YEARS   Graves disease    IRRITABLE BOWEL SYNDROME, HX OF 04/22/2006   Qualifier: History of  By: Tower MD, Colon Flattery    Lupus    Migraine    Sleep apnea 2022   Maybe? Not sure   Thyroid nodule 02/19/2011    Objective: Neurovascular status intact.  Skin is warm, dry and supple. Nail and respective nail fold appears to be healing appropriately.   Assessment: #1 s/p partial permanent nail matrixectomy medial border right great toe   Plan of care: #1 patient was evaluated  #2 light debridement of the periungual debris was performed to the border of the respective toe and nail plate using a tissue nipper. #3 patient is to return to clinic on a PRN basis.  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 Felecia Shelling, DPM    [address] [place], [place] [address]                Office [phone]  Fax [phone]

## 2023-05-07 ENCOUNTER — Telehealth: Payer: Self-pay | Admitting: Family Medicine

## 2023-05-07 NOTE — Telephone Encounter (Unsigned)
 Copied from CRM [id number]. Topic: Clinical - Request for Lab/Test Order >> May 07, 2023 10:58 AM Caroline Mullen wrote: Reason for CRM:  New lump in left breast and patient wants to request her yearly mammo

## 2023-05-16 ENCOUNTER — Encounter: Payer: Self-pay | Admitting: Family Medicine

## 2023-05-16 ENCOUNTER — Ambulatory Visit
Admission: RE | Admit: 2023-05-16 | Discharge: 2023-05-16 | Disposition: A | Discharge status: Home / Self Care | Source: Ambulatory Visit | Attending: Family Medicine | Admitting: Family Medicine

## 2023-05-16 DIAGNOSIS — Z1231 Encounter for screening mammogram for malignant neoplasm of breast: Secondary | ICD-10-CM | POA: Diagnosis present

## 2023-06-03 ENCOUNTER — Other Ambulatory Visit: Payer: Self-pay | Admitting: Family Medicine

## 2023-09-16 ENCOUNTER — Encounter: Payer: Self-pay | Admitting: Family Medicine

## 2023-09-16 ENCOUNTER — Ambulatory Visit: Admitting: Family Medicine

## 2023-09-16 VITALS — BP 128/73 | HR 82 | Ht 67.0 in | Wt 171.0 lb

## 2023-09-16 DIAGNOSIS — E782 Mixed hyperlipidemia: Secondary | ICD-10-CM

## 2023-09-16 DIAGNOSIS — G4700 Insomnia, unspecified: Secondary | ICD-10-CM

## 2023-09-16 DIAGNOSIS — M329 Systemic lupus erythematosus, unspecified: Secondary | ICD-10-CM | POA: Diagnosis not present

## 2023-09-16 DIAGNOSIS — M25472 Effusion, left ankle: Secondary | ICD-10-CM | POA: Diagnosis not present

## 2023-09-16 DIAGNOSIS — R7303 Prediabetes: Secondary | ICD-10-CM

## 2023-09-16 DIAGNOSIS — R7401 Elevation of levels of liver transaminase levels: Secondary | ICD-10-CM

## 2023-09-16 DIAGNOSIS — M7742 Metatarsalgia, left foot: Secondary | ICD-10-CM

## 2023-09-16 MED ORDER — DULOXETINE HCL 20 MG PO CPEP: 20.0000 mg | ORAL_CAPSULE | Freq: Every day | ORAL | 3 refills | Status: DC

## 2023-09-16 NOTE — Progress Notes (Signed)
 Established Patient Office Visit  Subjective   Patient ID: Caroline Mullen, female    DOB: [birth year]  Age: 53 y.o. MRN: [id number]  Chief Complaint  Patient presents with   Results    Discuss lab results from previous labs and see if she needs to have them repeated,    Swelling    L ankle swelling thinks it has something to do with lab reading, her ankle has been swollen for about a month it dont bother her     Caroline Mullen is a 53 yo F with a history of HLD, prediabetes, and SLE who presents to the clinic today to discuss recent lab results and left ankle swelling.  On interview, she states that she has had labs done at three different places within the last few months and wanted a second opinion on abnormal lab results (ALT and triglycerides) that she has had. She reports spending time reading about these lab results on the internet and is concerned about fatty liver disease. Additionally, she reports experiencing a 1 month history of left ankle swelling. She denies any systemic symptoms and wonders if her symptoms are related to her liver function or SLE. She also reports concern regarding her insomnia. She states that her trazodone  helps her occasionally, but has been having unwanted side effects and wanted to discuss other therapies to help improve her sleep.      ROS    Objective:     BP 128/73   Pulse 82   Ht 5' 7 (1.702 m)   Wt 171 lb (77.6 kg)   SpO2 98%   BMI 26.78 kg/m    Physical Exam Vitals reviewed.  Constitutional:      General: She is not in acute distress.    Appearance: Normal appearance. She is not ill-appearing or diaphoretic.  HENT:     Head: Normocephalic.     Right Ear: External ear normal.     Left Ear: External ear normal.     Nose: Nose normal.     Mouth/Throat:     Mouth: Mucous membranes are moist.     Pharynx: Oropharynx is clear.  Eyes:     Conjunctiva/sclera: Conjunctivae normal.  Cardiovascular:     Rate and Rhythm: Normal  rate and regular rhythm.     Pulses: Normal pulses.     Heart sounds: Normal heart sounds. No murmur heard.    No friction rub. No gallop.  Pulmonary:     Effort: Pulmonary effort is normal. No respiratory distress.     Breath sounds: Normal breath sounds. No wheezing.  Abdominal:     Palpations: Abdomen is soft.  Musculoskeletal:     Cervical back: Normal range of motion.     Right lower leg: No edema.     Left lower leg: No edema.     Right ankle: Normal.     Left ankle: Swelling present. No deformity. No tenderness. Normal range of motion.     Comments: Loss of transverse arch in the left foot  Skin:    General: Skin is warm and dry.     Capillary Refill: Capillary refill takes less than 2 seconds.  Neurological:     Mental Status: She is alert and oriented to person, place, and time.  Psychiatric:        Mood and Affect: Mood normal.      No results found for any visits on 09/16/23.    The 10-year ASCVD risk score (Arnett DK,  et al., 2019) is: 4%    Assessment & Plan:   Problem List Items Addressed This Visit     HYPERLIPIDEMIA - Primary   Currently managed with lifestyle modifications. Discussed recent FLPs from office and [facility], especially in the context of prediabetes to help ease patient's worry. Last FLP showed elevated triglycerides - Continue lifestyle modifications - Recheck FLP and CMP at next annual      SLE (systemic lupus erythematosus) (HCC)   Currently managed by rheumatology. Reports having a recent flare 1.5 months ago, but states that her symptoms are largely managed - Continue follow up with rheum as scheduled      Prediabetes   Currently managed through diet and exercise. Discussed how prediabetes can affect triglyceride levels that the patient had concerns about. - Continue lifestyle modifications - Reassess A1C at next visit      Insomnia   Currently not well managed. Patient reports using trazodone  as needed to help with sleep, but  states that she still often wakes up in the middle of the night due to hot flashes. She also states that trazodone  has been causing sexual dysfunction. - Discussed sleep hygiene habits - Stop trazodone  due to side effects - Discussed Ramelteon, melatonin and magnesium  use to help with sleep - Start Duloxetine  daily to help with anxiety, hot flashes, and insomnia      Other Visit Diagnoses       Left ankle swelling         Elevated alanine aminotransferase (ALT) level       Relevant Orders   Comprehensive Metabolic Panel (CMET)     Metatarsalgia of left foot          Metatarsalgia of left foot Left ankle swelling Patient presents with left ankle swelling and left foot pain for the last month. Reports having a lupus flare approximately 1.5 months ago. Denies having any trauma, systemic symptoms, pain, redness, or warmth. Also denies changes in sensation or strength in the ankle. On exam, loss of transverse arch on left foot. Likely etiology is metatarsalgia with associated swelling - Discussed use of cushioning when walking to help support loss of transverse arch - Return if symptoms worsen  Elevated ALT level Patient presents with mild ALT elevated from recent labs in early July. Reports concern and would like reassurance if further steps are needed. Has a history of lupus and on leflunomide. Recent ALTs have been wnl and AST wnl. Denies any other systemic symptoms or worsening of baseline SLE symptoms outside of flare 1.5 months ago. - Order CMP to reassess LFTs - Discussed meaning of elevated ALT, providing reassurance  General Health Maintenance Discussed wellness and preventative health screenings to achieve health maintenance goals. - Encouraged flu and Covid vaccinations in the fall - Discussed TDAP, shingles, and pneumonia vaccinations - patient declined all - Discussed Hep B and HIV screening - patient declined all - Up to date on other wellness measures and preventative  screenings   No follow-ups on file.    Caroline Mullen, Medical Student  Patient seen along with MS3 student, Caroline Mullen. I personally evaluated this patient along with the student, and verified all aspects of the history, physical exam, and medical decision making as documented by the student. I agree with the student's documentation and have made all necessary edits.  Caroline Mullen, Jon HERO, MD, MPH [facility]

## 2023-09-16 NOTE — Assessment & Plan Note (Addendum)
 Currently managed through diet and exercise. Discussed how prediabetes can affect triglyceride levels that the patient had concerns about. - Continue lifestyle modifications - Reassess A1C at next visit

## 2023-09-16 NOTE — Assessment & Plan Note (Signed)
 Currently managed with lifestyle modifications. Discussed recent FLPs from office and [facility], especially in the context of prediabetes to help ease patient's worry. Last FLP showed elevated triglycerides - Continue lifestyle modifications - Recheck FLP and CMP at next annual

## 2023-09-16 NOTE — Assessment & Plan Note (Addendum)
 Currently not well managed. Patient reports using trazodone  as needed to help with sleep, but states that she still often wakes up in the middle of the night due to hot flashes. She also states that trazodone  has been causing sexual dysfunction. - Discussed sleep hygiene habits - Stop trazodone  due to side effects - Discussed Ramelteon, melatonin and magnesium  use to help with sleep - Start Duloxetine  daily to help with anxiety, hot flashes, and insomnia

## 2023-09-16 NOTE — Assessment & Plan Note (Signed)
 Currently managed by rheumatology. Reports having a recent flare 1.5 months ago, but states that her symptoms are largely managed - Continue follow up with rheum as scheduled

## 2023-09-17 ENCOUNTER — Ambulatory Visit: Payer: Self-pay | Admitting: Family Medicine

## 2023-09-17 ENCOUNTER — Other Ambulatory Visit: Payer: Self-pay

## 2023-09-17 DIAGNOSIS — R748 Abnormal levels of other serum enzymes: Secondary | ICD-10-CM

## 2023-09-17 LAB — COMPREHENSIVE METABOLIC PANEL WITH GFR
ALT: 33 IU/L — ABNORMAL HIGH (ref 0–32)
AST: 26 IU/L (ref 0–40)
Albumin: 4.2 g/dL (ref 3.8–4.9)
Alkaline Phosphatase: 63 IU/L (ref 44–121)
BUN/Creatinine Ratio: 17 (ref 9–23)
BUN: 13 mg/dL (ref 6–24)
Bilirubin Total: 0.7 mg/dL (ref 0.0–1.2)
CO2: 21 mmol/L (ref 20–29)
Calcium: 9 mg/dL (ref 8.7–10.2)
Chloride: 102 mmol/L (ref 96–106)
Creatinine, Ser: 0.78 mg/dL (ref 0.57–1.00)
Globulin, Total: 2.1 g/dL (ref 1.5–4.5)
Glucose: 104 mg/dL — ABNORMAL HIGH (ref 70–99)
Potassium: 4.1 mmol/L (ref 3.5–5.2)
Sodium: 137 mmol/L (ref 134–144)
Total Protein: 6.3 g/dL (ref 6.0–8.5)
eGFR: 90 mL/min/1.73 (ref >59)

## 2023-09-17 NOTE — Telephone Encounter (Signed)
 Copied from CRM #[id number]. Topic: Clinical - Lab/Test Results >> Sep 17, 2023 12:20 PM Berwyn MATSU wrote: Reason for CRM:  Patient called back regarding her labs. Per Patient she received a voicemail and was told to call back.  May you please assist. >> Sep 17, 2023 12:25 PM Emylou G wrote: Adv patient of lab results

## 2023-10-07 ENCOUNTER — Ambulatory Visit: Admitting: Podiatry

## 2023-10-08 ENCOUNTER — Ambulatory Visit (INDEPENDENT_AMBULATORY_CARE_PROVIDER_SITE_OTHER)

## 2023-10-08 ENCOUNTER — Ambulatory Visit: Admitting: Podiatry

## 2023-10-08 ENCOUNTER — Encounter: Payer: Self-pay | Admitting: Podiatry

## 2023-10-08 VITALS — Ht 67.0 in | Wt 171.0 lb

## 2023-10-08 DIAGNOSIS — M7752 Other enthesopathy of left foot: Secondary | ICD-10-CM | POA: Diagnosis not present

## 2023-10-08 DIAGNOSIS — M7751 Other enthesopathy of right foot: Secondary | ICD-10-CM

## 2023-10-08 DIAGNOSIS — M7741 Metatarsalgia, right foot: Secondary | ICD-10-CM | POA: Diagnosis not present

## 2023-10-08 MED ORDER — METHYLPREDNISOLONE 4 MG PO TBPK: ORAL_TABLET | ORAL | 0 refills | Status: DC

## 2023-10-08 MED ORDER — MELOXICAM 15 MG PO TABS: 15.0000 mg | ORAL_TABLET | Freq: Every day | ORAL | 1 refills | Status: DC

## 2023-10-08 NOTE — Progress Notes (Signed)
 Chief Complaint  Patient presents with   Foot Pain    Pt is here due to left foot arch pain, has been going on for over a month, seen her PCP for this issue and was told her arch has collapsed and nothing could be done about it, has been having a cramping pain in the arch of the foot, wanted to get second opinion.     HPI: 53 y.o. female presenting today for new complaint of pain and tenderness associated to the left forefoot.  Ongoing for a few months now.  Idiopathic onset.  She does admit to walking around the house barefoot the majority of the day.  Past Medical History:  Diagnosis Date   Allergy    Morphine  & seasonal allergies   Anxiety    Connective tissue disease (HCC)    on Plaquenil    NO LONGER ON MEDS NO PROBLEMS X 6 YEARS   Graves disease    IRRITABLE BOWEL SYNDROME, HX OF 04/22/2006   Qualifier: History of  By: Tower MD, Laine Caldron    Lupus    Migraine    Sleep apnea 2022   Maybe? Not sure   Thyroid  nodule 02/19/2011    Past Surgical History:  Procedure Laterality Date   ABDOMINAL HYSTERECTOMY     fibroid uterine tumors supracervical   BREAST REDUCTION SURGERY Bilateral 07/2021   BREAST SURGERY  07/2021   Reduction   CESAREAN SECTION     EYE SURGERY  5/21   HEMORRHOID BANDING     KNEE ARTHROSCOPY     SHOULDER SURGERY  1998   THYROIDECTOMY N/A 08/08/2015   Procedure: THYROIDECTOMY;  Surgeon: Deward Argue, MD;  Location: [facility] ORS;  Service: ENT;  Laterality: N/A;   TUBAL LIGATION      Allergies  Allergen Reactions   Morphine Hives    REACTION: rash   Estradiol-Norethindrone Acet     Other Reaction(s): swelling knees   Morphine Sulfate Rash     Physical Exam: General: The patient is alert and oriented x3 in no acute distress.  Dermatology: Skin is warm, dry and supple bilateral lower extremities.   Vascular: Palpable pedal pulses bilaterally. Capillary refill within normal limits.  No appreciable edema.  No erythema.  Neurological: Grossly  intact via light touch  Musculoskeletal Exam: Generalized tenderness throughout palpation to the right forefoot  Radiographic Exam RT foot 10/08/2023:  Normal osseous mineralization. Joint spaces preserved.  No fractures or osseous irregularities noted.  Impression: Negative  Assessment/Plan of Care: 1.  Metatarsalgia right forefoot  -Patient evaluated.  X-rays reviewed -She does admit to walking around the house the majority of the day barefoot on hardwood floors.  She says that the pain is alleviated significantly when she wears her [place] tennis shoes. -For now advised against going barefoot.  Recommend good supportive tennis shoes or slides around the house -Prescription for Medrol  Dosepak -Prescription for meloxicam  15 mg daily after completion of the Dosepak -Return to clinic 4 weeks       Thresa EMERSON Sar, DPM [facility]  Dr. Thresa EMERSON Sar, DPM    [address], [place] [address]                Office [phone]  Fax [phone]

## 2023-10-22 ENCOUNTER — Ambulatory Visit: Payer: Self-pay | Admitting: Family Medicine

## 2023-10-22 LAB — HEPATIC FUNCTION PANEL
ALT: 24 IU/L (ref 0–32)
AST: 20 IU/L (ref 0–40)
Albumin: 4.3 g/dL (ref 3.8–4.9)
Alkaline Phosphatase: 63 IU/L (ref 49–135)
Bilirubin Total: 0.5 mg/dL (ref 0.0–1.2)
Bilirubin, Direct: 0.16 mg/dL (ref 0.00–0.40)
Total Protein: 6.2 g/dL (ref 6.0–8.5)

## 2023-11-05 ENCOUNTER — Ambulatory Visit: Admitting: Podiatry

## 2023-11-26 ENCOUNTER — Ambulatory Visit
Admission: RE | Admit: 2023-11-26 | Discharge: 2023-11-26 | Disposition: A | Discharge status: Home / Self Care | Source: Ambulatory Visit | Attending: Dermatology | Admitting: Dermatology

## 2023-11-26 ENCOUNTER — Other Ambulatory Visit: Payer: Self-pay | Admitting: Dermatology

## 2023-11-26 ENCOUNTER — Ambulatory Visit
Admission: RE | Admit: 2023-11-26 | Discharge: 2023-11-26 | Disposition: A | Discharge status: Home / Self Care | Attending: Dermatology | Admitting: Dermatology

## 2023-11-26 DIAGNOSIS — D869 Sarcoidosis, unspecified: Secondary | ICD-10-CM

## 2023-11-28 ENCOUNTER — Ambulatory Visit: Payer: Self-pay

## 2023-11-28 ENCOUNTER — Encounter: Payer: Self-pay | Admitting: Family Medicine

## 2023-11-28 ENCOUNTER — Ambulatory Visit: Admitting: Family Medicine

## 2023-11-28 VITALS — BP 144/75 | HR 81 | Resp 14 | Ht 67.0 in | Wt 164.2 lb

## 2023-11-28 DIAGNOSIS — R10A1 Flank pain, right side: Secondary | ICD-10-CM | POA: Diagnosis not present

## 2023-11-28 LAB — POCT URINALYSIS DIPSTICK
Bilirubin, UA: NEGATIVE
Blood, UA: NEGATIVE
Glucose, UA: NEGATIVE
Ketones, UA: NEGATIVE
Leukocytes, UA: NEGATIVE
Nitrite, UA: NEGATIVE
Protein, UA: NEGATIVE
Spec Grav, UA: 1.01 (ref 1.010–1.025)
Urobilinogen, UA: 0.2 U/dL
pH, UA: 6.5 (ref 5.0–8.0)

## 2023-11-28 MED ORDER — METHYLPREDNISOLONE 4 MG PO TBPK: ORAL_TABLET | ORAL | 0 refills | Status: DC

## 2023-11-28 NOTE — Telephone Encounter (Signed)
 noted

## 2023-11-28 NOTE — Progress Notes (Signed)
 Established patient visit   [name]   DOB: [birth year]   53 y.o. Female  MRN: [id number] Visit Date: 11/28/2023  Today's healthcare provider: LAURAINE LOISE BUOY, DO   Chief Complaint  Patient presents with   Flank Pain    Constant right side rib pain, hurts more when taking a deep breath ongoing 1 week. Denies of injuries. No otc medication to help with relieve   Subjective    Chest Pain  Pertinent negatives include no cough or fever.   Caroline Mullen is a 53 year old female with lupus who presents with severe right flank rib pain.  She has been experiencing severe right flank rib pain for about a week, described as 'really bad' and comparable to the pain during a C-section. The pain is exacerbated by sneezing, yawning, and deep breaths, causing her to flinch and avoid these actions. No urinary symptoms such as dysuria or increased frequency. She acknowledges recent dehydration due to not drinking fluids at a party the previous day. No skin changes have been observed in the area of pain.  She recently underwent a chest x-ray due to a rash under her armpits, which was being evaluated by a dermatologist for sarcoidosis. The x-ray was normal.  She has not taken any medication for the pain due to a history of a bleeding ulcer last year, which she attributes to excessive ibuprofen use for lupus-related aches and inflammation. She has been cautious with NSAIDs since then. She was on a steroid pack in September for a foot issue and took meloxicam  briefly but was hesitant due to her ulcer history.  No fever, chills, cough, or recent illness. She reports increased sneezing due to fall allergies but has not been able to sneeze fully due to the pain.     Medications: Outpatient Medications Prior to Visit  Medication Sig   ALPRAZolam  (XANAX ) 0.5 MG tablet Take 1 tablet (0.5 mg total) by mouth 2 (two) times daily as needed for anxiety.   leflunomide (ARAVA) 20 MG tablet  Take 20 mg by mouth daily.   levocetirizine (XYZAL ) 5 MG tablet TAKE 1 TABLET(5 MG) BY MOUTH DAILY AS NEEDED   levothyroxine  (SYNTHROID ) 100 MCG tablet Take by mouth.   Multiple Vitamin (MULTIVITAMIN) capsule Take by mouth.   Probiotic Product (PROBIOTIC PEARLS WOMENS PO) Take 100 Billion Cells by mouth as needed.   rizatriptan  (MAXALT -MLT) 10 MG disintegrating tablet DISSOLVE ONE TABLET BY MOUTH AS NEEDED FOR MIGRAINE. MAY REPEAT DOSE IN 2 HOURS IF NEEDED   SUMAtriptan  (IMITREX ) 25 MG tablet Take 1 tablet (25 mg total) by mouth every 2 (two) hours as needed for migraine.   tacrolimus (PROTOPIC) 0.1 % ointment Apply topically 2 (two) times daily.   [DISCONTINUED] DULoxetine  (CYMBALTA ) 20 MG capsule Take 1 capsule (20 mg total) by mouth daily.   [DISCONTINUED] meloxicam  (MOBIC ) 15 MG tablet Take 1 tablet (15 mg total) by mouth daily.   [DISCONTINUED] methylPREDNISolone  (MEDROL  DOSEPAK) 4 MG TBPK tablet 6 day dose pack - take as directed   No facility-administered medications prior to visit.    Review of Systems  Constitutional:  Negative for chills and fever.  HENT:  Positive for sneezing.   Respiratory:  Negative for cough.   Genitourinary:  Positive for flank pain (right flank, along lower ribs). Negative for dysuria, frequency and urgency.        Objective    BP (!) 144/75   Pulse 81   Resp  14   Ht 5' 7 (1.702 m)   Wt 164 lb 3.2 oz (74.5 kg)   SpO2 98%   BMI 25.72 kg/m     Physical Exam Vitals and nursing note reviewed.  Constitutional:      General: She is not in acute distress.    Appearance: Normal appearance.  HENT:     Head: Normocephalic and atraumatic.  Eyes:     General: No scleral icterus.    Conjunctiva/sclera: Conjunctivae normal.  Cardiovascular:     Rate and Rhythm: Normal rate.  Pulmonary:     Effort: Pulmonary effort is normal.  Musculoskeletal:     Thoracic back: Spasms and tenderness present.       Back:  Neurological:     Mental Status: She  is alert and oriented to person, place, and time. Mental status is at baseline.  Psychiatric:        Mood and Affect: Mood normal.        Behavior: Behavior normal.      Results for orders placed or performed in visit on 11/28/23  POCT urinalysis dipstick  Result Value Ref Range   Color, UA yellow    Clarity, UA clear    Glucose, UA Negative Negative   Bilirubin, UA Negative    Ketones, UA Negative    Spec Grav, UA 1.010 1.010 - 1.025   Blood, UA Negative    pH, UA 6.5 5.0 - 8.0   Protein, UA Negative Negative   Urobilinogen, UA 0.2 0.2 or 1.0 E.U./dL   Nitrite, UA Negative    Leukocytes, UA Negative Negative   Appearance     Odor      Assessment & Plan    Right flank pain -     POCT urinalysis dipstick -     methylPREDNISolone ; 6 day dose pack - take as directed  Dispense: 1 each; Refill: 0   Severe right flank pain likely musculoskeletal due to absence of systemic symptoms, urinary symptoms and pain location. - Urine analysis negative for urinary tract infection. - Prescribe oral steroid due to history of gastric ulcer r/t overuse of NSAID.     Return if symptoms worsen or fail to improve.      I discussed the assessment and treatment plan with the patient  The patient was provided an opportunity to ask questions and all were answered. The patient agreed with the plan and demonstrated an understanding of the instructions.   The patient was advised to call back or seek an in-person evaluation if the symptoms worsen or if the condition fails to improve as anticipated.    LAURAINE LOISE BUOY, DO  [facility] [phone] (phone) [phone] (fax)  [facility]

## 2023-11-28 NOTE — Telephone Encounter (Signed)
 FYI Only or Action Required?: FYI only for provider: appointment scheduled on 11/28/23.  Patient was last seen in primary care on 09/16/2023 by Myrla Jon HERO, MD.  Called Nurse Triage reporting Pain.  Symptoms began a week ago.  Interventions attempted: Rest, hydration, or home remedies.  Symptoms are: unchanged.  Triage Disposition: See Physician Within 24 Hours  Patient/caregiver understands and will follow disposition?: Yes    Copied from CRM #[id number]. Topic: Clinical - Red Word Triage >> Nov 28, 2023 10:05 AM Sophia H wrote: Red Word that prompted transfer to Nurse Triage:   Constant pain on right side under rib, hurts more when taking a deep breath. Dealing with this for the last week or so. Reason for Disposition  MODERATE pain (e.g., interferes with normal activities or awakens from sleep)  Answer Assessment - Initial Assessment Questions Additional info: History of Lupus.  Sarcoidosis-Chest Xray on 11/26/23 resulted no active cardiopulmonary disease.    1. LOCATION: Where does it hurt? (e.g., left, right)     Right side just under ribs 2. ONSET: When did the pain start?     One week ago  3. SEVERITY: How bad is the pain? (e.g., Scale 1-10; mild, moderate, or severe)     Varies can go up to 8/10 4. PATTERN: Does the pain come and go, or is it constant?      Constant  5. CAUSE: What do you think is causing the pain?     Thought pulled muscle but this has been ongoing for one week.  6. OTHER SYMPTOMS:  Do you have any other symptoms? (e.g., fever, abdomen pain, vomiting, leg weakness, burning with urination, blood in urine)     Pain worse with deep breath. Denies all other symptoms. Speaking in full sentences on call.  Protocols used: Flank Pain-A-AH

## 2023-11-29 ENCOUNTER — Encounter: Payer: Self-pay | Admitting: Podiatry

## 2023-11-29 ENCOUNTER — Ambulatory Visit: Admitting: Podiatry

## 2023-11-29 VITALS — Ht 67.0 in | Wt 164.2 lb

## 2023-11-29 DIAGNOSIS — M7741 Metatarsalgia, right foot: Secondary | ICD-10-CM

## 2023-11-29 NOTE — Progress Notes (Signed)
   Chief Complaint  Patient presents with   Foot Pain    Pt is here to f/u on left foot she states the pain is still there but not as intense as before, wearing oofos around the house and medication prescribed help a lot. No other complaints.    HPI: 53 y.o. female presenting today for follow-up evaluation of pain and tenderness associated to the left forefoot.  Ongoing for a few months now.  Idiopathic onset.  She does admit to walking around the house barefoot the majority of the day.  Past Medical History:  Diagnosis Date   Allergy    Morphine  & seasonal allergies   Anxiety    Connective tissue disease    on Plaquenil    NO LONGER ON MEDS NO PROBLEMS X 6 YEARS   Graves disease    Interstitial granulomatous dermatitis    IRRITABLE BOWEL SYNDROME, HX OF 04/22/2006   Qualifier: History of  By: Randeen MD, Laine Caldron    Lupus    Migraine    Sleep apnea 2022   Maybe? Not sure   Thyroid  nodule 02/19/2011    Past Surgical History:  Procedure Laterality Date   ABDOMINAL HYSTERECTOMY     fibroid uterine tumors supracervical   BREAST REDUCTION SURGERY Bilateral 07/2021   BREAST SURGERY  07/2021   Reduction   CESAREAN SECTION     EYE SURGERY  5/21   HEMORRHOID BANDING     KNEE ARTHROSCOPY     SHOULDER SURGERY  1998   THYROIDECTOMY N/A 08/08/2015   Procedure: THYROIDECTOMY;  Surgeon: Deward Argue, MD;  Location: [facility] ORS;  Service: ENT;  Laterality: N/A;   TUBAL LIGATION      Allergies  Allergen Reactions   Morphine Hives    REACTION: rash   Estradiol-Norethindrone Acet     Other Reaction(s): swelling knees   Morphine Sulfate Rash     Physical Exam: General: The patient is alert and oriented x3 in no acute distress.  Dermatology: Skin is warm, dry and supple bilateral lower extremities.   Vascular: Palpable pedal pulses bilaterally. Capillary refill within normal limits.  No appreciable edema.  No erythema.  Neurological: Grossly intact via light  touch  Musculoskeletal Exam: Generalized tenderness throughout palpation to the right forefoot; improved  Radiographic Exam RT foot 10/08/2023:  Normal osseous mineralization. Joint spaces preserved.  No fractures or osseous irregularities noted.  Impression: Negative  Assessment/Plan of Care: 1.  Metatarsalgia right forefoot  -Patient evaluated.  Overall improvement -She does admit to walking around the house the majority of the day barefoot on hardwood floors.  She says that the pain is alleviated significantly when she wears her [place] tennis shoes. - Continue to advise against going barefoot.  Recommend good supportive tennis shoes or slides around the house - Continue meloxicam  15 mg daily PRN -Return to clinic PRN       Thresa EMERSON Sar, DPM [facility]  Dr. Thresa EMERSON Sar, DPM    [address] [place], [place] [address]                Office [phone]  Fax [phone]

## 2023-12-09 ENCOUNTER — Ambulatory Visit: Payer: Self-pay

## 2023-12-09 NOTE — Telephone Encounter (Signed)
 FYI Only or Action Required?: FYI only for provider: appointment scheduled on 12/10/23.  Patient was last seen in primary care on 11/28/2023 by Donzella Lauraine SAILOR, DO.  Called Nurse Triage reporting Chest Pain and Flank Pain.  Symptoms began several weeks ago.  Interventions attempted: Prescription medications: methylPREDNISolone ; 6 day dose pack  and Rest, hydration, or home remedies.  Symptoms are: gradually worsening.  Triage Disposition: See PCP When Office is Open (Within 3 Days)  Patient/caregiver understands and will follow disposition?: Yes Reason for Disposition  [1] MILD pain (i.e., scale 1-3; does not interfere with normal activities) AND [2] present > 3 days  Answer Assessment - Initial Assessment Questions Patient was seen by another provider on 10/30 and was given Prednisone  for 6 days, found mild to moderate relief taking that but since being off its worsening. Patient is requesting to see PCP, Dr. B.   1. LOCATION: Where does it hurt? (e.g., left, right)     Primarily right side, but hurts on left side. Rib cage and wraps around to flank  2. ONSET: When did the pain start?     Couple weeks ago  3. SEVERITY: How bad is the pain? (e.g., Scale 1-10; mild, moderate, or severe)     10/10 if needing to sneeze, not so bothersome if sitting and relaxing  4. PATTERN: Does the pain come and go, or is it constant?      Constant but exacerbated with sneezing and deep breaths  5. CAUSE: What do you think is causing the pain?     Unsure, denies recent injury  6. OTHER SYMPTOMS:  Do you have any other symptoms? (e.g., fever, abdomen pain, vomiting, leg weakness, burning with urination, blood in urine)     Denies  Protocols used: Flank Pain-A-AH  Copied from CRM #[id number]. Topic: Clinical - Red Word Triage >> Dec 09, 2023  9:47 AM Tobias CROME wrote: Red Word that prompted transfer to Nurse Triage: patient states it hurts when she breathes, coughs, hurts at bottom of  ribcage, flank pain, pain worsening   Requesting appointment with Dr. Bacigalupo

## 2023-12-09 NOTE — Telephone Encounter (Signed)
 Noted

## 2023-12-10 ENCOUNTER — Encounter: Payer: Self-pay | Admitting: Family Medicine

## 2023-12-10 ENCOUNTER — Ambulatory Visit (INDEPENDENT_AMBULATORY_CARE_PROVIDER_SITE_OTHER): Admitting: Family Medicine

## 2023-12-10 VITALS — BP 128/76 | HR 71 | Ht 67.0 in | Wt 165.0 lb

## 2023-12-10 DIAGNOSIS — T148XXA Other injury of unspecified body region, initial encounter: Secondary | ICD-10-CM

## 2023-12-10 DIAGNOSIS — R10A1 Flank pain, right side: Secondary | ICD-10-CM | POA: Diagnosis not present

## 2023-12-10 LAB — POCT URINALYSIS DIPSTICK
Bilirubin, UA: NEGATIVE
Blood, UA: NEGATIVE
Glucose, UA: NEGATIVE
Ketones, UA: NEGATIVE
Leukocytes, UA: NEGATIVE
Nitrite, UA: NEGATIVE
Protein, UA: NEGATIVE
Spec Grav, UA: 1.01 (ref 1.010–1.025)
Urobilinogen, UA: 0.2 U/dL
pH, UA: 6.5 (ref 5.0–8.0)

## 2023-12-10 MED ORDER — METHOCARBAMOL 500 MG PO TABS: 500.0000 mg | ORAL_TABLET | Freq: Three times a day (TID) | ORAL | 1 refills | Status: AC | PRN

## 2023-12-10 NOTE — Progress Notes (Signed)
 Acute visit   [name]   DOB: Nov 14, 1970   53 y.o. Female  MRN: [id number] PCP: Myrla Jon HERO, MD   Chief Complaint  Patient presents with   Follow-up    Patient is present due to ongoing rib/flank pain. She was last seen in office on 11/28/23. POC UA a completed and prednisone  6 day dose pack was provided. RN triage completed yesterday where patient reported she found mild to moderate relief taking that but since being off its worsening. Pain is located primarily right side, but hurts on left side, rib cage and wraps around to flank. Pain is 10/10 if needing to sneeze, not so bothersome if sitting and relaxing and it is constant but worse when sneezing   Subjective    Discussed the use of AI scribe software for clinical note transcription with the patient, who gave verbal consent to proceed.  History of Present Illness   Caroline Mullen is a 53 year old female with lupus who presents with chest pain and a rash under her armpits.  She has experienced chest pain for over a month, primarily on the right side but also on both sides. The pain intensifies with deep breaths, sneezing, or coughing, and worsens with movement, especially when turning. She describes it as intense and notes that it began before the rash appeared.  A rash under her armpits was biopsied but has not healed. The biopsy results were unusual, leading to a chest x-ray to rule out sarcoidosis, which was negative.  She completed a six-day course of prednisone , which temporarily alleviated the pain, but symptoms returned after the course. She avoids NSAIDs due to a previous ulcer. Fall allergies have increased her sneezing, potentially aggravating her condition.        Review of Systems  Objective    BP 128/76 (BP Location: Left Arm, Patient Position: Sitting, Cuff Size: Normal)   Pulse 71   Ht 5' 7 (1.702 m)   Wt 165 lb (74.8 kg)   SpO2 100%   BMI 25.84 kg/m  Physical Exam Vitals  reviewed.  Constitutional:      General: She is not in acute distress.    Appearance: She is well-developed.  HENT:     Head: Normocephalic and atraumatic.  Eyes:     General: No scleral icterus.    Conjunctiva/sclera: Conjunctivae normal.  Cardiovascular:     Rate and Rhythm: Normal rate and regular rhythm.  Pulmonary:     Effort: Pulmonary effort is normal. No respiratory distress.     Breath sounds: Normal breath sounds. No wheezing.  Abdominal:     General: There is no distension.     Palpations: Abdomen is soft.     Tenderness: There is no abdominal tenderness. There is no right CVA tenderness or left CVA tenderness.  Musculoskeletal:     Comments: TTP over R flank musculature. ROM intact  Skin:    General: Skin is warm and dry.     Findings: No rash.  Neurological:     Mental Status: She is alert and oriented to person, place, and time.  Psychiatric:        Behavior: Behavior normal.       Results for orders placed or performed in visit on 12/10/23  POCT Urinalysis Dipstick  Result Value Ref Range   Color, UA yellow    Clarity, UA clear    Glucose, UA Negative Negative   Bilirubin, UA negative  Ketones, UA negative    Spec Grav, UA 1.010 1.010 - 1.025   Blood, UA negative    pH, UA 6.5 5.0 - 8.0   Protein, UA Negative Negative   Urobilinogen, UA 0.2 0.2 or 1.0 E.U./dL   Nitrite, UA negative    Leukocytes, UA Negative Negative   Appearance     Odor      Assessment & Plan     Problem List Items Addressed This Visit   None Visit Diagnoses       Right flank pain    -  Primary   Relevant Orders   POCT Urinalysis Dipstick (Completed)     Muscle strain               Right flank musculoskeletal pain Chronic right flank pain exacerbated by deep breathing and sneezing, likely musculoskeletal in origin. Pain is more pronounced on the right side but present bilaterally. Previous chest x-ray and urinalysis were normal, ruling out pulmonary and renal  causes. Pain improved with prednisone  but recurred. Differential includes musculoskeletal strain, possibly aggravated by sneezing. Examination suggests involvement of the intercostal muscles, likely due to posture and overuse. - Prescribed methocarbamol (Robaxin) as needed, up to three times daily, for muscle relaxation. - Advised gentle massage therapy, avoiding deep tissue techniques. - Recommended heat application and massage to alleviate muscle tension. - Suggested icing post-massage to reduce inflammation. - Allowed occasional use of ibuprofen for inflammation, with caution due to history of NSAID-induced gastric ulcer. - Encouraged hydration and rest.  History of NSAID-induced gastric ulcer NSAID-induced gastric ulcer limits the use of NSAIDs for pain management. She is cautious about NSAID use due to past ulcer history. - Advised against regular NSAID use due to risk of ulcer recurrence. - Allowed occasional use of ibuprofen with caution, ensuring adequate food intake.        Meds ordered this encounter  Medications   methocarbamol (ROBAXIN) 500 MG tablet    Sig: Take 1 tablet (500 mg total) by mouth every 8 (eight) hours as needed for muscle spasms.    Dispense:  30 tablet    Refill:  1     No follow-ups on file.      Jon Eva, MD  [facility] [phone] (phone) [phone] (fax)  [facility]

## 2023-12-23 ENCOUNTER — Other Ambulatory Visit: Payer: Self-pay

## 2023-12-23 ENCOUNTER — Emergency Department

## 2023-12-23 ENCOUNTER — Emergency Department
Admission: EM | Admit: 2023-12-23 | Discharge: 2023-12-23 | Disposition: A | Discharge status: Home / Self Care | Attending: Emergency Medicine | Admitting: Emergency Medicine

## 2023-12-23 DIAGNOSIS — R10A1 Flank pain, right side: Secondary | ICD-10-CM | POA: Insufficient documentation

## 2023-12-23 LAB — CBC
HCT: 43.7 % (ref 36.0–46.0)
Hemoglobin: 14.5 g/dL (ref 12.0–15.0)
MCH: 29.7 pg (ref 26.0–34.0)
MCHC: 33.2 g/dL (ref 30.0–36.0)
MCV: 89.4 fL (ref 80.0–100.0)
Platelets: 296 K/uL (ref 150–400)
RBC: 4.89 MIL/uL (ref 3.87–5.11)
RDW: 12.7 % (ref 11.5–15.5)
WBC: 5.8 K/uL (ref 4.0–10.5)
nRBC: 0 % (ref 0.0–0.2)

## 2023-12-23 LAB — URINALYSIS, ROUTINE W REFLEX MICROSCOPIC
Bilirubin Urine: NEGATIVE
Glucose, UA: NEGATIVE mg/dL
Hgb urine dipstick: NEGATIVE
Ketones, ur: NEGATIVE mg/dL
Leukocytes,Ua: NEGATIVE
Nitrite: NEGATIVE
Protein, ur: NEGATIVE mg/dL
Specific Gravity, Urine: 1.004 — ABNORMAL LOW (ref 1.005–1.030)
pH: 6 (ref 5.0–8.0)

## 2023-12-23 LAB — COMPREHENSIVE METABOLIC PANEL WITH GFR
ALT: 24 U/L (ref 0–44)
AST: 20 U/L (ref 15–41)
Albumin: 4.3 g/dL (ref 3.5–5.0)
Alkaline Phosphatase: 78 U/L (ref 38–126)
Anion gap: 10 (ref 5–15)
BUN: 10 mg/dL (ref 6–20)
CO2: 27 mmol/L (ref 22–32)
Calcium: 9.8 mg/dL (ref 8.9–10.3)
Chloride: 102 mmol/L (ref 98–111)
Creatinine, Ser: 0.81 mg/dL (ref 0.44–1.00)
GFR, Estimated: >60 mL/min (ref >60)
Glucose, Bld: 114 mg/dL — ABNORMAL HIGH (ref 70–99)
Potassium: 3.6 mmol/L (ref 3.5–5.1)
Sodium: 139 mmol/L (ref 135–145)
Total Bilirubin: 0.7 mg/dL (ref 0.0–1.2)
Total Protein: 7.4 g/dL (ref 6.5–8.1)

## 2023-12-23 LAB — LIPASE, BLOOD: Lipase: 43 U/L (ref 11–51)

## 2023-12-23 MED ORDER — KETOROLAC TROMETHAMINE 30 MG/ML IJ SOLN
30.0000 mg | Freq: Once | INTRAMUSCULAR | Status: AC
  Administered 2023-12-23: 30 mg via INTRAMUSCULAR
  Filled 2023-12-23: qty 1

## 2023-12-23 MED ORDER — PREDNISONE 10 MG PO TABS: ORAL_TABLET | ORAL | 0 refills | Status: AC

## 2023-12-23 MED ORDER — NAPROXEN 500 MG PO TABS
500.0000 mg | ORAL_TABLET | Freq: Two times a day (BID) | ORAL | 2 refills | Status: AC
Start: 2023-12-23 — End: ?

## 2023-12-23 MED ORDER — PREDNISONE 20 MG PO TABS
60.0000 mg | ORAL_TABLET | Freq: Once | ORAL | Status: AC
  Administered 2023-12-23: 60 mg via ORAL
  Filled 2023-12-23: qty 3

## 2023-12-23 NOTE — ED Provider Notes (Signed)
 [facility] Provider Note    Event Date/Time   First MD Initiated Contact with Patient 12/23/23 1457     (approximate)   History   Right flank pain   HPI  Caroline Mullen is a 53 y.o. female with a history of lupus who presents with complaints of right flank pain.  Patient reports this has been intermittently hurting over the last 3 to 4 weeks.  She reports she was treated with prednisone  at 1 point which helped significantly.  She presents to the emergency department today because pain seems to have worsened in the last 24 hours.  Denies injury to the area.  No rash.  No fevers chills.  No dysuria.  No hematuria.     Physical Exam   Triage Vital Signs: ED Triage Vitals  Encounter Vitals Group     BP 12/23/23 1301 (!) 148/87     Girls Systolic BP Percentile --      Girls Diastolic BP Percentile --      Boys Systolic BP Percentile --      Boys Diastolic BP Percentile --      Pulse Rate 12/23/23 1301 87     Resp 12/23/23 1301 16     Temp 12/23/23 1301 98.4 F (36.9 C)     Temp Source 12/23/23 1301 Oral     SpO2 12/23/23 1301 98 %     Weight 12/23/23 1256 74.8 kg (164 lb 14.5 oz)     Height --      Head Circumference --      Peak Flow --      Pain Score 12/23/23 1255 3     Pain Loc --      Pain Education --      Exclude from Growth Chart --     Most recent vital signs: Vitals:   12/23/23 1651 12/23/23 1652  BP:  131/83  Pulse:  72  Resp:  16  Temp: 98.8 F (37.1 C)   SpO2:  98%     General: Awake, no distress.  CV:  Good peripheral perfusion.  Resp:  Normal effort.  Abd:  No distention.  Soft, no significant abdominal tenderness palpation, Other:  Tenderness to the posterior right abdomen, no rash   ED Results / Procedures / Treatments   Labs (all labs ordered are listed, but only abnormal results are displayed) Labs Reviewed  COMPREHENSIVE METABOLIC PANEL WITH GFR - Abnormal; Notable for the following components:       Result Value   Glucose, Bld 114 (*)    All other components within normal limits  URINALYSIS, ROUTINE W REFLEX MICROSCOPIC - Abnormal; Notable for the following components:   Color, Urine YELLOW (*)    APPearance CLEAR (*)    Specific Gravity, Urine 1.004 (*)    All other components within normal limits  LIPASE, BLOOD  CBC     EKG     RADIOLOGY CT abdomen pelvis viewed interpreted by me, no clear ureterolithiasis, radiology reports no acute abnormality    PROCEDURES:  Critical Care performed:   Procedures   MEDICATIONS ORDERED IN ED: Medications  ketorolac  (TORADOL ) 30 MG/ML injection 30 mg (30 mg Intramuscular Given 12/23/23 1643)  predniSONE  (DELTASONE ) tablet 60 mg (60 mg Oral Given 12/23/23 1716)     IMPRESSION / MDM / ASSESSMENT AND PLAN / ED COURSE  I reviewed the triage vital signs and the nursing notes. Patient's presentation is most consistent with acute presentation with potential  threat to life or bodily function.  Patient presents with right flank pain as detailed above, differential includes ureterolithiasis, musculoskeletal pain, appendicitis  Will treat with IM Toradol , obtain CT renal stone study, obtain labs reevaluate  Urinalysis is reassuring lab work is unremarkable, normal white blood cell count  CT scan without acute abnormality.  Patient is feeling much better after Toradol   Question possible lupus flare, musculoskeletal pain will treat with prednisone  course, she will follow-up with her rheumatologist.        FINAL CLINICAL IMPRESSION(S) / ED DIAGNOSES   Final diagnoses:  Right flank pain     Rx / DC Orders   ED Discharge Orders          Ordered    predniSONE  (DELTASONE ) 10 MG tablet  Daily        12/23/23 1710    naproxen  (NAPROSYN ) 500 MG tablet  2 times daily with meals        12/23/23 1717             Note:  This document was prepared using Dragon voice recognition software and may include unintentional  dictation errors.   Arlander Charleston, MD 12/23/23 [phone]

## 2023-12-23 NOTE — ED Triage Notes (Signed)
 Right flank pain x several weeks. Seen by PCP. Initially treated with prednisone  and muscle relaxer. No improvements.  Denies N/VD  Pain is worse with laying down and deep breath.  AAOx3. Skin warm and dry. NAD  Patient points to pain to right thoracic back and RUQ abd.

## 2024-01-10 ENCOUNTER — Ambulatory Visit: Payer: Self-pay

## 2024-01-10 NOTE — Telephone Encounter (Signed)
 Needs appointment  or go to urgent care if no appts available.

## 2024-01-10 NOTE — Telephone Encounter (Signed)
 FYI Only or Action Required?: Action required by provider: request for appointment, clinical question for provider, and update on patient condition.  Patient was last seen in primary care on 12/10/2023 by Myrla Jon HERO, MD.  Called Nurse Triage reporting Flank Pain.  Symptoms began 1-2 months ago.  Interventions attempted: OTC medications: Tylenol , Prescription medications: prednisone , and Other: ED visit on 12/23/23.  Symptoms are: unchanged.  Triage Disposition: See PCP When Office is Open (Within 3 Days)  Patient/caregiver understands and will follow disposition?: Yes, but will wait                                  1. LOCATION: Where does it hurt? (e.g., left, right)     Right flank, radiates to both front and back sides 2. ONSET: When did the pain start?     Ongoing for 1-2 months 3. SEVERITY: How bad is the pain? (e.g., Scale 1-10; mild, moderate, or severe)     Rates pain a 10, when present  4. PATTERN: Does the pain come and go, or is it constant?      Intermittent, comes when taking a deep breath or sneezing, not very bothersome at rest 5. CAUSE: What do you think is causing the pain?     Unsure, possibly some sort of inflammation or nerve issue, per patient 6. OTHER SYMPTOMS:  Do you have any other symptoms? (e.g., fever, abdomen pain, vomiting, leg weakness, burning with urination, blood in urine)     Denies chest pain, denies difficulty breathing, denies urinary issues, denies vomiting, denies weakness/numbness in legs, denies urinary retention    Patient has been dealing with intermittent right flank pain for 1-2 months. Patient went to ED for symptoms on 12/23/23. Patient was prescribed prednisone  at discharge. Patient stated prednisone  resolved symptoms, but she has been without it since last week and symptoms have started to return. Patient stated she would only like to see her PCP for these symptoms. No availability with  PCP until end of January. Please advise on scheduling and if additional prednisone  may be prescribed, as patient mentioned she is concerned she will have to go back to the ED if pain intensifies over the weekend. Patient would like a call back.   Copied from CRM #[id number]. Topic: Clinical - Red Word Triage >> Jan 10, 2024  9:37 AM Everette C wrote: [facility] that prompted transfer to Nurse Triage: The patient shares that they are having flank and respiratory discomfort  Reason for Disposition  [1] MILD pain (i.e., scale 1-3; does not interfere with normal activities) AND [2] present > 3 days  Protocols used: Flank Pain-A-AH

## 2024-02-18 ENCOUNTER — Ambulatory Visit: Admitting: Family Medicine

## 2024-04-30 ENCOUNTER — Encounter: Admitting: Family Medicine
